# Patient Record
Sex: Female | Born: 1970 | Race: Black or African American | Hispanic: No | Marital: Married | Smoking: Never smoker
Health system: Southern US, Community
[De-identification: ages and names within clinical notes are randomized; demographics above are authoritative.]

## PROBLEM LIST (undated history)

## (undated) DIAGNOSIS — D573 Sickle-cell trait: Secondary | ICD-10-CM

## (undated) DIAGNOSIS — E785 Hyperlipidemia, unspecified: Secondary | ICD-10-CM

## (undated) DIAGNOSIS — D649 Anemia, unspecified: Secondary | ICD-10-CM

## (undated) DIAGNOSIS — T7840XA Allergy, unspecified, initial encounter: Secondary | ICD-10-CM

## (undated) DIAGNOSIS — Z9289 Personal history of other medical treatment: Secondary | ICD-10-CM

## (undated) DIAGNOSIS — N920 Excessive and frequent menstruation with regular cycle: Secondary | ICD-10-CM

## (undated) DIAGNOSIS — I714 Abdominal aortic aneurysm, without rupture, unspecified: Secondary | ICD-10-CM

## (undated) DIAGNOSIS — I10 Essential (primary) hypertension: Secondary | ICD-10-CM

## (undated) DIAGNOSIS — M1990 Unspecified osteoarthritis, unspecified site: Secondary | ICD-10-CM

## (undated) DIAGNOSIS — R51 Headache: Secondary | ICD-10-CM

## (undated) DIAGNOSIS — R569 Unspecified convulsions: Secondary | ICD-10-CM

## (undated) HISTORY — DX: Unspecified osteoarthritis, unspecified site: M19.90

## (undated) HISTORY — DX: Excessive and frequent menstruation with regular cycle: N92.0

## (undated) HISTORY — DX: Abdominal aortic aneurysm, without rupture, unspecified: I71.40

## (undated) HISTORY — DX: Essential (primary) hypertension: I10

## (undated) HISTORY — DX: Sickle-cell trait: D57.3

## (undated) HISTORY — PX: WISDOM TOOTH EXTRACTION: SHX21

## (undated) HISTORY — DX: Unspecified convulsions: R56.9

## (undated) HISTORY — DX: Hyperlipidemia, unspecified: E78.5

## (undated) HISTORY — DX: Allergy, unspecified, initial encounter: T78.40XA

---

## 1998-08-08 ENCOUNTER — Inpatient Hospital Stay (HOSPITAL_COMMUNITY): Admission: AD | Admit: 1998-08-08 | Discharge: 1998-08-10 | Payer: Self-pay | Admitting: Obstetrics and Gynecology

## 1998-11-13 ENCOUNTER — Encounter: Payer: Self-pay | Admitting: Emergency Medicine

## 1998-11-13 ENCOUNTER — Emergency Department (HOSPITAL_COMMUNITY): Admission: EM | Admit: 1998-11-13 | Discharge: 1998-11-13 | Payer: Self-pay | Admitting: Emergency Medicine

## 2009-04-21 ENCOUNTER — Ambulatory Visit: Payer: Self-pay | Admitting: Diagnostic Radiology

## 2010-02-18 ENCOUNTER — Emergency Department (HOSPITAL_BASED_OUTPATIENT_CLINIC_OR_DEPARTMENT_OTHER): Admission: EM | Admit: 2010-02-18 | Discharge: 2009-04-21 | Payer: Self-pay | Admitting: Emergency Medicine

## 2010-06-03 LAB — CBC
HCT: 36.3 % (ref 36.0–46.0)
Hemoglobin: 12.1 g/dL (ref 12.0–15.0)
MCHC: 33.5 g/dL (ref 30.0–36.0)
MCV: 78.8 fL (ref 78.0–100.0)
Platelets: 292 10*3/uL (ref 150–400)
RBC: 4.61 MIL/uL (ref 3.87–5.11)
RDW: 16 % — ABNORMAL HIGH (ref 11.5–15.5)
WBC: 5.2 10*3/uL (ref 4.0–10.5)

## 2010-06-03 LAB — BASIC METABOLIC PANEL
BUN: 14 mg/dL (ref 6–23)
CO2: 29 mEq/L (ref 19–32)
Calcium: 8.4 mg/dL (ref 8.4–10.5)
Chloride: 106 mEq/L (ref 96–112)
Creatinine, Ser: 0.9 mg/dL (ref 0.4–1.2)
GFR calc Af Amer: >60 mL/min (ref >60)
GFR calc non Af Amer: >60 mL/min (ref >60)
Glucose, Bld: 86 mg/dL (ref 70–99)
Potassium: 3.5 mEq/L (ref 3.5–5.1)
Sodium: 141 mEq/L (ref 135–145)

## 2010-06-03 LAB — POCT CARDIAC MARKERS
CKMB, poc: <1 ng/mL — ABNORMAL LOW (ref 1.0–8.0)
CKMB, poc: <1 ng/mL — ABNORMAL LOW (ref 1.0–8.0)
Myoglobin, poc: 27 ng/mL (ref 12–200)
Myoglobin, poc: 51.3 ng/mL (ref 12–200)
Troponin i, poc: <0.05 ng/mL (ref 0.00–0.09)
Troponin i, poc: <0.05 ng/mL (ref 0.00–0.09)

## 2011-02-15 ENCOUNTER — Other Ambulatory Visit: Payer: Self-pay | Admitting: Obstetrics and Gynecology

## 2011-02-15 DIAGNOSIS — Z1231 Encounter for screening mammogram for malignant neoplasm of breast: Secondary | ICD-10-CM

## 2011-03-21 ENCOUNTER — Ambulatory Visit
Admission: RE | Admit: 2011-03-21 | Discharge: 2011-03-21 | Disposition: A | Discharge status: Home / Self Care | Payer: BC Managed Care – PPO | Source: Ambulatory Visit | Attending: Obstetrics and Gynecology | Admitting: Obstetrics and Gynecology

## 2011-03-21 DIAGNOSIS — Z1231 Encounter for screening mammogram for malignant neoplasm of breast: Secondary | ICD-10-CM

## 2012-01-20 ENCOUNTER — Telehealth: Payer: Self-pay | Admitting: Obstetrics and Gynecology

## 2012-01-20 NOTE — Telephone Encounter (Signed)
Tc from pt. Pt c/o heavier menses x 6-8 months. No fever. Pt seen by PCP on yesterday and told has iron level=6. Pt was told to follow up with gyn physician due to heavy menses. Pt was not put on iron supp. AEX sched 02/27/12@9 :45 with AVS. Informed pt to consult with PCP per iron supp recs. Pt voices understanding. Told pt to cb if starts changing >1 pad hourly. Pt agrees.

## 2012-01-20 NOTE — Telephone Encounter (Signed)
Lm on vm to cb per telephone call.  

## 2012-02-07 ENCOUNTER — Telehealth: Payer: Self-pay | Admitting: Obstetrics and Gynecology

## 2012-02-07 NOTE — Telephone Encounter (Signed)
Returned pt's call. LM to return call.   

## 2012-02-08 NOTE — Telephone Encounter (Signed)
Returned pt's call. LM to return call.   

## 2012-02-27 ENCOUNTER — Ambulatory Visit: Payer: Self-pay | Admitting: Obstetrics and Gynecology

## 2012-02-29 ENCOUNTER — Ambulatory Visit (INDEPENDENT_AMBULATORY_CARE_PROVIDER_SITE_OTHER): Payer: BC Managed Care – PPO

## 2012-02-29 VITALS — BP 130/88 | Ht 70.0 in | Wt 186.0 lb

## 2012-02-29 DIAGNOSIS — D649 Anemia, unspecified: Secondary | ICD-10-CM

## 2012-02-29 DIAGNOSIS — N920 Excessive and frequent menstruation with regular cycle: Secondary | ICD-10-CM

## 2012-02-29 DIAGNOSIS — Z01419 Encounter for gynecological examination (general) (routine) without abnormal findings: Secondary | ICD-10-CM

## 2012-02-29 LAB — THYROID PANEL WITH TSH: TSH: 1.336 u[IU]/mL (ref 0.350–4.500)

## 2012-02-29 LAB — IRON AND TIBC
%SAT: 80 % — ABNORMAL HIGH (ref 20–55)
Iron: 340 ug/dL — ABNORMAL HIGH (ref 42–145)

## 2012-02-29 LAB — FERRITIN: Ferritin: 1 ng/mL — ABNORMAL LOW (ref 10–291)

## 2012-02-29 NOTE — Progress Notes (Signed)
Last Pap: 02/2011  WNL: Yes Regular Periods:no Contraception: condoms   Monthly Breast exam:yes Tetanus<75yrs:no Nl.Bladder Function:yes Daily BMs:yes Healthy Diet:yes Calcium:yes Mammogram:yes Date of Mammogram: 02/15/2011 Exercise:yes Have often Exercise: 2 days a week  Seatbelt: yes Abuse at home: no Stressful work:no PCP: Dr.Aguiar  Change in PMH: unchanged Change in AOZ:HYQMVHQIO  C/o  low iron  .Marland Kitchen Subjective:    Catherine Mcdonald is a 41 y.o. female, G2P2002, who presents for an annual exam. C/o heavier periods over last 6months and PCP placed on iron recently and rec'd pt be seen by OB/GYN to further assess.  Pt has a 16y.o. Daughter and 83 y.o. Son; 1st child vaginal and 2nd child c/s.  No recent HSV outbreaks.  She is on medication for [facility], but she doesn't recall exact name.  Pt is has been tired/sluggish, but no dizziness or syncope.  Sometimes wearing pads and a tampon.  No h/o fibroids or polyps or endometriosis.  Pt is a Therapist, music at [employer]  Hgb=6.9 in 11/'13 at PCP.    History   Social History  . Marital Status: Single    Spouse Name: N/A    Number of Children: N/A  . Years of Education: N/A   Social History Main Topics  . Smoking status: Never Smoker   . Smokeless tobacco: Never Used  . Alcohol Use: Yes  . Drug Use: No  . Sexually Active: Yes    Birth Control/ Protection: Condom   Other Topics Concern  . None   Social History Narrative  . None    Menstrual cycle:   LMP: Patient's last menstrual period was 01/23/2012.             The following portions of the patient's history were reviewed and updated as appropriate: allergies, current medications, past family history, past medical history, past social history, past surgical history and problem list.  Review of Systems Pertinent items are noted in HPI. Breast:Negative for breast lump,nipple discharge or nipple retraction Gastrointestinal: Negative for abdominal pain, change in  bowel habits or rectal bleeding Urinary:negative   Objective:    BP 130/88  Ht 5\' 10"  (1.778 m)  Wt 186 lb (84.369 kg)  BMI 26.69 kg/m2  LMP 01/23/2012    Weight:  Wt Readings from Last 1 Encounters:  03/01/12 188 lb (85.276 kg)          BMI: Body mass index is 26.69 kg/(m^2).  General Appearance: Alert, appropriate appearance for age. No acute distress HEENT: Grossly normal Neck / Thyroid: Supple, no masses, thyroid w/ slight enlargement Lungs: clear to auscultation bilaterally Back: No CVA tenderness Breast Exam: No masses or nodes.No dimpling, nipple retraction or discharge. Cardiovascular: Regular rate and rhythm. S1, S2, no murmur Gastrointestinal: Soft, non-tender, no masses or organomegaly Pelvic Exam: External genitalia: normal general appearance Vaginal: normal mucosa without prolapse or lesions and normal without tenderness, induration or masses Cervix: nabothian cysts vs small fibroids on surface of cx Adnexa: normal bimanual exam Uterus: slightly enlarged Rectovaginal: not indicated and not done Lymphatic Exam: Non-palpable nodes in neck, clavicular, axillary, or inguinal regions Skin: no rash or abnormalities Neurologic: Normal gait and speech, no tremor  Psychiatric: Alert and oriented, appropriate affect.  .. Results for orders placed in visit on 02/29/12 (from the past 48 hour(s))  PAP IG W/ RFLX HPV ASCU     Status: Normal   Collection Time   02/29/12 12:07 PM      Component Value Range Comment  Specimen adequacy:     SATISFACTORY.  Endocervical/transformation zone component present.   FINAL DIAGNOSIS:        COMMENTS:     Benign cellular changes are present.   Cytotechnologist:       FERRITIN     Status: Abnormal   Collection Time   02/29/12 12:08 PM      Component Value Range Comment   Ferritin 1 (*) 10 - 291 ng/mL   IRON AND TIBC     Status: Abnormal   Collection Time   02/29/12 12:08 PM      Component Value Range Comment   Iron 340 (*) 42 - 145  ug/dL    UIBC 85 (*) 960 - 454 ug/dL    TIBC 098  119 - 147 ug/dL    %SAT 80 (*) 20 - 55 %   THYROID PANEL WITH TSH     Status: Normal   Collection Time   02/29/12 12:08 PM      Component Value Range Comment   T4, Total 9.1  5.0 - 12.5 ug/dL    T3 Uptake 82.9  56.2 - 37.0 %    Free Thyroxine Index 3.0  1.0 - 3.9    TSH 1.336  0.350 - 4.500 uIU/mL      UPT: Not done   Assessment:    menorrhagia w/ anemia   CHTN  Plan:    pap smear STD screening: declined Thyroid panel, Iron panel. RTO ASAP for pelvic u/s and to see MD r/e further management of menorrhagia and anemia. Continue antihypertensive and f/u PCP r/e HTN Rec'd once her current "Polyiron" supply is out, switch to Floridex 2tsp po bid.  Rexene Edison, CNM 03/01/12 1729 (late entry)

## 2012-03-01 ENCOUNTER — Ambulatory Visit (INDEPENDENT_AMBULATORY_CARE_PROVIDER_SITE_OTHER): Payer: BC Managed Care – PPO

## 2012-03-01 ENCOUNTER — Encounter: Payer: Self-pay | Admitting: Obstetrics and Gynecology

## 2012-03-01 ENCOUNTER — Ambulatory Visit (INDEPENDENT_AMBULATORY_CARE_PROVIDER_SITE_OTHER): Payer: BC Managed Care – PPO | Admitting: Obstetrics and Gynecology

## 2012-03-01 ENCOUNTER — Other Ambulatory Visit: Payer: Self-pay

## 2012-03-01 VITALS — BP 120/72 | Ht 70.0 in | Wt 188.0 lb

## 2012-03-01 DIAGNOSIS — N920 Excessive and frequent menstruation with regular cycle: Secondary | ICD-10-CM

## 2012-03-01 DIAGNOSIS — D649 Anemia, unspecified: Secondary | ICD-10-CM | POA: Insufficient documentation

## 2012-03-01 DIAGNOSIS — D259 Leiomyoma of uterus, unspecified: Secondary | ICD-10-CM

## 2012-03-01 DIAGNOSIS — D219 Benign neoplasm of connective and other soft tissue, unspecified: Secondary | ICD-10-CM

## 2012-03-01 HISTORY — DX: Excessive and frequent menstruation with regular cycle: N92.0

## 2012-03-01 LAB — PAP IG W/ RFLX HPV ASCU

## 2012-03-01 MED ORDER — NORETHINDRONE ACET-ETHINYL EST 1-20 MG-MCG PO TABS: 1.0000 | ORAL_TABLET | Freq: Every day | ORAL | Status: DC

## 2012-03-01 NOTE — Progress Notes (Signed)
Subjective:    Catherine Mcdonald is a 41 y.o. female, G2P2002, who presents for Gyn ultrasound because of Anemia and Menorrhagia  Cycle monthly, 9 days with 7 heavy days, 1 pad every 1 to 2 hours, clots ad 3 cm, no dysmenorrhea. Flow has increased in the last 6 months.  20 yo daughter and 73 yo daughter: SVD 6 lbs 9 oz and C/S 9 lbs 15 oz.  Condoms: satisfied Does not desire other pregnancy. Non-smoker, no DVT, previous BCP user without problem.  The following portions of the patient's history were reviewed and updated as appropriate: allergies, current medications, past family history.  Objective:    LMP 01/23/2012    Weight:  Wt Readings from Last 1 Encounters:  02/29/12 186 lb (84.369 kg)          BMI: There is no height or weight on file to calculate BMI.  ULTRASOUND: Uterus enlarged    Adnexa normal    Endometrium 0.980 mm    Free fluid: no    Other findings:  Enlarged uterus with multiple fibroids. Largest measured;  Fibroid 1: left posterior (myometrial) 3.0 x 2.8 x 3.1 cm Fibroid 2: mid - this appears to be submucosal: color flow is noted 2.2 x 2.1 x 2.1 cm Fibroid 3: Fundus (myometrial) 1.9 x 1.6 x 1.7 cm RTOV: WNLs - resolving CL LTOV: WNLs No CDS fluid.   Assessment:    symptomatic fibroids    Plan:    The following was reviewed with patient:    Benign nature of fibroids as well as unpredictable growth during reproductive years.      Possible fibroid symptoms including: menorrhagia, dysmenorrhea, urinary frequency,       pelvic pain, and back pain.     Expected resolution/improvement of symptoms with menopausal state.    Treatment options: expectant management, NSAIDS, oral contraceptives,       Depo Provera, Uterine Artery Embolization, myomectomy, and hysterectomy.  Surgical approach options were also reviewed including, laparoscopic, robotically assisted and abdominal. Risks, benefits and limitations also discussed.  Choice between total and Supracervical  also discussed with the theoretical benefit of reduced long-term prolapse but with the possible risks of: cyclic bleeding, risk of new growth of cervical fibroid, risk of cervical cancer and need to maintain scheduled Pap smears. Prophylactic BSO discussed with risks and benefits.  After 25  minutes face to face encounter, the patient requests to proceed with trial of BCP    Silverio Lay MD

## 2012-05-12 DIAGNOSIS — Z9289 Personal history of other medical treatment: Secondary | ICD-10-CM

## 2012-05-12 HISTORY — DX: Personal history of other medical treatment: Z92.89

## 2012-05-24 ENCOUNTER — Inpatient Hospital Stay (HOSPITAL_COMMUNITY)
Admission: AD | Admit: 2012-05-24 | Disposition: A | Discharge status: Home / Self Care | Payer: BC Managed Care – PPO | Source: Ambulatory Visit | Attending: Obstetrics and Gynecology | Admitting: Obstetrics and Gynecology

## 2012-05-24 ENCOUNTER — Inpatient Hospital Stay: Admission: AD | Admit: 2012-05-24 | Payer: Self-pay | Source: Ambulatory Visit | Admitting: Obstetrics and Gynecology

## 2012-05-24 ENCOUNTER — Encounter (HOSPITAL_COMMUNITY): Payer: Self-pay | Admitting: General Practice

## 2012-05-24 ENCOUNTER — Telehealth: Payer: Self-pay | Admitting: Obstetrics and Gynecology

## 2012-05-24 ENCOUNTER — Ambulatory Visit (HOSPITAL_COMMUNITY): Admission: AD | Admit: 2012-05-24 | Payer: BC Managed Care – PPO | Admitting: Obstetrics and Gynecology

## 2012-05-24 HISTORY — DX: Anemia, unspecified: D64.9

## 2012-05-24 NOTE — Telephone Encounter (Signed)
TC to [place], at [facility]. States pt was admitted under DR AVS name but pt states received call from cardiologist 05/24/11 that needed blood transfusion. Was seen at [facility]. Informed that DR AVS was not aware of admission. States will contact pt's PCP.

## 2012-06-25 ENCOUNTER — Encounter (HOSPITAL_COMMUNITY): Payer: Self-pay

## 2012-06-25 ENCOUNTER — Encounter (HOSPITAL_COMMUNITY): Payer: Self-pay | Admitting: Pharmacy Technician

## 2012-06-25 ENCOUNTER — Encounter (HOSPITAL_COMMUNITY)
Admission: RE | Admit: 2012-06-25 | Discharge: 2012-06-25 | Disposition: A | Discharge status: Home / Self Care | Payer: BC Managed Care – PPO | Source: Ambulatory Visit | Attending: Obstetrics and Gynecology | Admitting: Obstetrics and Gynecology

## 2012-06-25 DIAGNOSIS — Z01812 Encounter for preprocedural laboratory examination: Secondary | ICD-10-CM | POA: Insufficient documentation

## 2012-06-25 DIAGNOSIS — Z01818 Encounter for other preprocedural examination: Secondary | ICD-10-CM | POA: Insufficient documentation

## 2012-06-25 HISTORY — DX: Headache: R51

## 2012-06-25 HISTORY — DX: Personal history of other medical treatment: Z92.89

## 2012-06-25 LAB — SURGICAL PCR SCREEN: Staphylococcus aureus: INVALID — AB

## 2012-06-25 LAB — CBC
HCT: 25.2 % — ABNORMAL LOW (ref 36.0–46.0)
MCV: 63 fL — ABNORMAL LOW (ref 78.0–100.0)
RBC: 4 MIL/uL (ref 3.87–5.11)
WBC: 4.2 10*3/uL (ref 4.0–10.5)

## 2012-06-25 NOTE — Patient Instructions (Addendum)
   Your procedure is scheduled ZO:[id number] May 1st  Enter through the Main Entrance of [facility] at:8:30am Pick up the phone at the desk and dial [phone] and inform us of your arrival.  Please call this number if you have any problems the morning of surgery: [phone]  Remember: Do not eat or drink anything after midnight on Wednesday   Do not wear jewelry, make-up, or FINGER nail polish No metal in your hair or on your body. Do not wear lotions, powders, perfumes. You may wear deodorant.  Please use your CHG wash as directed prior to surgery.  Do not shave anywhere for at least 12 hours prior to first CHG shower.  Do not bring valuables to the hospital.   Leave suitcase in the car. After Surgery it may be brought to your room. For patients being admitted to the hospital, checkout time is 11:00am the day of discharge.

## 2012-06-25 NOTE — Pre-Procedure Instructions (Signed)
Pt had ekg/stress test/echo done in March 2014 at [facility] in [facility] with Dr Gracelyn Nurse F Al-Khori-requested records. Pt states was told no cardiac problem-done d/t anemia symptoms.

## 2012-06-25 NOTE — Pre-Procedure Instructions (Signed)
hgb returned at 7.0 from pat visit today. Received EKG/Stress/ECHO from [facility] via FAX=reviewed with Dr Linden Dolin orders to [facility] 2 units dos. Notified Adrianne Pridgen at Dr Debria Garret office.  Will call Dr Stefano Gaul today (on Call) and notify him.

## 2012-06-27 LAB — MRSA CULTURE

## 2012-06-28 ENCOUNTER — Other Ambulatory Visit: Payer: Self-pay | Admitting: Obstetrics and Gynecology

## 2012-07-11 ENCOUNTER — Ambulatory Visit (HOSPITAL_COMMUNITY)
Admission: AD | Admit: 2012-07-11 | Discharge: 2012-07-11 | Disposition: A | Discharge status: Home / Self Care | Payer: BC Managed Care – PPO | Source: Ambulatory Visit | Attending: Obstetrics and Gynecology | Admitting: Obstetrics and Gynecology

## 2012-07-11 ENCOUNTER — Other Ambulatory Visit: Payer: Self-pay

## 2012-07-11 ENCOUNTER — Telehealth: Payer: Self-pay | Admitting: Obstetrics and Gynecology

## 2012-07-11 DIAGNOSIS — D649 Anemia, unspecified: Secondary | ICD-10-CM

## 2012-07-11 DIAGNOSIS — D500 Iron deficiency anemia secondary to blood loss (chronic): Secondary | ICD-10-CM | POA: Insufficient documentation

## 2012-07-11 DIAGNOSIS — N920 Excessive and frequent menstruation with regular cycle: Secondary | ICD-10-CM | POA: Insufficient documentation

## 2012-07-11 NOTE — H&P (Signed)
  Admission History and Physical Exam for a Gynecology Patient  Ms. Catherine Mcdonald is a 42 y.o. female, G2P2002, who presents for a vaginal hysterectomy. She has been followed at the [facility] and Gynecology division of [facility]. She has fibroids, menorrhagia, and severe anemia.  Her hemoglobin is 7.0.  She has had iron transfusions in the past.  Her endometrial biopsy is negative.  Her most recent Pap smear was within normal limits.  Hormonal therapy has not relieved her bleeding.  OB History   Grav Para Term Preterm Abortions TAB SAB Ect Mult Living   2 2 2       2       Past Medical History  Diagnosis Date  . Menorrhagia 03/01/2012  . Hypertension     meds d/c about 1 year ago after starting exercise program with b/p controlled-pcp aware  . Headache   . History of blood transfusion 05/2012    [facility]  . Anemia     had ekg/stress test/echo done in March at [facility] d/t symptoms of anemia-cardiac r/o per pt-records requested    No prescriptions prior to admission    Past Surgical History  Procedure Laterality Date  . Wisdom tooth extraction    . Cesarean section      No Known Allergies  Family History: family history includes Diabetes in her maternal grandmother and Hypertension in her maternal grandmother.  Social History:  reports that she has never smoked. She has never used smokeless tobacco. She reports that  drinks alcohol. She reports that she does not use illicit drugs.  Review of systems: See HPI.  Admission Physical Exam:    There is no weight on file to calculate BMI.  There were no vitals taken for this visit.  HEENT:                 Within normal limits Chest:                   Clear Heart:                    Regular rate and rhythm Breasts:                No masses, skin changes, bleeding, or discharge present Abdomen:             Nontender, no masses Extremities:          Grossly  normal Neurologic exam: Grossly normal  Pelvic exam:  External genitalia: normal general appearance Vaginal: normal without tenderness, induration or masses Cervix: normal appearance Adnexa: normal bimanual exam Uterus: 10 week size, irregular  Assessment:  Fibroids Menorrhagia Severe anemia  Plan:  The patient will undergo a vaginal hysterectomy.  She understands the indications for her procedure.  She accepts the risk of, but not limited to, anesthetic complications, bleeding, infection, and possible damage to surrounding organs.  We will have 2 units of packed red blood cells available.   Janine Limbo 07/11/2012

## 2012-07-11 NOTE — Telephone Encounter (Signed)
Pt called. Cross march and surgery discussed. Dr. Stefano Gaul

## 2012-07-12 ENCOUNTER — Encounter (HOSPITAL_COMMUNITY): Payer: Self-pay | Admitting: Anesthesiology

## 2012-07-12 ENCOUNTER — Encounter (HOSPITAL_COMMUNITY)
Admission: RE | Disposition: A | Discharge status: Home / Self Care | Payer: Self-pay | Source: Ambulatory Visit | Attending: Obstetrics and Gynecology

## 2012-07-12 ENCOUNTER — Observation Stay (HOSPITAL_COMMUNITY)
Admission: RE | Admit: 2012-07-12 | Discharge: 2012-07-13 | Disposition: A | Discharge status: Home / Self Care | Payer: BC Managed Care – PPO | Source: Ambulatory Visit | Attending: Obstetrics and Gynecology | Admitting: Obstetrics and Gynecology

## 2012-07-12 ENCOUNTER — Ambulatory Visit (HOSPITAL_COMMUNITY): Payer: BC Managed Care – PPO | Admitting: Anesthesiology

## 2012-07-12 ENCOUNTER — Encounter (HOSPITAL_COMMUNITY): Payer: Self-pay | Admitting: Certified Registered"

## 2012-07-12 DIAGNOSIS — N920 Excessive and frequent menstruation with regular cycle: Secondary | ICD-10-CM | POA: Insufficient documentation

## 2012-07-12 DIAGNOSIS — I10 Essential (primary) hypertension: Secondary | ICD-10-CM | POA: Insufficient documentation

## 2012-07-12 DIAGNOSIS — D250 Submucous leiomyoma of uterus: Principal | ICD-10-CM | POA: Insufficient documentation

## 2012-07-12 DIAGNOSIS — D252 Subserosal leiomyoma of uterus: Secondary | ICD-10-CM | POA: Insufficient documentation

## 2012-07-12 DIAGNOSIS — D6489 Other specified anemias: Secondary | ICD-10-CM | POA: Insufficient documentation

## 2012-07-12 DIAGNOSIS — D251 Intramural leiomyoma of uterus: Secondary | ICD-10-CM | POA: Insufficient documentation

## 2012-07-12 DIAGNOSIS — N72 Inflammatory disease of cervix uteri: Secondary | ICD-10-CM | POA: Insufficient documentation

## 2012-07-12 HISTORY — PX: VAGINAL HYSTERECTOMY: SHX2639

## 2012-07-12 LAB — CBC
HCT: 24 % — ABNORMAL LOW (ref 36.0–46.0)
HCT: 20.8 % — ABNORMAL LOW (ref 36.0–46.0)
Hemoglobin: 6.3 g/dL — CL (ref 12.0–15.0)
Hemoglobin: 5.6 g/dL — CL (ref 12.0–15.0)
MCH: 16.7 pg — ABNORMAL LOW (ref 26.0–34.0)
MCH: 16.8 pg — ABNORMAL LOW (ref 26.0–34.0)
MCHC: 26.3 g/dL — ABNORMAL LOW (ref 30.0–36.0)
MCHC: 26.9 g/dL — ABNORMAL LOW (ref 30.0–36.0)
MCV: 63.5 fL — ABNORMAL LOW (ref 78.0–100.0)
MCV: 62.3 fL — ABNORMAL LOW (ref 78.0–100.0)
Platelets: 423 K/uL — ABNORMAL HIGH (ref 150–400)
RBC: 3.78 MIL/uL — ABNORMAL LOW (ref 3.87–5.11)
RDW: 26 % — ABNORMAL HIGH (ref 11.5–15.5)
WBC: 7.4 K/uL (ref 4.0–10.5)

## 2012-07-12 LAB — PREGNANCY, URINE: Preg Test, Ur: NEGATIVE

## 2012-07-12 SURGERY — HYSTERECTOMY, VAGINAL
Anesthesia: General | Site: Vagina | Wound class: Clean Contaminated

## 2012-07-12 MED ORDER — CEFAZOLIN SODIUM-DEXTROSE 2-3 GM-% IV SOLR
INTRAVENOUS | Status: AC
  Filled 2012-07-12: qty 50

## 2012-07-12 MED ORDER — PHENYLEPHRINE HCL 10 MG/ML IJ SOLN
INTRAMUSCULAR | Status: DC | PRN
  Administered 2012-07-12: .04 mg via INTRAVENOUS
  Administered 2012-07-12: .08 mg via INTRAVENOUS
  Administered 2012-07-12 (×2): .04 mg via INTRAVENOUS

## 2012-07-12 MED ORDER — FENTANYL CITRATE 0.05 MG/ML IJ SOLN
INTRAMUSCULAR | Status: AC
  Filled 2012-07-12: qty 5

## 2012-07-12 MED ORDER — GLYCOPYRROLATE 0.2 MG/ML IJ SOLN
INTRAMUSCULAR | Status: DC | PRN
  Administered 2012-07-12: 0.6 mg via INTRAVENOUS

## 2012-07-12 MED ORDER — KETOROLAC TROMETHAMINE 30 MG/ML IJ SOLN: 15.0000 mg | Freq: Once | INTRAMUSCULAR | Status: DC | PRN

## 2012-07-12 MED ORDER — SODIUM CHLORIDE 0.9 % IJ SOLN: 9.0000 mL | INTRAMUSCULAR | Status: DC | PRN

## 2012-07-12 MED ORDER — LACTATED RINGERS IV SOLN
INTRAVENOUS | Status: DC
  Administered 2012-07-12 (×3): via INTRAVENOUS

## 2012-07-12 MED ORDER — BUPIVACAINE-EPINEPHRINE (PF) 0.5% -1:200000 IJ SOLN
INTRAMUSCULAR | Status: AC
  Filled 2012-07-12: qty 10

## 2012-07-12 MED ORDER — CEFAZOLIN SODIUM-DEXTROSE 2-3 GM-% IV SOLR
2.0000 g | INTRAVENOUS | Status: AC
  Administered 2012-07-12: 2 g via INTRAVENOUS

## 2012-07-12 MED ORDER — MIDAZOLAM HCL 2 MG/2ML IJ SOLN: 0.5000 mg | Freq: Once | INTRAMUSCULAR | Status: DC | PRN

## 2012-07-12 MED ORDER — FENTANYL CITRATE 0.05 MG/ML IJ SOLN
INTRAMUSCULAR | Status: DC | PRN
  Administered 2012-07-12 (×3): 50 ug via INTRAVENOUS
  Administered 2012-07-12: 100 ug via INTRAVENOUS

## 2012-07-12 MED ORDER — DIPHENHYDRAMINE HCL 12.5 MG/5ML PO ELIX
12.5000 mg | ORAL_SOLUTION | Freq: Four times a day (QID) | ORAL | Status: DC | PRN
  Administered 2012-07-12: 12.5 mg via ORAL
  Filled 2012-07-12: qty 5

## 2012-07-12 MED ORDER — KETOROLAC TROMETHAMINE 30 MG/ML IJ SOLN
INTRAMUSCULAR | Status: DC | PRN
  Administered 2012-07-12: 30 mg via INTRAVENOUS
  Administered 2012-07-12: 30 mg via INTRAMUSCULAR

## 2012-07-12 MED ORDER — LIDOCAINE HCL (CARDIAC) 20 MG/ML IV SOLN
INTRAVENOUS | Status: DC | PRN
  Administered 2012-07-12: 50 mg via INTRAVENOUS

## 2012-07-12 MED ORDER — FENTANYL CITRATE 0.05 MG/ML IJ SOLN
25.0000 ug | INTRAMUSCULAR | Status: DC | PRN
  Administered 2012-07-12: 50 ug via INTRAVENOUS

## 2012-07-12 MED ORDER — ACETAMINOPHEN 160 MG/5ML PO SOLN
ORAL | Status: AC
  Administered 2012-07-12: 975 mg via ORAL
  Filled 2012-07-12: qty 20.3

## 2012-07-12 MED ORDER — FENTANYL CITRATE 0.05 MG/ML IJ SOLN
INTRAMUSCULAR | Status: AC
  Administered 2012-07-12: 50 ug via INTRAVENOUS
  Filled 2012-07-12: qty 2

## 2012-07-12 MED ORDER — 0.9 % SODIUM CHLORIDE (POUR BTL) OPTIME
TOPICAL | Status: DC | PRN
  Administered 2012-07-12: 1000 mL

## 2012-07-12 MED ORDER — LIDOCAINE HCL (CARDIAC) 20 MG/ML IV SOLN
INTRAVENOUS | Status: AC
  Filled 2012-07-12: qty 5

## 2012-07-12 MED ORDER — ONDANSETRON HCL 4 MG/2ML IJ SOLN
INTRAMUSCULAR | Status: DC | PRN
  Administered 2012-07-12: 4 mg via INTRAVENOUS

## 2012-07-12 MED ORDER — NEOSTIGMINE METHYLSULFATE 1 MG/ML IJ SOLN
INTRAMUSCULAR | Status: DC | PRN
  Administered 2012-07-12: 3 mg via INTRAVENOUS

## 2012-07-12 MED ORDER — PHENYLEPHRINE 40 MCG/ML (10ML) SYRINGE FOR IV PUSH (FOR BLOOD PRESSURE SUPPORT)
PREFILLED_SYRINGE | INTRAVENOUS | Status: AC
  Filled 2012-07-12: qty 10

## 2012-07-12 MED ORDER — DIPHENHYDRAMINE HCL 50 MG/ML IJ SOLN: 12.5000 mg | Freq: Four times a day (QID) | INTRAMUSCULAR | Status: DC | PRN

## 2012-07-12 MED ORDER — ROCURONIUM BROMIDE 100 MG/10ML IV SOLN
INTRAVENOUS | Status: DC | PRN
  Administered 2012-07-12: 50 mg via INTRAVENOUS
  Administered 2012-07-12: 5 mg via INTRAVENOUS

## 2012-07-12 MED ORDER — HYDROCODONE-ACETAMINOPHEN 5-325 MG PO TABS
1.0000 | ORAL_TABLET | ORAL | Status: DC | PRN
  Administered 2012-07-13: 2 via ORAL
  Filled 2012-07-12: qty 2

## 2012-07-12 MED ORDER — DEXAMETHASONE SODIUM PHOSPHATE 4 MG/ML IJ SOLN
INTRAMUSCULAR | Status: DC | PRN
  Administered 2012-07-12: 10 mg via INTRAVENOUS

## 2012-07-12 MED ORDER — KETOROLAC TROMETHAMINE 30 MG/ML IJ SOLN
30.0000 mg | Freq: Four times a day (QID) | INTRAMUSCULAR | Status: DC
  Administered 2012-07-12 – 2012-07-13 (×2): 30 mg via INTRAVENOUS
  Filled 2012-07-12 (×2): qty 1

## 2012-07-12 MED ORDER — PROMETHAZINE HCL 25 MG/ML IJ SOLN: 6.2500 mg | INTRAMUSCULAR | Status: DC | PRN

## 2012-07-12 MED ORDER — BUPIVACAINE-EPINEPHRINE 0.5% -1:200000 IJ SOLN
INTRAMUSCULAR | Status: DC | PRN
  Administered 2012-07-12: 10 mL

## 2012-07-12 MED ORDER — MEPERIDINE HCL 25 MG/ML IJ SOLN: 6.2500 mg | INTRAMUSCULAR | Status: DC | PRN

## 2012-07-12 MED ORDER — MENTHOL 3 MG MT LOZG: 1.0000 | LOZENGE | OROMUCOSAL | Status: DC | PRN

## 2012-07-12 MED ORDER — MIDAZOLAM HCL 2 MG/2ML IJ SOLN
INTRAMUSCULAR | Status: AC
  Filled 2012-07-12: qty 2

## 2012-07-12 MED ORDER — DEXAMETHASONE SODIUM PHOSPHATE 10 MG/ML IJ SOLN
INTRAMUSCULAR | Status: AC
  Filled 2012-07-12: qty 1

## 2012-07-12 MED ORDER — ONDANSETRON HCL 4 MG/2ML IJ SOLN
INTRAMUSCULAR | Status: AC
  Filled 2012-07-12: qty 2

## 2012-07-12 MED ORDER — MIDAZOLAM HCL 5 MG/5ML IJ SOLN
INTRAMUSCULAR | Status: DC | PRN
  Administered 2012-07-12: 2 mg via INTRAVENOUS

## 2012-07-12 MED ORDER — VASOPRESSIN 20 UNIT/ML IJ SOLN
INTRAMUSCULAR | Status: AC
  Filled 2012-07-12: qty 1

## 2012-07-12 MED ORDER — ACETAMINOPHEN 160 MG/5ML PO SOLN: 975.0000 mg | Freq: Once | ORAL | Status: AC

## 2012-07-12 MED ORDER — HYDROMORPHONE 0.3 MG/ML IV SOLN
INTRAVENOUS | Status: DC
  Administered 2012-07-12: 0.9 mg via INTRAVENOUS
  Administered 2012-07-12: 15:00:00 via INTRAVENOUS
  Administered 2012-07-12: 3 mg via INTRAVENOUS
  Administered 2012-07-13: 0.6 mg via INTRAVENOUS
  Filled 2012-07-12: qty 25

## 2012-07-12 MED ORDER — PROMETHAZINE HCL 25 MG PO TABS: 12.5000 mg | ORAL_TABLET | Freq: Four times a day (QID) | ORAL | Status: DC | PRN

## 2012-07-12 MED ORDER — ROCURONIUM BROMIDE 50 MG/5ML IV SOLN
INTRAVENOUS | Status: AC
  Filled 2012-07-12: qty 1

## 2012-07-12 MED ORDER — PROPOFOL 10 MG/ML IV EMUL
INTRAVENOUS | Status: AC
  Filled 2012-07-12: qty 20

## 2012-07-12 MED ORDER — ONDANSETRON HCL 4 MG/2ML IJ SOLN
4.0000 mg | Freq: Four times a day (QID) | INTRAMUSCULAR | Status: DC | PRN
  Administered 2012-07-13: 4 mg via INTRAVENOUS
  Filled 2012-07-12: qty 2

## 2012-07-12 MED ORDER — KETOROLAC TROMETHAMINE 30 MG/ML IJ SOLN
INTRAMUSCULAR | Status: AC
  Filled 2012-07-12: qty 2

## 2012-07-12 MED ORDER — IBUPROFEN 600 MG PO TABS: 600.0000 mg | ORAL_TABLET | Freq: Four times a day (QID) | ORAL | Status: DC | PRN

## 2012-07-12 MED ORDER — LACTATED RINGERS IV SOLN
INTRAVENOUS | Status: DC
  Administered 2012-07-12: 23:00:00 via INTRAVENOUS

## 2012-07-12 MED ORDER — PROPOFOL 10 MG/ML IV EMUL
INTRAVENOUS | Status: DC | PRN
  Administered 2012-07-12: 200 mg via INTRAVENOUS

## 2012-07-12 MED ORDER — EPHEDRINE 5 MG/ML INJ
INTRAVENOUS | Status: AC
  Filled 2012-07-12: qty 10

## 2012-07-12 MED ORDER — NALOXONE HCL 0.4 MG/ML IJ SOLN: 0.4000 mg | INTRAMUSCULAR | Status: DC | PRN

## 2012-07-12 MED ORDER — EPHEDRINE SULFATE 50 MG/ML IJ SOLN
INTRAMUSCULAR | Status: DC | PRN
  Administered 2012-07-12 (×2): 5 mg via INTRAVENOUS

## 2012-07-12 SURGICAL SUPPLY — 28 items
CANISTER SUCTION 2500CC (MISCELLANEOUS) ×2 IMPLANT
CLOTH BEACON ORANGE TIMEOUT ST (SAFETY) ×2 IMPLANT
CONT PATH 16OZ SNAP LID 3702 (MISCELLANEOUS) ×2 IMPLANT
DECANTER SPIKE VIAL GLASS SM (MISCELLANEOUS) ×2 IMPLANT
DRAPE PROXIMA HALF (DRAPES) ×2 IMPLANT
DRESSING TELFA 8X3 (GAUZE/BANDAGES/DRESSINGS) ×2 IMPLANT
GLOVE BIOGEL PI IND STRL 6.5 (GLOVE) ×2 IMPLANT
GLOVE BIOGEL PI IND STRL 8.5 (GLOVE) ×1 IMPLANT
GLOVE BIOGEL PI INDICATOR 6.5 (GLOVE) ×2
GLOVE BIOGEL PI INDICATOR 8.5 (GLOVE) ×1
GLOVE ECLIPSE 8.0 STRL XLNG CF (GLOVE) ×4 IMPLANT
GOWN STRL REIN XL XLG (GOWN DISPOSABLE) ×10 IMPLANT
NEEDLE HYPO 22GX1.5 SAFETY (NEEDLE) IMPLANT
NEEDLE MAYO .5 CIRCLE (NEEDLE) ×2 IMPLANT
NEEDLE SPNL 22GX3.5 QUINCKE BK (NEEDLE) ×2 IMPLANT
NS IRRIG 1000ML POUR BTL (IV SOLUTION) ×2 IMPLANT
PACK VAGINAL WOMENS (CUSTOM PROCEDURE TRAY) ×2 IMPLANT
PAD OB MATERNITY 4.3X12.25 (PERSONAL CARE ITEMS) ×2 IMPLANT
SUT CHROMIC 2 0 TIES 18 (SUTURE) IMPLANT
SUT VIC AB 0 CT1 18XCR BRD8 (SUTURE) ×2 IMPLANT
SUT VIC AB 0 CT1 27 (SUTURE) ×1
SUT VIC AB 0 CT1 27XBRD ANBCTR (SUTURE) ×1 IMPLANT
SUT VIC AB 0 CT1 8-18 (SUTURE) ×4
SUT VICRYL 0 TIES 12 18 (SUTURE) ×2 IMPLANT
SYR TB 1ML 25GX5/8 (SYRINGE) IMPLANT
TOWEL OR 17X24 6PK STRL BLUE (TOWEL DISPOSABLE) ×4 IMPLANT
TRAY FOLEY CATH 14FR (SET/KITS/TRAYS/PACK) ×2 IMPLANT
WATER STERILE IRR 1000ML POUR (IV SOLUTION) IMPLANT

## 2012-07-12 NOTE — Anesthesia Preprocedure Evaluation (Signed)
Anesthesia Evaluation  Patient identified by MRN, date of birth, ID band Patient awake    Reviewed: Allergy & Precautions, H&P , NPO status , Patient's Chart, lab work & pertinent test results, reviewed documented beta blocker date and time   History of Anesthesia Complications Negative for: history of anesthetic complications  Airway Mallampati: III TM Distance: >3 FB Neck ROM: full    Dental  (+) Teeth Intact   Pulmonary neg pulmonary ROS,  breath sounds clear to auscultation  Pulmonary exam normal       Cardiovascular Exercise Tolerance: Good hypertension (was controlling with exercise/ unable to exercise since anemia), Rhythm:regular Rate:Normal     Neuro/Psych  Headaches (menstrual), negative psych ROS   GI/Hepatic negative GI ROS, Neg liver ROS,   Endo/Other  negative endocrine ROS  Renal/GU negative Renal ROS  Female GU complaint     Musculoskeletal   Abdominal   Peds  Hematology  (+) anemia , Anemia from menorrhagia.  Transfusion 4/13 for hgb of 5, increased to 8.  Hgb 6.3 today.  Is typed and crossed for 2 units.  Discussed likelihood of transfusion today.    Anesthesia Other Findings   Reproductive/Obstetrics negative OB ROS                           Anesthesia Physical Anesthesia Plan  ASA: II  Anesthesia Plan: General ETT   Post-op Pain Management:    Induction:   Airway Management Planned:   Additional Equipment:   Intra-op Plan:   Post-operative Plan:   Informed Consent: I have reviewed the patients History and Physical, chart, labs and discussed the procedure including the risks, benefits and alternatives for the proposed anesthesia with the patient or authorized representative who has indicated his/her understanding and acceptance.   Dental Advisory Given  Plan Discussed with: CRNA and Surgeon  Anesthesia Plan Comments:         Anesthesia Quick  Evaluation

## 2012-07-12 NOTE — H&P (Signed)
The patient was interviewed and examined today.  The previously documented history and physical examination was reviewed. There are no changes. The operative procedure was reviewed. The risks and benefits were outlined again. The specific risks include, but are not limited to, anesthetic complications, bleeding, infections, and possible damage to the surrounding organs. The patient's questions were answered.  We are ready to proceed as outlined. The likelihood of the patient achieving the goals of this procedure is very likely.   CBC    Component Value Date/Time   WBC 7.4 07/11/2012 2050   RBC 3.78* 07/11/2012 2050   HGB 6.3* 07/11/2012 2050   HCT 24.0* 07/11/2012 2050   PLT 423* 07/11/2012 2050   MCV 63.5* 07/11/2012 2050   MCH 16.7* 07/11/2012 2050   MCHC 26.3* 07/11/2012 2050   RDW 26.0* 07/11/2012 2050    BP 142/99  Pulse 81  Temp(Src) 98.1 F (36.7 C) (Oral)  SpO2 99%   Leonard Schwartz, M.D.

## 2012-07-12 NOTE — Progress Notes (Signed)
Subjective: Patient reports that she is lightheaded when she rises.  Objective: I have reviewed patient's vital signs and intake and output.  BP 131/66  Pulse 77  Temp(Src) 97.9 F (36.6 C) (Oral)  Resp 12  Ht 5\' 10"  (1.778 m)  Wt 180 lb (81.647 kg)  BMI 25.83 kg/m2  SpO2 100%  CBC    Component Value Date/Time   WBC 7.6 07/12/2012 1245   RBC 3.34* 07/12/2012 1245   HGB 5.6* 07/12/2012 1245   HCT 20.8* 07/12/2012 1245   PLT 338 07/12/2012 1245   MCV 62.3* 07/12/2012 1245   MCH 16.8* 07/12/2012 1245   MCHC 26.9* 07/12/2012 1245   RDW 25.6* 07/12/2012 1245   General: no distress Resp: clear to auscultation bilaterally Cardio: regular rate and rhythm, S1, S2 normal, no murmur, click, rub or gallop GI: soft, non-tender; bowel sounds normal; no masses,  no organomegaly Extremities: extremities normal, atraumatic, no cyanosis or edema Vaginal Bleeding: none  Assessment/Plan:  Symptomatic anemia S/P vag hys  Will transfuse three units of PRBCs. R and B reviewed. The patient agrees. Check CBC in the morning. Check orthostatic BP and Pulses in the morning. Plan discharge in the morning.  LOS: 0 days    Janine Limbo 07/12/2012, 6:29 PM

## 2012-07-12 NOTE — Anesthesia Postprocedure Evaluation (Signed)
  Anesthesia Post Note  Patient: Catherine Mcdonald  Procedure(s) Performed: Procedure(s) (LRB): HYSTERECTOMY VAGINAL (N/A)  Anesthesia type: GA  Patient location: PACU  Post pain: Pain level controlled  Post assessment: Post-op Vital signs reviewed  Last Vitals:  Filed Vitals:   07/12/12 1315  BP: 130/82  Pulse: 72  Temp:   Resp: 17    Post vital signs: Reviewed  Level of consciousness: sedated  Complications: No apparent anesthesia complications

## 2012-07-12 NOTE — Transfer of Care (Signed)
Immediate Anesthesia Transfer of Care Note  Patient: Catherine Mcdonald  Procedure(s) Performed: Procedure(s): HYSTERECTOMY VAGINAL (N/A)  Patient Location: PACU  Anesthesia Type:General  Level of Consciousness: awake, alert  and oriented  Airway & Oxygen Therapy: Patient Spontanous Breathing and Patient connected to nasal cannula oxygen  Post-op Assessment: Report given to PACU RN and Post -op Vital signs reviewed and stable  Post vital signs: Reviewed and stable  Complications: No apparent anesthesia complications

## 2012-07-12 NOTE — Op Note (Signed)
OPERATIVE NOTE  Catherine Mcdonald  DOB:    [birth year]  MRN:    [id number]  CSN:    [id number]  Date of Surgery:  07/12/2012  Preoperative Diagnosis:  Fibroid uterus  Menorrhagia  Anemia (hemoglobin 6.3)  Postoperative Diagnosis:  Same  Procedure:  Vaginal hysterectomy  Surgeon:  Leonard Schwartz, M.D.  Assistant:  Henreitta Leber [facility]  Anesthetic:  General  Disposition:  The patient presents with the above-mentioned diagnosis. She understands the indications for her surgical procedure.  She also understands the alternative treatment options. She accepts the risk of, but not limited to, anesthetic complications, bleeding, infections, and possible damage to the surrounding organs.  Findings:  The patient had a multi-fibroid 12 weeks size uterus.  Procedure:  The patient was taken to the operating room where a general anesthetic was given. The patient's lower abdomen, perineum, and vagina were prepped with multiple layers of Betadine. A Foley catheter was placed in the bladder. The patient was sterilely draped. An examination under anesthesia was performed. Operative findings are mentioned above. The cervix was injected with half percent Marcaine with epinephrine. A circumferential incision was made around the cervix. The vaginal mucosa was advanced anteriorly and posteriorly. The anterior cul-de-sac and in the posterior cul-de-sac were sharply entered. Alternating from right to left the uterosacral ligaments, paracervical tissues, parametrial tissues, and uterine arteries were clamped, cut, sutured, and tied securely. The upper pedicles were then clamped and cut. The uterus was removed from the operative field. The upper pedicles were secured using free ties and then suture ligatures. The patient had bleeding from the posterior cuff and from the left upper pedicles. Figure-of-eight sutures of 0 Vicryl were placed. Hemostasis was confirmed. The sutures attached to the  uterosacral ligaments were brought out through the vaginal angles and then tied securely. A McCall culdoplasty suture was placed in the posterior cul-de-sac incorporating the uterosacral ligaments bilaterally and the posterior peritoneum. A final check was made for hemostasis and again hemostasis was confirmed. The vaginal cuff was closed using figure-of-eight sutures incorporating the anterior vaginal mucosa, the anterior peritoneum, posterior peritoneum, and the posterior vaginal mucosa. The McCall culdoplasty suture was tied securely and the apex of the vagina was noted to elevate into the midpelvis. The patient tolerated her procedure well. She was awakened from her anesthetic without difficulty and then transported to the recovery room in stable condition. Sponge, needle, and instrument counts were correct on 2 occasions. The estimated blood loss was 300 cc's. 0 Vicryl is the suture material used throughout the procedure. The uterus was sent to pathology.   Leonard Schwartz, M.D.

## 2012-07-13 ENCOUNTER — Encounter (HOSPITAL_COMMUNITY): Payer: Self-pay | Admitting: Obstetrics and Gynecology

## 2012-07-13 LAB — CBC
HCT: 28.2 % — ABNORMAL LOW (ref 36.0–46.0)
MCHC: 29.1 g/dL — ABNORMAL LOW (ref 30.0–36.0)
MCV: 69.1 fL — ABNORMAL LOW (ref 78.0–100.0)
RDW: 27.2 % — ABNORMAL HIGH (ref 11.5–15.5)

## 2012-07-13 MED ORDER — IBUPROFEN 800 MG PO TABS: ORAL_TABLET | ORAL | Status: DC

## 2012-07-13 MED ORDER — PROMETHAZINE HCL 12.5 MG PO TABS: 12.5000 mg | ORAL_TABLET | Freq: Four times a day (QID) | ORAL | Status: DC | PRN

## 2012-07-13 MED ORDER — OXYCODONE-ACETAMINOPHEN 5-325 MG PO TABS: 1.0000 | ORAL_TABLET | Freq: Four times a day (QID) | ORAL | Status: DC | PRN

## 2012-07-13 NOTE — Progress Notes (Signed)
Catherine Mcdonald is a39 y.o.  [id number]  Post Op Date # 1; Total Vaginal Hysterectomy  Subjective: Patient is Doing well postoperatively. Patient has Pain managed with Toradol and PCA (none since 6:30 a.m., Lightheaded and no further dizziness.  Only experiences with ambulation and marked position changes in the bed. (S/P Blood Transfusion x 3 units).  Has only had liquids, but is preparing to order a regular diet.  Hasn't voided since Foley was removed a few hours ago.   Objective: Vital signs in last 24 hours: Temp:  [97.2 F (36.2 C)-99.1 F (37.3 C)] 97.9 F (36.6 C) (05/02 0500) Pulse Rate:  [61-112] 76 (05/02 0550) Resp:  [12-21] 16 (05/02 0552) BP: (112-142)/(60-99) 131/85 mmHg (05/02 0550) SpO2:  [98 %-100 %] 100 % (05/02 0552) Weight:  [180 lb (81.647 kg)] 180 lb (81.647 kg) (05/01 1500)  Intake/Output from previous day: 05/01 0701 - 05/02 0700 In: 4262.3 [P.O.:360; I.V.:2600] Out: 1900 [Urine:1600] Intake/Output this shift:    Recent Labs Lab 07/11/12 2050 07/12/12 1245 07/13/12 0530  WBC 7.4 7.6 9.5  HGB 6.3* 5.6* 8.2*  HCT 24.0* 20.8* 28.2*  PLT 423* 338 307    No results found for this basename: NA, K, CL, CO2, BUN, CREATININE, CALCIUM, LABALBU, PROT, BILITOT, ALKPHOS, ALT, AST, GLUCOSE,  in the last 168 hours  EXAM: General: alert, cooperative and no distress Resp: clear to auscultation bilaterally Cardio: regular rate and rhythm, S1, S2 normal, no murmur, click, rub or gallop GI: bowel sounds present, soft and appropriately tender. Extremities: Homans sign is negative, no sign of DVT Vaginal Bleeding: minimal   Assessment: s/p Procedure(s): HYSTERECTOMY VAGINAL: stable, progressing well and anemia  Plan: Advance diet Encourage ambulation Advance to PO medication Discontinue IV fluids Discharge home  LOS: 1 day    Malichi Palardy, PA-C 07/13/2012 7:28 AM

## 2012-07-13 NOTE — Discharge Summary (Signed)
Physician Discharge Summary  Patient ID: DENICA WEB MRN: [id number] DOB/AGE: 44-Sep-1972 44 y.o.  Admit date: 07/12/2012 Discharge date: 07/13/2012   Discharge Diagnoses: Symptomatic Uterine Fibroids,  Menorrhagia and Severe Anemia Active Problems:   * No active hospital problems. *   Operation: Total Vaginal Hysterectomy   Discharged Condition: Stable  Hospital Course: On the date of admission the patient underwent the aforementioned procedure and tolerated it well.  Post operative course was marked by orthostasis resulting from pre-operative anemia and surgical blood loss. After being transfused  3 units of packed red blood cells,  the patient tolerated  a hemoglobin of 8.2  (pre-operative hemoglobin = 6.3 and immediate post operative hemoglobin = 5.6).  By post operative day #1 the patient had resumed bowel and bladder function and was therefore deemed ready for discharge home.  Disposition: Home to self care  Discharge Medications:    Medication List    STOP taking these medications       medroxyPROGESTERone 10 MG tablet  Commonly known as:  PROVERA      TAKE these medications       ibuprofen 800 MG tablet  Commonly known as:  ADVIL,MOTRIN  1 po pc every 8 hours for 5 days then prn-pain     oxyCODONE-acetaminophen 5-325 MG per tablet  Commonly known as:  ROXICET  Take 1-2 tablets by mouth every 6 (six) hours as needed for pain.     promethazine 12.5 MG tablet  Commonly known as:  PHENERGAN  Take 1 tablet (12.5 mg total) by mouth every 6 (six) hours as needed.        Follow-up: Dr. Janine Limbo,  August 16, 2012 at 4 p.m.   SignedHenreitta Leber, PA-C 07/13/2012, 7:46 AM

## 2012-07-13 NOTE — Anesthesia Postprocedure Evaluation (Signed)
  Anesthesia Post-op Note  Patient: Catherine Mcdonald  Procedure(s) Performed: Procedure(s): HYSTERECTOMY VAGINAL (N/A)  Patient Location: [facility]  Anesthesia Type:General  Level of Consciousness: awake, alert  and oriented  Airway and Oxygen Therapy: Patient Spontanous Breathing and Patient connected to nasal cannula oxygen  Post-op Pain: none  Post-op Assessment: Post-op Vital signs reviewed and Patient's Cardiovascular Status Stable  Post-op Vital Signs: Reviewed and stable  Complications: No apparent anesthesia complications

## 2012-07-13 NOTE — Progress Notes (Signed)
PROGRESS NOTE  I have reviewed the patient's vital signs, labs, and notes. I have examined the patient. I agree with the previous note from the physician assistant.  Leonard Schwartz, M.D. May 2,2014

## 2012-07-14 LAB — TYPE AND SCREEN
Antibody Screen: NEGATIVE
Unit division: 0
Unit division: 0
Unit division: 0

## 2014-01-13 ENCOUNTER — Encounter (HOSPITAL_COMMUNITY): Payer: Self-pay | Admitting: Obstetrics and Gynecology

## 2014-04-15 DIAGNOSIS — Z0000 Encounter for general adult medical examination without abnormal findings: Secondary | ICD-10-CM | POA: Insufficient documentation

## 2014-07-07 DIAGNOSIS — M25571 Pain in right ankle and joints of right foot: Secondary | ICD-10-CM | POA: Insufficient documentation

## 2015-05-18 DIAGNOSIS — M791 Myalgia, unspecified site: Secondary | ICD-10-CM | POA: Insufficient documentation

## 2015-12-28 DIAGNOSIS — E785 Hyperlipidemia, unspecified: Secondary | ICD-10-CM | POA: Insufficient documentation

## 2016-08-23 DIAGNOSIS — M546 Pain in thoracic spine: Secondary | ICD-10-CM | POA: Insufficient documentation

## 2016-08-23 DIAGNOSIS — J029 Acute pharyngitis, unspecified: Secondary | ICD-10-CM | POA: Insufficient documentation

## 2016-10-20 DIAGNOSIS — N951 Menopausal and female climacteric states: Secondary | ICD-10-CM | POA: Insufficient documentation

## 2016-10-20 DIAGNOSIS — R5383 Other fatigue: Secondary | ICD-10-CM | POA: Insufficient documentation

## 2017-06-02 DIAGNOSIS — R079 Chest pain, unspecified: Secondary | ICD-10-CM | POA: Insufficient documentation

## 2017-06-02 DIAGNOSIS — R002 Palpitations: Secondary | ICD-10-CM | POA: Insufficient documentation

## 2017-06-02 DIAGNOSIS — R0600 Dyspnea, unspecified: Secondary | ICD-10-CM | POA: Insufficient documentation

## 2018-12-31 ENCOUNTER — Encounter: Payer: Self-pay | Admitting: Gastroenterology

## 2019-01-25 ENCOUNTER — Ambulatory Visit (AMBULATORY_SURGERY_CENTER): Payer: Self-pay

## 2019-01-25 ENCOUNTER — Encounter: Payer: Self-pay | Admitting: Gastroenterology

## 2019-01-25 ENCOUNTER — Other Ambulatory Visit: Payer: Self-pay

## 2019-01-25 VITALS — Temp 96.6°F | Ht 70.0 in | Wt 205.4 lb

## 2019-01-25 DIAGNOSIS — Z1211 Encounter for screening for malignant neoplasm of colon: Secondary | ICD-10-CM

## 2019-01-25 MED ORDER — NA SULFATE-K SULFATE-MG SULF 17.5-3.13-1.6 GM/177ML PO SOLN
1.0000 | Freq: Once | ORAL | 0 refills | Status: AC
Start: 2019-01-25 — End: 2019-01-25

## 2019-01-25 NOTE — Progress Notes (Signed)
Denies allergies to eggs or soy products. Denies complication of anesthesia or sedation. Denies use of weight loss medication. Denies use of O2.   Emmi instructions given for colonoscopy.  Covid screening is scheduled for Thursday 01/31/19 @ 9:10 Am. A 15.00 coupon for Suprep was given to the patient.

## 2019-01-31 ENCOUNTER — Ambulatory Visit (INDEPENDENT_AMBULATORY_CARE_PROVIDER_SITE_OTHER): Payer: BC Managed Care – PPO

## 2019-01-31 DIAGNOSIS — Z1159 Encounter for screening for other viral diseases: Secondary | ICD-10-CM

## 2019-02-01 LAB — SARS CORONAVIRUS 2 (TAT 6-24 HRS): SARS Coronavirus 2: NEGATIVE

## 2019-02-04 ENCOUNTER — Telehealth: Payer: Self-pay | Admitting: Gastroenterology

## 2019-02-04 NOTE — Telephone Encounter (Signed)
Pt called to inform that on Friday she was inform that she had been in contact with a person who tested positive for Covid. Pt stated that she is feeling fine and was an indirect contact but wants to know if this affects her procedure schedule for tomorrow with Dr.Beavers.

## 2019-02-04 NOTE — Telephone Encounter (Signed)
Please review our current protocol with Lanelle Bal. Thank you.

## 2019-02-04 NOTE — Telephone Encounter (Signed)
Thank you :)

## 2019-02-04 NOTE — Telephone Encounter (Signed)
Returned patients phone call. Spoke with Alphonsa Gin Rn  and Joella Prince RN, who called [facility] and was advised best to reschedule screening colonoscopy out 11 days due to Covid exposure. Exposure was on  02/01/19. Patient was rescheduled for 02/21/19 @ 11:30 Am. Covid screening was re-scheduled for 02/18/19 @ 1:20 Pm. New instructions were printed and mailed to the patient. Patient was encouraged to call if she had any questions regarding instructions. Patient was also instructed to call us if she became Covid Positive and we would need to reschedule her again.   Riki Sheer, LPN ( [facility] )

## 2019-02-04 NOTE — Telephone Encounter (Signed)
Phone call to pt to see what kind of "indirect" contact pt had with this COVID-19 positive person.  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  She said that she was on the side line and the player was on the volleyball court - was never 14-20 feet close to the players.  Pt had her COVID-19 test this past Thursday.  The contact with the positive player was this past Friday AM.  Pt has not had any sx of COVID-19.   I thanked pt for calling and reporting this info.  Pt wants to make sure she can have her colonoscopy tomorrow.  I advised pt to stay on clear liquids until she hears back form [facility].  Please advise.

## 2019-02-05 ENCOUNTER — Encounter: Payer: BC Managed Care – PPO | Admitting: Gastroenterology

## 2019-02-05 ENCOUNTER — Telehealth: Payer: Self-pay | Admitting: Gastroenterology

## 2019-02-05 NOTE — Telephone Encounter (Signed)
Spoke to the patient and told the patient we could not email test results which is not secure. Sent the patient a MyChart activation code so she could set up [place]. Verbalized understanding.

## 2019-02-18 ENCOUNTER — Ambulatory Visit (INDEPENDENT_AMBULATORY_CARE_PROVIDER_SITE_OTHER): Payer: BC Managed Care – PPO

## 2019-02-18 ENCOUNTER — Other Ambulatory Visit: Payer: Self-pay | Admitting: Gastroenterology

## 2019-02-18 DIAGNOSIS — Z1159 Encounter for screening for other viral diseases: Secondary | ICD-10-CM

## 2019-02-19 LAB — SARS CORONAVIRUS 2 (TAT 6-24 HRS): SARS Coronavirus 2: NEGATIVE

## 2019-02-21 ENCOUNTER — Other Ambulatory Visit: Payer: Self-pay

## 2019-02-21 ENCOUNTER — Ambulatory Visit (AMBULATORY_SURGERY_CENTER): Payer: BC Managed Care – PPO | Admitting: Gastroenterology

## 2019-02-21 ENCOUNTER — Encounter: Payer: Self-pay | Admitting: Gastroenterology

## 2019-02-21 VITALS — BP 118/77 | HR 62 | Temp 98.5°F | Resp 15 | Ht 70.0 in | Wt 205.0 lb

## 2019-02-21 DIAGNOSIS — Z1211 Encounter for screening for malignant neoplasm of colon: Secondary | ICD-10-CM | POA: Diagnosis present

## 2019-02-21 MED ORDER — SODIUM CHLORIDE 0.9 % IV SOLN: 500.0000 mL | Freq: Once | INTRAVENOUS | Status: DC

## 2019-02-21 NOTE — Patient Instructions (Signed)
Continue previous medications Resume previous diet    YOU HAD AN ENDOSCOPIC PROCEDURE TODAY AT [place]:   Refer to the procedure report that was given to you for any specific questions about what was found during the examination.  If the procedure report does not answer your questions, please call your gastroenterologist to clarify.  If you requested that your care partner not be given the details of your procedure findings, then the procedure report has been included in a sealed envelope for you to review at your convenience later.  YOU SHOULD EXPECT: Some feelings of bloating in the abdomen. Passage of more gas than usual.  Walking can help get rid of the air that was put into your GI tract during the procedure and reduce the bloating. If you had a lower endoscopy (such as a colonoscopy or flexible sigmoidoscopy) you may notice spotting of blood in your stool or on the toilet paper. If you underwent a bowel prep for your procedure, you may not have a normal bowel movement for a few days.  Please Note:  You might notice some irritation and congestion in your nose or some drainage.  This is from the oxygen used during your procedure.  There is no need for concern and it should clear up in a day or so.  SYMPTOMS TO REPORT IMMEDIATELY:   Following lower endoscopy (colonoscopy or flexible sigmoidoscopy):  Excessive amounts of blood in the stool  Significant tenderness or worsening of abdominal pains  Swelling of the abdomen that is new, acute  Fever of 100F or higher   For urgent or emergent issues, a gastroenterologist can be reached at any hour by calling [phone].   DIET:  We do recommend a small meal at first, but then you may proceed to your regular diet.  Drink plenty of fluids but you should avoid alcoholic beverages for 24 hours.  ACTIVITY:  You should plan to take it easy for the rest of today and you should NOT DRIVE or use heavy machinery until tomorrow  (because of the sedation medicines used during the test).    FOLLOW UP: Our staff will call the number listed on your records 48-72 hours following your procedure to check on you and address any questions or concerns that you may have regarding the information given to you following your procedure. If we do not reach you, we will leave a message.  We will attempt to reach you two times.  During this call, we will ask if you have developed any symptoms of COVID 19. If you develop any symptoms (ie: fever, flu-like symptoms, shortness of breath, cough etc.) before then, please call [phone].  If you test positive for Covid 19 in the 2 weeks post procedure, please call and report this information to [place].    If any biopsies were taken you will be contacted by phone or by letter within the next 1-3 weeks.  Please call us at [phone] if you have not heard about the biopsies in 3 weeks.    SIGNATURES/CONFIDENTIALITY: You and/or your care partner have signed paperwork which will be entered into your electronic medical record.  These signatures attest to the fact that that the information above on your After Visit Summary has been reviewed and is understood.  Full responsibility of the confidentiality of this discharge information lies with you and/or your care-partner.

## 2019-02-21 NOTE — Op Note (Signed)
[facility] Patient Name: Catherine Mcdonald Procedure Date: 02/21/2019 11:15 AM MRN: [id number] Endoscopist: Thornton Park MD, MD Age: 48 Referring MD:  Date of Birth: February 19, 1971 Gender: Female Account #: [id number] Procedure:                Colonoscopy Indications:              Screening for colorectal malignant neoplasm, This                            is the patient's first colonoscopy                           No known family history of colon cancer or polyps Medicines:                Monitored Anesthesia Care Procedure:                Pre-Anesthesia Assessment:                           - Prior to the procedure, a History and Physical                            was performed, and patient medications and                            allergies were reviewed. The patient's tolerance of                            previous anesthesia was also reviewed. The risks                            and benefits of the procedure and the sedation                            options and risks were discussed with the patient.                            All questions were answered, and informed consent                            was obtained. Prior Anticoagulants: The patient has                            taken no previous anticoagulant or antiplatelet                            agents. ASA Grade Assessment: II - A patient with                            mild systemic disease. After reviewing the risks                            and benefits, the patient was deemed in  satisfactory condition to undergo the procedure.                           After obtaining informed consent, the colonoscope                            was passed under direct vision. Throughout the                            procedure, the patient's blood pressure, pulse, and                            oxygen saturations were monitored continuously. The                            Colonoscope was  introduced through the anus and                            advanced to the the terminal ileum, with                            identification of the appendiceal orifice and IC                            valve. A second forward view of the right colon was                            performed. The patient tolerated the procedure                            well. The quality of the bowel preparation was                            good. The terminal ileum, ileocecal valve,                            appendiceal orifice, and rectum were photographed. Scope In: [id number] Scope Out: 11:34:41 AM Scope Withdrawal Time: 0 hours 13 minutes 40 seconds  Total Procedure Duration: 0 hours 15 minutes 23 seconds  Findings:                 The perianal and digital rectal examinations were                            normal.                           The entire examined colon appeared normal on direct                            and retroflexion views.                           The terminal ileum appeared normal. Complications:  No immediate complications. Estimated Blood Loss:     Estimated blood loss: none. Impression:               - The entire examined colon is normal on direct and                            retroflexion views.                           - The examined portion of the ileum was normal.                           - No specimens collected. Recommendation:           - Patient has a contact number available for                            emergencies. The signs and symptoms of potential                            delayed complications were discussed with the                            patient. Return to normal activities tomorrow.                            Written discharge instructions were provided to the                            patient.                           - Resume previous diet today.                           - Continue present medications.                           -  Repeat colonoscopy in 10 years for screening                            purposes, earlier with any new symptoms. Thornton Park MD, MD 02/21/2019 11:41:56 AM This report has been signed electronically.

## 2019-02-21 NOTE — Progress Notes (Signed)
To PACU, VSS. Report to [email or web address] 

## 2019-02-25 ENCOUNTER — Telehealth: Payer: Self-pay

## 2019-02-25 NOTE — Telephone Encounter (Signed)
Covid-19 screening questions   Do you now or have you had a fever in the last 14 days? No.  Do you have any respiratory symptoms of shortness of breath or cough now or in the last 14 days? No.  Do you have any family members or close contacts with diagnosed or suspected Covid-19 in the past 14 days? No.  Have you been tested for Covid-19 and found to be positive? No.       Follow up Call-  Call back number 02/21/2019  Post procedure Call Back phone  # [phone]  Permission to leave phone message Yes  Some recent data might be hidden     Patient questions:  Do you have a fever, pain , or abdominal swelling? No. Pain Score  0 *  Have you tolerated food without any problems? Yes.    Have you been able to return to your normal activities? Yes.    Do you have any questions about your discharge instructions: Diet   No. Medications  No. Follow up visit  No.  Do you have questions or concerns about your Care? No.  Actions: * If pain score is 4 or above: No action needed, pain <4.

## 2019-06-13 ENCOUNTER — Ambulatory Visit: Payer: BC Managed Care – PPO | Attending: Internal Medicine

## 2019-06-13 DIAGNOSIS — Z23 Encounter for immunization: Secondary | ICD-10-CM

## 2019-06-13 NOTE — Progress Notes (Signed)
   Covid-19 Vaccination Clinic  Name:  Catherine Mcdonald    MRN: [id number] DOB: July 04, 1970  06/13/2019  [name] was observed post Covid-19 immunization for 15 minutes without incident. She was provided with Vaccine Information Sheet and instruction to access the V-Safe system.   [name] was instructed to call 911 with any severe reactions post vaccine: Marland Kitchen Difficulty breathing  . Swelling of face and throat  . A fast heartbeat  . A bad rash all over body  . Dizziness and weakness   Immunizations Administered    Name Date Dose VIS Date Route   Pfizer COVID-19 Vaccine 06/13/2019  8:54 AM 0.3 mL 02/22/2019 Intramuscular   Manufacturer: [place]   Lot: [id number]   [place]: [id number]

## 2019-06-25 DIAGNOSIS — M542 Cervicalgia: Secondary | ICD-10-CM | POA: Insufficient documentation

## 2019-06-25 DIAGNOSIS — M62830 Muscle spasm of back: Secondary | ICD-10-CM | POA: Insufficient documentation

## 2019-07-09 ENCOUNTER — Ambulatory Visit: Payer: BC Managed Care – PPO | Attending: Internal Medicine

## 2019-07-09 DIAGNOSIS — Z23 Encounter for immunization: Secondary | ICD-10-CM

## 2019-07-09 NOTE — Progress Notes (Signed)
   Covid-19 Vaccination Clinic  Name:  Catherine Mcdonald    MRN: [id number] DOB: 1970-12-06  07/09/2019  Catherine Mcdonald was observed post Covid-19 immunization for 15 minutes without incident. She was provided with Vaccine Information Sheet and instruction to access the V-Safe system.   Catherine Mcdonald was instructed to call 911 with any severe reactions post vaccine: Marland Kitchen Difficulty breathing  . Swelling of face and throat  . A fast heartbeat  . A bad rash all over body  . Dizziness and weakness   Immunizations Administered    Name Date Dose VIS Date Route   Pfizer COVID-19 Vaccine 07/09/2019  8:46 AM 0.3 mL 05/08/2018 Intramuscular   Manufacturer: [place]   Lot: [id number]   [place]: [id number]

## 2019-12-20 DIAGNOSIS — M79671 Pain in right foot: Secondary | ICD-10-CM | POA: Insufficient documentation

## 2020-04-24 ENCOUNTER — Other Ambulatory Visit: Payer: Self-pay | Admitting: Obstetrics & Gynecology

## 2021-01-08 ENCOUNTER — Encounter (HOSPITAL_COMMUNITY)
Admission: EM | Disposition: A | Discharge status: Home / Self Care | Payer: Self-pay | Source: Home / Self Care | Attending: Thoracic Surgery (Cardiothoracic Vascular Surgery)

## 2021-01-08 ENCOUNTER — Encounter (HOSPITAL_COMMUNITY): Payer: Self-pay | Admitting: Certified Registered"

## 2021-01-08 ENCOUNTER — Inpatient Hospital Stay (HOSPITAL_COMMUNITY): Payer: BC Managed Care – PPO

## 2021-01-08 ENCOUNTER — Emergency Department (HOSPITAL_COMMUNITY): Payer: BC Managed Care – PPO

## 2021-01-08 ENCOUNTER — Inpatient Hospital Stay (HOSPITAL_COMMUNITY): Payer: BC Managed Care – PPO | Admitting: Certified Registered"

## 2021-01-08 ENCOUNTER — Inpatient Hospital Stay (HOSPITAL_COMMUNITY)
Admission: EM | Admit: 2021-01-08 | Discharge: 2021-01-17 | DRG: 220 | Disposition: A | Discharge status: Home / Self Care | Payer: BC Managed Care – PPO | Attending: Thoracic Surgery (Cardiothoracic Vascular Surgery) | Admitting: Thoracic Surgery (Cardiothoracic Vascular Surgery)

## 2021-01-08 DIAGNOSIS — I9581 Postprocedural hypotension: Secondary | ICD-10-CM | POA: Diagnosis not present

## 2021-01-08 DIAGNOSIS — D62 Acute posthemorrhagic anemia: Secondary | ICD-10-CM | POA: Diagnosis not present

## 2021-01-08 DIAGNOSIS — I200 Unstable angina: Secondary | ICD-10-CM

## 2021-01-08 DIAGNOSIS — Z20822 Contact with and (suspected) exposure to covid-19: Secondary | ICD-10-CM | POA: Diagnosis present

## 2021-01-08 DIAGNOSIS — R609 Edema, unspecified: Secondary | ICD-10-CM | POA: Diagnosis not present

## 2021-01-08 DIAGNOSIS — D72829 Elevated white blood cell count, unspecified: Secondary | ICD-10-CM | POA: Diagnosis not present

## 2021-01-08 DIAGNOSIS — I71019 Dissection of thoracic aorta, unspecified: Secondary | ICD-10-CM | POA: Diagnosis not present

## 2021-01-08 DIAGNOSIS — Z8679 Personal history of other diseases of the circulatory system: Secondary | ICD-10-CM

## 2021-01-08 DIAGNOSIS — E8770 Fluid overload, unspecified: Secondary | ICD-10-CM | POA: Diagnosis not present

## 2021-01-08 DIAGNOSIS — M7989 Other specified soft tissue disorders: Secondary | ICD-10-CM | POA: Diagnosis not present

## 2021-01-08 DIAGNOSIS — I10 Essential (primary) hypertension: Secondary | ICD-10-CM | POA: Diagnosis present

## 2021-01-08 DIAGNOSIS — J9601 Acute respiratory failure with hypoxia: Secondary | ICD-10-CM | POA: Diagnosis not present

## 2021-01-08 DIAGNOSIS — R7303 Prediabetes: Secondary | ICD-10-CM | POA: Diagnosis present

## 2021-01-08 DIAGNOSIS — I7101 Dissection of ascending aorta: Secondary | ICD-10-CM | POA: Diagnosis present

## 2021-01-08 DIAGNOSIS — Z833 Family history of diabetes mellitus: Secondary | ICD-10-CM

## 2021-01-08 DIAGNOSIS — Z9889 Other specified postprocedural states: Secondary | ICD-10-CM | POA: Diagnosis not present

## 2021-01-08 DIAGNOSIS — Z8249 Family history of ischemic heart disease and other diseases of the circulatory system: Secondary | ICD-10-CM

## 2021-01-08 DIAGNOSIS — E785 Hyperlipidemia, unspecified: Secondary | ICD-10-CM | POA: Diagnosis present

## 2021-01-08 DIAGNOSIS — N179 Acute kidney failure, unspecified: Secondary | ICD-10-CM | POA: Diagnosis present

## 2021-01-08 DIAGNOSIS — D573 Sickle-cell trait: Secondary | ICD-10-CM | POA: Diagnosis present

## 2021-01-08 DIAGNOSIS — J939 Pneumothorax, unspecified: Secondary | ICD-10-CM

## 2021-01-08 DIAGNOSIS — I7121 Aneurysm of the ascending aorta, without rupture: Secondary | ICD-10-CM

## 2021-01-08 DIAGNOSIS — J9811 Atelectasis: Secondary | ICD-10-CM | POA: Diagnosis present

## 2021-01-08 DIAGNOSIS — I712 Thoracic aortic aneurysm, without rupture, unspecified: Secondary | ICD-10-CM | POA: Diagnosis present

## 2021-01-08 DIAGNOSIS — D696 Thrombocytopenia, unspecified: Secondary | ICD-10-CM | POA: Diagnosis not present

## 2021-01-08 DIAGNOSIS — I214 Non-ST elevation (NSTEMI) myocardial infarction: Secondary | ICD-10-CM | POA: Diagnosis not present

## 2021-01-08 DIAGNOSIS — R579 Shock, unspecified: Secondary | ICD-10-CM | POA: Diagnosis not present

## 2021-01-08 DIAGNOSIS — I2510 Atherosclerotic heart disease of native coronary artery without angina pectoris: Secondary | ICD-10-CM | POA: Diagnosis not present

## 2021-01-08 HISTORY — PX: THORACIC AORTIC ANEURYSM REPAIR: SHX799

## 2021-01-08 HISTORY — PX: TEE WITHOUT CARDIOVERSION: SHX5443

## 2021-01-08 LAB — POCT I-STAT 7, (LYTES, BLD GAS, ICA,H+H)
Acid-Base Excess: 1 mmol/L (ref 0.0–2.0)
Acid-Base Excess: 1 mmol/L (ref 0.0–2.0)
Acid-base deficit: 2 mmol/L (ref 0.0–2.0)
Acid-base deficit: 1 mmol/L (ref 0.0–2.0)
Acid-base deficit: 4 mmol/L — ABNORMAL HIGH (ref 0.0–2.0)
Bicarbonate: 27 mmol/L (ref 20.0–28.0)
Bicarbonate: 26.9 mmol/L (ref 20.0–28.0)
Bicarbonate: 20.9 mmol/L (ref 20.0–28.0)
Bicarbonate: 22.5 mmol/L (ref 20.0–28.0)
Bicarbonate: 24.7 mmol/L (ref 20.0–28.0)
Calcium, Ion: 1.18 mmol/L (ref 1.15–1.40)
Calcium, Ion: 1.02 mmol/L — ABNORMAL LOW (ref 1.15–1.40)
Calcium, Ion: 0.93 mmol/L — ABNORMAL LOW (ref 1.15–1.40)
Calcium, Ion: 0.9 mmol/L — ABNORMAL LOW (ref 1.15–1.40)
Calcium, Ion: 1.06 mmol/L — ABNORMAL LOW (ref 1.15–1.40)
HCT: 36 % (ref 36.0–46.0)
HCT: 29 % — ABNORMAL LOW (ref 36.0–46.0)
HCT: 26 % — ABNORMAL LOW (ref 36.0–46.0)
HCT: 20 % — ABNORMAL LOW (ref 36.0–46.0)
HCT: 26 % — ABNORMAL LOW (ref 36.0–46.0)
Hemoglobin: 12.2 g/dL (ref 12.0–15.0)
Hemoglobin: 9.9 g/dL — ABNORMAL LOW (ref 12.0–15.0)
Hemoglobin: 8.8 g/dL — ABNORMAL LOW (ref 12.0–15.0)
Hemoglobin: 6.8 g/dL — CL (ref 12.0–15.0)
Hemoglobin: 8.8 g/dL — ABNORMAL LOW (ref 12.0–15.0)
O2 Saturation: 100 %
O2 Saturation: 100 %
O2 Saturation: 100 %
O2 Saturation: 100 %
O2 Saturation: 100 %
Potassium: 3.9 mmol/L (ref 3.5–5.1)
Potassium: 4 mmol/L (ref 3.5–5.1)
Potassium: 4.2 mmol/L (ref 3.5–5.1)
Potassium: 4.3 mmol/L (ref 3.5–5.1)
Potassium: 4.9 mmol/L (ref 3.5–5.1)
Sodium: 140 mmol/L (ref 135–145)
Sodium: 140 mmol/L (ref 135–145)
Sodium: 138 mmol/L (ref 135–145)
Sodium: 132 mmol/L — ABNORMAL LOW (ref 135–145)
Sodium: 137 mmol/L (ref 135–145)
TCO2: 28 mmol/L (ref 22–32)
TCO2: 28 mmol/L (ref 22–32)
TCO2: 22 mmol/L (ref 22–32)
TCO2: 24 mmol/L (ref 22–32)
TCO2: 26 mmol/L (ref 22–32)
pCO2 arterial: 45.9 mmHg (ref 32.0–48.0)
pCO2 arterial: 46.9 mmHg (ref 32.0–48.0)
pCO2 arterial: 28.7 mmHg — ABNORMAL LOW (ref 32.0–48.0)
pCO2 arterial: 44.7 mmHg (ref 32.0–48.0)
pCO2 arterial: 46.7 mmHg (ref 32.0–48.0)
pH, Arterial: 7.377 (ref 7.350–7.450)
pH, Arterial: 7.366 (ref 7.350–7.450)
pH, Arterial: 7.47 — ABNORMAL HIGH (ref 7.350–7.450)
pH, Arterial: 7.291 — ABNORMAL LOW (ref 7.350–7.450)
pH, Arterial: 7.35 (ref 7.350–7.450)
pO2, Arterial: 460 mmHg — ABNORMAL HIGH (ref 83.0–108.0)
pO2, Arterial: 415 mmHg — ABNORMAL HIGH (ref 83.0–108.0)
pO2, Arterial: 307 mmHg — ABNORMAL HIGH (ref 83.0–108.0)
pO2, Arterial: 339 mmHg — ABNORMAL HIGH (ref 83.0–108.0)
pO2, Arterial: 389 mmHg — ABNORMAL HIGH (ref 83.0–108.0)

## 2021-01-08 LAB — POCT I-STAT EG7
Acid-Base Excess: 0 mmol/L (ref 0.0–2.0)
Acid-base deficit: 7 mmol/L — ABNORMAL HIGH (ref 0.0–2.0)
Bicarbonate: 25.5 mmol/L (ref 20.0–28.0)
Bicarbonate: 22.1 mmol/L (ref 20.0–28.0)
Calcium, Ion: 1.07 mmol/L — ABNORMAL LOW (ref 1.15–1.40)
Calcium, Ion: 1.07 mmol/L — ABNORMAL LOW (ref 1.15–1.40)
HCT: 31 % — ABNORMAL LOW (ref 36.0–46.0)
HCT: 27 % — ABNORMAL LOW (ref 36.0–46.0)
Hemoglobin: 10.5 g/dL — ABNORMAL LOW (ref 12.0–15.0)
Hemoglobin: 9.2 g/dL — ABNORMAL LOW (ref 12.0–15.0)
O2 Saturation: 87 %
O2 Saturation: 68 %
Potassium: 3.7 mmol/L (ref 3.5–5.1)
Potassium: 3.9 mmol/L (ref 3.5–5.1)
Sodium: 141 mmol/L (ref 135–145)
Sodium: 139 mmol/L (ref 135–145)
TCO2: 27 mmol/L (ref 22–32)
TCO2: 24 mmol/L (ref 22–32)
pCO2, Ven: 44.5 mmHg (ref 44.0–60.0)
pCO2, Ven: 62.9 mmHg — ABNORMAL HIGH (ref 44.0–60.0)
pH, Ven: 7.366 (ref 7.250–7.430)
pH, Ven: 7.153 — CL (ref 7.250–7.430)
pO2, Ven: 56 mmHg — ABNORMAL HIGH (ref 32.0–45.0)
pO2, Ven: 46 mmHg — ABNORMAL HIGH (ref 32.0–45.0)

## 2021-01-08 LAB — POCT I-STAT, CHEM 8
BUN: 10 mg/dL (ref 6–20)
BUN: 11 mg/dL (ref 6–20)
BUN: 11 mg/dL (ref 6–20)
BUN: 13 mg/dL (ref 6–20)
BUN: 13 mg/dL (ref 6–20)
Calcium, Ion: 1.19 mmol/L (ref 1.15–1.40)
Calcium, Ion: 1.14 mmol/L — ABNORMAL LOW (ref 1.15–1.40)
Calcium, Ion: 0.94 mmol/L — ABNORMAL LOW (ref 1.15–1.40)
Calcium, Ion: 1.03 mmol/L — ABNORMAL LOW (ref 1.15–1.40)
Calcium, Ion: 1.04 mmol/L — ABNORMAL LOW (ref 1.15–1.40)
Chloride: 106 mmol/L (ref 98–111)
Chloride: 106 mmol/L (ref 98–111)
Chloride: 101 mmol/L (ref 98–111)
Chloride: 103 mmol/L (ref 98–111)
Chloride: 99 mmol/L (ref 98–111)
Creatinine, Ser: 1.1 mg/dL — ABNORMAL HIGH (ref 0.44–1.00)
Creatinine, Ser: 1.1 mg/dL — ABNORMAL HIGH (ref 0.44–1.00)
Creatinine, Ser: 1.1 mg/dL — ABNORMAL HIGH (ref 0.44–1.00)
Creatinine, Ser: 1.2 mg/dL — ABNORMAL HIGH (ref 0.44–1.00)
Creatinine, Ser: 1.2 mg/dL — ABNORMAL HIGH (ref 0.44–1.00)
Glucose, Bld: 109 mg/dL — ABNORMAL HIGH (ref 70–99)
Glucose, Bld: 112 mg/dL — ABNORMAL HIGH (ref 70–99)
Glucose, Bld: 208 mg/dL — ABNORMAL HIGH (ref 70–99)
Glucose, Bld: 236 mg/dL — ABNORMAL HIGH (ref 70–99)
Glucose, Bld: 174 mg/dL — ABNORMAL HIGH (ref 70–99)
HCT: 38 % (ref 36.0–46.0)
HCT: 36 % (ref 36.0–46.0)
HCT: 26 % — ABNORMAL LOW (ref 36.0–46.0)
HCT: 28 % — ABNORMAL LOW (ref 36.0–46.0)
HCT: 26 % — ABNORMAL LOW (ref 36.0–46.0)
Hemoglobin: 12.9 g/dL (ref 12.0–15.0)
Hemoglobin: 12.2 g/dL (ref 12.0–15.0)
Hemoglobin: 8.8 g/dL — ABNORMAL LOW (ref 12.0–15.0)
Hemoglobin: 9.5 g/dL — ABNORMAL LOW (ref 12.0–15.0)
Hemoglobin: 8.8 g/dL — ABNORMAL LOW (ref 12.0–15.0)
Potassium: 3.9 mmol/L (ref 3.5–5.1)
Potassium: 3.9 mmol/L (ref 3.5–5.1)
Potassium: 3.3 mmol/L — ABNORMAL LOW (ref 3.5–5.1)
Potassium: 4.3 mmol/L (ref 3.5–5.1)
Potassium: 4.3 mmol/L (ref 3.5–5.1)
Sodium: 140 mmol/L (ref 135–145)
Sodium: 140 mmol/L (ref 135–145)
Sodium: 138 mmol/L (ref 135–145)
Sodium: 138 mmol/L (ref 135–145)
Sodium: 136 mmol/L (ref 135–145)
TCO2: 25 mmol/L (ref 22–32)
TCO2: 27 mmol/L (ref 22–32)
TCO2: 20 mmol/L — ABNORMAL LOW (ref 22–32)
TCO2: 23 mmol/L (ref 22–32)
TCO2: 25 mmol/L (ref 22–32)

## 2021-01-08 LAB — I-STAT CHEM 8, ED
BUN: 12 mg/dL (ref 6–20)
Calcium, Ion: 1.08 mmol/L — ABNORMAL LOW (ref 1.15–1.40)
Chloride: 105 mmol/L (ref 98–111)
Creatinine, Ser: 1.5 mg/dL — ABNORMAL HIGH (ref 0.44–1.00)
Glucose, Bld: 95 mg/dL (ref 70–99)
HCT: 39 % (ref 36.0–46.0)
Hemoglobin: 13.3 g/dL (ref 12.0–15.0)
Potassium: 4 mmol/L (ref 3.5–5.1)
Sodium: 140 mmol/L (ref 135–145)
TCO2: 27 mmol/L (ref 22–32)

## 2021-01-08 LAB — COMPREHENSIVE METABOLIC PANEL
ALT: 14 U/L (ref 0–44)
AST: 18 U/L (ref 15–41)
Albumin: 3.5 g/dL (ref 3.5–5.0)
Alkaline Phosphatase: 60 U/L (ref 38–126)
Anion gap: 8 (ref 5–15)
BUN: 9 mg/dL (ref 6–20)
CO2: 26 mmol/L (ref 22–32)
Calcium: 8.6 mg/dL — ABNORMAL LOW (ref 8.9–10.3)
Chloride: 105 mmol/L (ref 98–111)
Creatinine, Ser: 1.35 mg/dL — ABNORMAL HIGH (ref 0.44–1.00)
GFR, Estimated: 48 mL/min — ABNORMAL LOW (ref >60)
Glucose, Bld: 100 mg/dL — ABNORMAL HIGH (ref 70–99)
Potassium: 3.5 mmol/L (ref 3.5–5.1)
Sodium: 139 mmol/L (ref 135–145)
Total Bilirubin: 0.9 mg/dL (ref 0.3–1.2)
Total Protein: 5.8 g/dL — ABNORMAL LOW (ref 6.5–8.1)

## 2021-01-08 LAB — LIPID PANEL
Cholesterol: 253 mg/dL — ABNORMAL HIGH (ref 0–200)
HDL: 77 mg/dL (ref >40)
LDL Cholesterol: 166 mg/dL — ABNORMAL HIGH (ref 0–99)
Total CHOL/HDL Ratio: 3.3 RATIO
Triglycerides: 48 mg/dL (ref <150)
VLDL: 10 mg/dL (ref 0–40)

## 2021-01-08 LAB — RESP PANEL BY RT-PCR (FLU A&B, COVID) ARPGX2
Influenza A by PCR: NEGATIVE
Influenza B by PCR: NEGATIVE
SARS Coronavirus 2 by RT PCR: NEGATIVE

## 2021-01-08 LAB — PREPARE RBC (CROSSMATCH)

## 2021-01-08 LAB — CBC WITH DIFFERENTIAL/PLATELET
Abs Immature Granulocytes: 0.03 10*3/uL (ref 0.00–0.07)
Basophils Absolute: 0 10*3/uL (ref 0.0–0.1)
Basophils Relative: 0 %
Eosinophils Absolute: 0.1 10*3/uL (ref 0.0–0.5)
Eosinophils Relative: 2 %
HCT: 42.8 % (ref 36.0–46.0)
Hemoglobin: 13.6 g/dL (ref 12.0–15.0)
Immature Granulocytes: 0 %
Lymphocytes Relative: 19 %
Lymphs Abs: 1.6 10*3/uL (ref 0.7–4.0)
MCH: 25.8 pg — ABNORMAL LOW (ref 26.0–34.0)
MCHC: 31.8 g/dL (ref 30.0–36.0)
MCV: 81.1 fL (ref 80.0–100.0)
Monocytes Absolute: 0.6 10*3/uL (ref 0.1–1.0)
Monocytes Relative: 8 %
Neutro Abs: 6 10*3/uL (ref 1.7–7.7)
Neutrophils Relative %: 71 %
Platelets: 255 10*3/uL (ref 150–400)
RBC: 5.28 MIL/uL — ABNORMAL HIGH (ref 3.87–5.11)
RDW: 15.3 % (ref 11.5–15.5)
WBC: 8.4 10*3/uL (ref 4.0–10.5)
nRBC: 0 % (ref 0.0–0.2)

## 2021-01-08 LAB — HEMOGLOBIN AND HEMATOCRIT, BLOOD
HCT: 27.1 % — ABNORMAL LOW (ref 36.0–46.0)
Hemoglobin: 8.8 g/dL — ABNORMAL LOW (ref 12.0–15.0)

## 2021-01-08 LAB — PLATELET COUNT: Platelets: 156 10*3/uL (ref 150–400)

## 2021-01-08 LAB — TROPONIN I (HIGH SENSITIVITY)
Troponin I (High Sensitivity): 8 ng/L (ref <18)
Troponin I (High Sensitivity): 10 ng/L (ref <18)

## 2021-01-08 LAB — HEMOGLOBIN A1C
Hgb A1c MFr Bld: 5.5 % (ref 4.8–5.6)
Mean Plasma Glucose: 111.15 mg/dL

## 2021-01-08 LAB — APTT: aPTT: 30 seconds (ref 24–36)

## 2021-01-08 LAB — BRAIN NATRIURETIC PEPTIDE: B Natriuretic Peptide: 34.3 pg/mL (ref 0.0–100.0)

## 2021-01-08 LAB — PROTIME-INR
INR: 1.1 (ref 0.8–1.2)
Prothrombin Time: 14 seconds (ref 11.4–15.2)

## 2021-01-08 LAB — FIBRINOGEN: Fibrinogen: 175 mg/dL — ABNORMAL LOW (ref 210–475)

## 2021-01-08 SURGERY — REPAIR, ANEURYSM, AORTA, THORACIC, ASCENDING
Anesthesia: General

## 2021-01-08 SURGERY — LEFT HEART CATH AND CORONARY ANGIOGRAPHY
Anesthesia: LOCAL

## 2021-01-08 SURGERY — REPAIR, AORTIC DISSECTION, ASCENDING
Anesthesia: General

## 2021-01-08 MED ORDER — TRANEXAMIC ACID (OHS) PUMP PRIME SOLUTION
2.0000 mg/kg | INTRAVENOUS | Status: DC
  Filled 2021-01-08: qty 1.91

## 2021-01-08 MED ORDER — HEMOSTATIC AGENTS (NO CHARGE) OPTIME
TOPICAL | Status: DC | PRN
  Administered 2021-01-08: 1 via TOPICAL

## 2021-01-08 MED ORDER — FENTANYL CITRATE (PF) 250 MCG/5ML IJ SOLN
INTRAMUSCULAR | Status: AC
  Filled 2021-01-08: qty 20

## 2021-01-08 MED ORDER — POTASSIUM CHLORIDE 2 MEQ/ML IV SOLN
80.0000 meq | INTRAVENOUS | Status: DC
  Filled 2021-01-08: qty 40

## 2021-01-08 MED ORDER — MILRINONE LACTATE IN DEXTROSE 20-5 MG/100ML-% IV SOLN
0.3000 ug/kg/min | INTRAVENOUS | Status: DC
  Filled 2021-01-08: qty 100

## 2021-01-08 MED ORDER — SODIUM CHLORIDE 0.9 % IV SOLN
250.0000 mL | INTRAVENOUS | Status: DC
  Administered 2021-01-10: 500 mL via INTRAVENOUS

## 2021-01-08 MED ORDER — PHENYLEPHRINE 40 MCG/ML (10ML) SYRINGE FOR IV PUSH (FOR BLOOD PRESSURE SUPPORT)
PREFILLED_SYRINGE | INTRAVENOUS | Status: AC
  Filled 2021-01-08: qty 10

## 2021-01-08 MED ORDER — HEPARIN SODIUM (PORCINE) 1000 UNIT/ML IJ SOLN
INTRAMUSCULAR | Status: AC
  Filled 2021-01-08: qty 1

## 2021-01-08 MED ORDER — LIDOCAINE 2% (20 MG/ML) 5 ML SYRINGE
INTRAMUSCULAR | Status: AC
  Filled 2021-01-08: qty 5

## 2021-01-08 MED ORDER — PLASMA-LYTE A IV SOLN
INTRAVENOUS | Status: DC
  Filled 2021-01-08: qty 5

## 2021-01-08 MED ORDER — SUCCINYLCHOLINE CHLORIDE 200 MG/10ML IV SOSY
PREFILLED_SYRINGE | INTRAVENOUS | Status: AC
  Filled 2021-01-08: qty 10

## 2021-01-08 MED ORDER — PROPOFOL 10 MG/ML IV BOLUS
INTRAVENOUS | Status: DC | PRN
  Administered 2021-01-08: 40 mg via INTRAVENOUS

## 2021-01-08 MED ORDER — NOREPINEPHRINE 4 MG/250ML-% IV SOLN
0.0000 ug/min | INTRAVENOUS | Status: DC
  Filled 2021-01-08: qty 250

## 2021-01-08 MED ORDER — MANNITOL 20 % IV SOLN
INTRAVENOUS | Status: DC
  Filled 2021-01-08: qty 13

## 2021-01-08 MED ORDER — LIDOCAINE 2% (20 MG/ML) 5 ML SYRINGE
INTRAMUSCULAR | Status: DC | PRN
  Administered 2021-01-08: 60 mg via INTRAVENOUS

## 2021-01-08 MED ORDER — SODIUM CHLORIDE 0.9 % IV SOLN
INTRAVENOUS | Status: DC
Start: 2021-01-08 — End: 2021-01-17

## 2021-01-08 MED ORDER — PROPOFOL 10 MG/ML IV BOLUS
INTRAVENOUS | Status: AC
  Filled 2021-01-08: qty 20

## 2021-01-08 MED ORDER — SODIUM CHLORIDE 0.9% IV SOLUTION: Freq: Once | INTRAVENOUS | Status: DC

## 2021-01-08 MED ORDER — ROCURONIUM BROMIDE 10 MG/ML (PF) SYRINGE
PREFILLED_SYRINGE | INTRAVENOUS | Status: DC | PRN
  Administered 2021-01-08: 50 mg via INTRAVENOUS
  Administered 2021-01-08 (×2): 100 mg via INTRAVENOUS
  Administered 2021-01-08: 50 mg via INTRAVENOUS

## 2021-01-08 MED ORDER — DEXMEDETOMIDINE HCL IN NACL 400 MCG/100ML IV SOLN
0.1000 ug/kg/h | INTRAVENOUS | Status: AC
  Administered 2021-01-08: .4 ug/kg/h via INTRAVENOUS
  Filled 2021-01-08: qty 100

## 2021-01-08 MED ORDER — TRANEXAMIC ACID (OHS) BOLUS VIA INFUSION
15.0000 mg/kg | INTRAVENOUS | Status: AC
  Administered 2021-01-08: 1429.5 mg via INTRAVENOUS
  Filled 2021-01-08: qty 1430

## 2021-01-08 MED ORDER — ROCURONIUM BROMIDE 10 MG/ML (PF) SYRINGE
PREFILLED_SYRINGE | INTRAVENOUS | Status: AC
  Filled 2021-01-08: qty 40

## 2021-01-08 MED ORDER — HEPARIN (PORCINE) 25000 UT/250ML-% IV SOLN: 1250.0000 [IU]/h | INTRAVENOUS | Status: DC

## 2021-01-08 MED ORDER — MIDAZOLAM HCL 5 MG/5ML IJ SOLN
INTRAMUSCULAR | Status: DC | PRN
  Administered 2021-01-08 (×2): 3 mg via INTRAVENOUS
  Administered 2021-01-08: 4 mg via INTRAVENOUS

## 2021-01-08 MED ORDER — HEPARIN BOLUS VIA INFUSION
4000.0000 [IU] | Freq: Once | INTRAVENOUS | Status: DC
  Filled 2021-01-08: qty 4000

## 2021-01-08 MED ORDER — HEPARIN SODIUM (PORCINE) 5000 UNIT/ML IJ SOLN
4000.0000 [IU] | Freq: Once | INTRAMUSCULAR | Status: AC
  Administered 2021-01-08: 4000 [IU] via INTRAVENOUS

## 2021-01-08 MED ORDER — PROTAMINE SULFATE 10 MG/ML IV SOLN
INTRAVENOUS | Status: AC
  Filled 2021-01-08: qty 25

## 2021-01-08 MED ORDER — ARTIFICIAL TEARS OPHTHALMIC OINT
TOPICAL_OINTMENT | OPHTHALMIC | Status: AC
  Filled 2021-01-08: qty 3.5

## 2021-01-08 MED ORDER — PLASMA-LYTE A IV SOLN
INTRAVENOUS | Status: DC | PRN
  Administered 2021-01-08: 1000 mL

## 2021-01-08 MED ORDER — ETOMIDATE 2 MG/ML IV SOLN
INTRAVENOUS | Status: DC | PRN
Start: 2021-01-08 — End: 2021-01-09
  Administered 2021-01-08: 20 mg via INTRAVENOUS

## 2021-01-08 MED ORDER — FENTANYL CITRATE PF 50 MCG/ML IJ SOSY: 50.0000 ug | PREFILLED_SYRINGE | Freq: Once | INTRAMUSCULAR | Status: DC

## 2021-01-08 MED ORDER — CEFAZOLIN SODIUM-DEXTROSE 2-4 GM/100ML-% IV SOLN
2.0000 g | INTRAVENOUS | Status: AC
  Administered 2021-01-08 (×2): 2 g via INTRAVENOUS
  Filled 2021-01-08: qty 100

## 2021-01-08 MED ORDER — IOHEXOL 350 MG/ML SOLN
75.0000 mL | Freq: Once | INTRAVENOUS | Status: AC | PRN
  Administered 2021-01-08: 75 mL via INTRAVENOUS

## 2021-01-08 MED ORDER — FENTANYL CITRATE (PF) 100 MCG/2ML IJ SOLN: INTRAMUSCULAR | Status: DC | PRN

## 2021-01-08 MED ORDER — MIDAZOLAM HCL (PF) 10 MG/2ML IJ SOLN
INTRAMUSCULAR | Status: AC
  Filled 2021-01-08: qty 2

## 2021-01-08 MED ORDER — FENTANYL CITRATE (PF) 250 MCG/5ML IJ SOLN
INTRAMUSCULAR | Status: AC
  Filled 2021-01-08: qty 15

## 2021-01-08 MED ORDER — LACTATED RINGERS IV BOLUS
1000.0000 mL | Freq: Once | INTRAVENOUS | Status: AC
  Administered 2021-01-08: 1000 mL via INTRAVENOUS

## 2021-01-08 MED ORDER — PHENYLEPHRINE HCL-NACL 20-0.9 MG/250ML-% IV SOLN
30.0000 ug/min | INTRAVENOUS | Status: AC
  Administered 2021-01-08: 20 ug/min via INTRAVENOUS
  Filled 2021-01-08: qty 250

## 2021-01-08 MED ORDER — EPINEPHRINE HCL 5 MG/250ML IV SOLN IN NS
0.0000 ug/min | INTRAVENOUS | Status: DC
  Filled 2021-01-08: qty 250

## 2021-01-08 MED ORDER — NOREPINEPHRINE 4 MG/250ML-% IV SOLN: 2.0000 ug/min | INTRAVENOUS | Status: DC

## 2021-01-08 MED ORDER — INSULIN REGULAR(HUMAN) IN NACL 100-0.9 UT/100ML-% IV SOLN
INTRAVENOUS | Status: AC
  Administered 2021-01-08: 1 [IU]/h via INTRAVENOUS
  Filled 2021-01-08: qty 100

## 2021-01-08 MED ORDER — SUCCINYLCHOLINE CHLORIDE 200 MG/10ML IV SOSY
PREFILLED_SYRINGE | INTRAVENOUS | Status: DC | PRN
  Administered 2021-01-08: 140 mg via INTRAVENOUS

## 2021-01-08 MED ORDER — FENTANYL CITRATE (PF) 250 MCG/5ML IJ SOLN
INTRAMUSCULAR | Status: DC | PRN
  Administered 2021-01-08: 100 ug via INTRAVENOUS
  Administered 2021-01-08: 300 ug via INTRAVENOUS
  Administered 2021-01-08: 50 ug via INTRAVENOUS
  Administered 2021-01-08: 250 ug via INTRAVENOUS
  Administered 2021-01-08: 50 ug via INTRAVENOUS
  Administered 2021-01-08: 100 ug via INTRAVENOUS
  Administered 2021-01-08 (×2): 250 ug via INTRAVENOUS
  Administered 2021-01-08: 150 ug via INTRAVENOUS
  Administered 2021-01-08 (×2): 100 ug via INTRAVENOUS
  Administered 2021-01-08: 450 ug via INTRAVENOUS
  Administered 2021-01-09: 100 ug via INTRAVENOUS

## 2021-01-08 MED ORDER — TRANEXAMIC ACID 1000 MG/10ML IV SOLN
1.5000 mg/kg/h | INTRAVENOUS | Status: AC
  Administered 2021-01-08: 1.5 mg/kg/h via INTRAVENOUS
  Filled 2021-01-08: qty 25

## 2021-01-08 MED ORDER — LACTATED RINGERS IV SOLN: INTRAVENOUS | Status: DC | PRN

## 2021-01-08 MED ORDER — FENTANYL CITRATE (PF) 250 MCG/5ML IJ SOLN
INTRAMUSCULAR | Status: AC
  Filled 2021-01-08: qty 10

## 2021-01-08 MED ORDER — ETOMIDATE 2 MG/ML IV SOLN
INTRAVENOUS | Status: AC
  Filled 2021-01-08: qty 20

## 2021-01-08 MED ORDER — CEFAZOLIN SODIUM-DEXTROSE 2-4 GM/100ML-% IV SOLN
2.0000 g | INTRAVENOUS | Status: DC
  Filled 2021-01-08: qty 100

## 2021-01-08 MED ORDER — 0.9 % SODIUM CHLORIDE (POUR BTL) OPTIME
TOPICAL | Status: DC | PRN
  Administered 2021-01-08: 5000 mL

## 2021-01-08 MED ORDER — HEPARIN 30,000 UNITS/1000 ML (OHS) CELLSAVER SOLUTION
Status: DC
  Filled 2021-01-08: qty 1000

## 2021-01-08 MED ORDER — PROTAMINE SULFATE 10 MG/ML IV SOLN
INTRAVENOUS | Status: DC | PRN
  Administered 2021-01-08: 270 mg via INTRAVENOUS

## 2021-01-08 MED ORDER — SODIUM CHLORIDE 0.9% FLUSH
3.0000 mL | Freq: Two times a day (BID) | INTRAVENOUS | Status: DC
  Administered 2021-01-09 – 2021-01-17 (×9): 3 mL via INTRAVENOUS

## 2021-01-08 MED ORDER — HEPARIN SODIUM (PORCINE) 1000 UNIT/ML IJ SOLN
INTRAMUSCULAR | Status: DC | PRN
  Administered 2021-01-08: 5000 [IU] via INTRAVENOUS
  Administered 2021-01-08: 24000 [IU] via INTRAVENOUS

## 2021-01-08 MED ORDER — NITROGLYCERIN IN D5W 200-5 MCG/ML-% IV SOLN
2.0000 ug/min | INTRAVENOUS | Status: DC
  Filled 2021-01-08: qty 250

## 2021-01-08 MED ORDER — PROTAMINE SULFATE 10 MG/ML IV SOLN
INTRAVENOUS | Status: AC
  Filled 2021-01-08: qty 10

## 2021-01-08 MED ORDER — HEPARIN SODIUM (PORCINE) 5000 UNIT/ML IJ SOLN: 60.0000 [IU]/kg | Freq: Once | INTRAMUSCULAR | Status: DC

## 2021-01-08 MED ORDER — VANCOMYCIN HCL 1500 MG/300ML IV SOLN
1500.0000 mg | INTRAVENOUS | Status: AC
  Administered 2021-01-08: 1500 mg via INTRAVENOUS
  Filled 2021-01-08: qty 300

## 2021-01-08 SURGICAL SUPPLY — 103 items
APPLICATOR TIP COSEAL (VASCULAR PRODUCTS) ×1 IMPLANT
ATTRACTOMAT 16X20 MAGNETIC DRP (DRAPES) ×2 IMPLANT
BAG DECANTER FOR FLEXI CONT (MISCELLANEOUS) ×2 IMPLANT
BLADE CLIPPER SURG (BLADE) ×2 IMPLANT
BLADE STERNUM SYSTEM 6 (BLADE) ×2 IMPLANT
BLADE SURG 15 STRL LF DISP TIS (BLADE) ×1 IMPLANT
BLADE SURG 15 STRL SS (BLADE) ×2
CANISTER SUCT 3000ML PPV (MISCELLANEOUS) ×2 IMPLANT
CANISTER WOUND CARE 500ML ATS (WOUND CARE) ×1 IMPLANT
CANNULA GUNDRY RCSP 15FR (MISCELLANEOUS) ×3 IMPLANT
CANNULA MC2 2 STG 36/46 NON-V (CANNULA) IMPLANT
CANNULA NON VENT 22FR 12 (CANNULA) ×1 IMPLANT
CANNULA SUMP PERICARDIAL (CANNULA) ×1 IMPLANT
CANNULA VENOUS 2 STG 34/46 (CANNULA) ×2
CATH HEART VENT LEFT (CATHETERS) ×1 IMPLANT
CATH RETROPLEGIA CORONARY 14FR (CATHETERS) ×1 IMPLANT
CATH ROBINSON RED A/P 18FR (CATHETERS) ×5 IMPLANT
CLIP TI LARGE 6 (CLIP) ×1 IMPLANT
CNTNR URN SCR LID CUP LEK RST (MISCELLANEOUS) ×1 IMPLANT
CONNECTOR BLAKE 2:1 CARIO BLK (MISCELLANEOUS) ×1 IMPLANT
CONT SPEC 4OZ STRL OR WHT (MISCELLANEOUS) ×4
CONTAINER PROTECT SURGISLUSH (MISCELLANEOUS) ×3 IMPLANT
COVER SURGICAL LIGHT HANDLE (MISCELLANEOUS) ×4 IMPLANT
DERMABOND ADVANCED (GAUZE/BANDAGES/DRESSINGS) ×1
DERMABOND ADVANCED .7 DNX12 (GAUZE/BANDAGES/DRESSINGS) IMPLANT
DRAIN CHANNEL 19F RND (DRAIN) ×3 IMPLANT
DRAIN CONNECTOR BLAKE 1:1 (MISCELLANEOUS) ×1 IMPLANT
DRAPE CARDIOVASCULAR INCISE (DRAPES) ×2
DRAPE SRG 135X102X78XABS (DRAPES) ×1 IMPLANT
DRAPE WARM FLUID 44X44 (DRAPES) IMPLANT
DRESSING PEEL AND PLAC PRVNA20 (GAUZE/BANDAGES/DRESSINGS) IMPLANT
DRSG AQUACEL AG ADV 3.5X10 (GAUZE/BANDAGES/DRESSINGS) ×1 IMPLANT
DRSG PEEL AND PLACE PREVENA 20 (GAUZE/BANDAGES/DRESSINGS) ×2
ELECT CAUTERY BLADE 6.4 (BLADE) ×2 IMPLANT
ELECT REM PT RETURN 9FT ADLT (ELECTROSURGICAL) ×4
ELECTRODE REM PT RTRN 9FT ADLT (ELECTROSURGICAL) ×2 IMPLANT
FELT TEFLON 1X6 (MISCELLANEOUS) ×1 IMPLANT
GAUZE SPONGE 4X4 12PLY STRL (GAUZE/BANDAGES/DRESSINGS) ×2 IMPLANT
GLOVE SURG ENC MOIS LTX SZ6 (GLOVE) IMPLANT
GLOVE SURG ENC MOIS LTX SZ6.5 (GLOVE) IMPLANT
GLOVE SURG ENC MOIS LTX SZ7 (GLOVE) IMPLANT
GLOVE SURG ENC MOIS LTX SZ7.5 (GLOVE) IMPLANT
GLOVE SURG MICRO LTX SZ6 (GLOVE) ×5 IMPLANT
GLOVE SURG MICRO LTX SZ7 (GLOVE) ×1 IMPLANT
GLOVE SURG POLYISO LF SZ7.5 (GLOVE) ×1 IMPLANT
GLOVE SURG PR MICRO ENCORE 7.5 (GLOVE) ×1 IMPLANT
GOWN STRL REUS W/ TWL LRG LVL3 (GOWN DISPOSABLE) ×4 IMPLANT
GOWN STRL REUS W/ TWL XL LVL3 (GOWN DISPOSABLE) ×1 IMPLANT
GOWN STRL REUS W/TWL LRG LVL3 (GOWN DISPOSABLE) ×8
GOWN STRL REUS W/TWL XL LVL3 (GOWN DISPOSABLE) ×2
GRAFT CV 30X8WVN NDL (Graft) IMPLANT
GRAFT HEMASHIELD 8MM (Graft) ×2 IMPLANT
GRAFT WOVEN D/V 28DX30L (Vascular Products) ×1 IMPLANT
INSERT FOGARTY SM (MISCELLANEOUS) ×2 IMPLANT
INSERT FOGARTY XLG (MISCELLANEOUS) ×1 IMPLANT
IV ADAPTER SYR DOUBLE MALE LL (MISCELLANEOUS) ×1 IMPLANT
KIT BASIN OR (CUSTOM PROCEDURE TRAY) ×2 IMPLANT
KIT SUCTION CATH 14FR (SUCTIONS) ×2 IMPLANT
KIT TURNOVER KIT B (KITS) ×2 IMPLANT
LEAD PACING MYOCARDI (MISCELLANEOUS) ×1 IMPLANT
LINE VENT (MISCELLANEOUS) ×1 IMPLANT
NS IRRIG 1000ML POUR BTL (IV SOLUTION) ×10 IMPLANT
PACK E OPEN HEART (SUTURE) ×2 IMPLANT
PACK OPEN HEART (CUSTOM PROCEDURE TRAY) ×2 IMPLANT
PAD ARMBOARD 7.5X6 YLW CONV (MISCELLANEOUS) ×4 IMPLANT
POSITIONER HEAD DONUT 9IN (MISCELLANEOUS) ×2 IMPLANT
SEALANT SURG COSEAL 8ML (VASCULAR PRODUCTS) ×1 IMPLANT
SET MPS 3-ND DEL (MISCELLANEOUS) ×1 IMPLANT
SPONGE T-LAP 18X18 ~~LOC~~+RFID (SPONGE) ×1 IMPLANT
SUT BONE WAX W31G (SUTURE) ×2 IMPLANT
SUT EB EXC GRN/WHT 2-0 V-5 (SUTURE) ×4 IMPLANT
SUT ETHIBON EXCEL 2-0 V-5 (SUTURE) IMPLANT
SUT ETHIBOND V-5 VALVE (SUTURE) IMPLANT
SUT MNCRL AB 3-0 PS2 18 (SUTURE) ×1 IMPLANT
SUT PROLENE 3 0 SH 1 (SUTURE) ×2 IMPLANT
SUT PROLENE 3 0 SH 48 (SUTURE) ×4 IMPLANT
SUT PROLENE 3 0 SH DA (SUTURE) IMPLANT
SUT PROLENE 4 0 RB 1 (SUTURE) ×18
SUT PROLENE 4 0 SH DA (SUTURE) ×25 IMPLANT
SUT PROLENE 4-0 RB1 .5 CRCL 36 (SUTURE) ×4 IMPLANT
SUT PROLENE 5 0 C 1 36 (SUTURE) ×2 IMPLANT
SUT PROLENE 5 0 RB 2 (SUTURE) ×4 IMPLANT
SUT STEEL 6MS V (SUTURE) ×3 IMPLANT
SUT STEEL STERNAL CCS#1 18IN (SUTURE) IMPLANT
SUT STEEL SZ 6 DBL 3X14 BALL (SUTURE) IMPLANT
SUT VIC AB 1 CTX 36 (SUTURE) ×4
SUT VIC AB 1 CTX36XBRD ANBCTR (SUTURE) ×2 IMPLANT
SUT VIC AB 2-0 CT1 27 (SUTURE) ×2
SUT VIC AB 2-0 CT1 TAPERPNT 27 (SUTURE) IMPLANT
SUT VIC AB 3-0 X1 27 (SUTURE) IMPLANT
SYSTEM SAHARA CHEST DRAIN ATS (WOUND CARE) ×2 IMPLANT
TAPE CLOTH SURG 4X10 WHT LF (GAUZE/BANDAGES/DRESSINGS) ×1 IMPLANT
TAPE PAPER 2X10 WHT MICROPORE (GAUZE/BANDAGES/DRESSINGS) ×1 IMPLANT
TOWEL GREEN STERILE (TOWEL DISPOSABLE) ×2 IMPLANT
TOWEL GREEN STERILE FF (TOWEL DISPOSABLE) ×2 IMPLANT
TRAY FOLEY SLVR 14FR TEMP STAT (SET/KITS/TRAYS/PACK) ×2 IMPLANT
TUBE CONNECTING 12X1/4 (SUCTIONS) ×1 IMPLANT
TUBING ART PRESS 72  MALE/FEM (TUBING) ×4
TUBING ART PRESS 72 MALE/FEM (TUBING) IMPLANT
UNDERPAD 30X36 HEAVY ABSORB (UNDERPADS AND DIAPERS) ×2 IMPLANT
VENT LEFT HEART 12002 (CATHETERS) ×4
WATER STERILE IRR 1000ML POUR (IV SOLUTION) ×4 IMPLANT
YANKAUER SUCT BULB TIP NO VENT (SUCTIONS) ×2 IMPLANT

## 2021-01-08 NOTE — H&P (Signed)
Cardiology H&P:   Patient ID: Catherine Mcdonald MRN: [id number]; DOB: 10-11-1970  Admit date: 01/08/2021 Date of Consult: 01/08/2021  PCP:  Robyne Peers, MD   [place] [facility] Cardiologist:  Pixie Casino, MD     Patient Profile:   Catherine Mcdonald is a 50 y.o. female with a hx of HTN, s/p hysterectomy who is being seen 01/08/2021 for the evaluation of chest pain at the request of Dr. Armandina Gemma.  History of Present Illness:   Catherine Mcdonald is a 50 yo female with PMH noted above. Denies any family hx of CAD. Reports she was evaluated by cardiology through [facility] in 2019 and underwent stress testing (treadmill) and echo. Sounds like things looked ok. Unable to find the results in care everywhere. Since that time she has been in her usual state of health. She works as a Physiological scientist at [employer]. She is very active, never has anginal symptoms with coaching. She followed with a PCP on a yearly basis, only on norvasc PTA.   States she was in her usual state of health until this afternoon around 1pm when she developed sudden onset of right sided chest pain while brushing her hair. Radiated across into the center of the chest. EMS was called. BP noted 90/60 which improved with 500cc saline bolus. Given 324 ASA.   On arrival to the ED initially called a STEMI, but this was canceled. EKG showed SR 61 bpm, biphasic TW in inferolateral leads, slight ST depression in inferior leads. Labs showed Na + 139, K+ 3.5, Cr 1.35, WBC 8.4, Hgb 13.6, hsTn 8. Still complaining of 8/10 chest pain at the time of assessment. Pending CT angio for PE rule out.    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 Authorizing Provider  amLODipine (NORVASC) 5 MG tablet Take 5 mg by mouth daily.   Yes [provider]  psyllium (METAMUCIL) 58.6 % powder Take 1 packet by mouth daily.   Yes [provider]    Inpatient Medications: Scheduled Meds:  Continuous Infusions:  sodium chloride     sodium chloride     heparin     PRN Meds:   Allergies:   No Known Allergies  Social History:   Social History   Socioeconomic History   Marital status: Married    Spouse name: Not on file   Number of children: Not on file   Years of education: Not on file   Highest education level: Not on file  Occupational History   Not  on file  Tobacco Use   Smoking status: Never   Smokeless tobacco: Never  Substance and Sexual Activity   Alcohol use: Yes    Comment: RARE   Drug use: No   Sexual activity: Yes    Birth control/protection: Condom  Other Topics Concern   Not on file  Social History Narrative   Not on file   Social Determinants of Health   Financial Resource Strain: Not on file  Food Insecurity: Not on file  Transportation Needs: Not on file  Physical Activity: Not on file  Stress: Not on file  Social Connections: Not on file  Intimate Partner Violence: Not on file    Family History:    Family History  Problem Relation Age of Onset   Diabetes Maternal Grandmother    Hypertension Maternal Grandmother    Colon  cancer Neg Hx    Esophageal cancer Neg Hx    Rectal cancer Neg Hx    Stomach cancer Neg Hx      ROS:  Please see the history of present illness.   All other ROS reviewed and negative.     Physical Exam/Data:   Vitals:   01/08/21 1545 01/08/21 1600 01/08/21 1607 01/08/21 1645  BP: (!) 95/54 (!) 100/52 (!) 100/52 (!) 98/53  Pulse:   (!) 57   Resp: 13 14 14 15   Temp:      SpO2:   100%   Weight:      Height:       No intake or output data in the 24 hours ending 01/08/21 1701 Last 3 Weights 01/08/2021 02/21/2019 01/25/2019  Weight (lbs) 210 lb 205 lb 205 lb 6.4 oz  Weight (kg) 95.255 kg 92.987 kg 93.169 kg     Body mass index is 30.13 kg/m.  General:  Well nourished, well developed, in no acute distress HEENT: normal Neck: no JVD Vascular: No carotid bruits; Distal pulses 2+ bilaterally Cardiac:  normal S1, S2; RRR; no murmur  Lungs:  clear to auscultation bilaterally, no wheezing, rhonchi or rales  Abd: soft, nontender, no hepatomegaly  Ext: no edema Musculoskeletal:  No deformities, BUE and BLE strength normal and equal Skin: warm and dry  Neuro:  CNs 2-12 intact, no focal abnormalities noted Psych:  Normal affect   EKG:  The EKG was personally reviewed and demonstrates: SR 61 bpm, biphasic TW in inferolateral leads, slight ST depression in inferior leads   Relevant CV Studies:  N/a   Laboratory Data:  High Sensitivity Troponin:   Recent Labs  Lab 01/08/21 1416  TROPONINIHS 8     Chemistry Recent Labs  Lab 01/08/21 1427 01/08/21 1638  NA 139 140  K 3.5 4.0  CL 105 105  CO2 26  --   GLUCOSE 100* 95  BUN 9 12  CREATININE 1.35* 1.50*  CALCIUM 8.6*  --   GFRNONAA 48*  --   ANIONGAP 8  --     Recent Labs  Lab 01/08/21 1427  PROT 5.8*  ALBUMIN 3.5  AST 18  ALT 14  ALKPHOS 60  BILITOT 0.9   Lipids  Recent Labs  Lab 01/08/21 1435  CHOL 253*  TRIG 48  HDL 77  LDLCALC 166*  CHOLHDL 3.3    Hematology Recent Labs  Lab 01/08/21 1427  01/08/21 1638  WBC 8.4  --   RBC 5.28*  --   HGB 13.6 13.3  HCT 42.8 39.0  MCV 81.1  --   MCH 25.8*  --  MCHC 31.8  --   RDW 15.3  --   PLT 255  --    Thyroid No results for input(s): TSH, FREET4 in the last 168 hours.  BNPNo results for input(s): BNP, PROBNP in the last 168 hours.  DDimer No results for input(s): DDIMER in the last 168 hours.   Radiology/Studies:  DG Chest Port 1 View  Result Date: 01/08/2021 CLINICAL DATA:  STEMI EXAM: PORTABLE CHEST 1 VIEW COMPARISON:  Chest x-ray dated April 21, 2009 FINDINGS: Cardiac and mediastinal contours are within normal limits for AP technique. Mild bilateral heterogeneous opacities. No large pleural effusion or pneumothorax. IMPRESSION: Mild bilateral heterogeneous opacities, differential includes pulmonary edema versus atelectasis. Electronically Signed   By: Yetta Glassman M.D.   On: 01/08/2021 15:03     Assessment and Plan:   SIREN PORRATA is a 50 y.o. female with a hx of HTN, s/p hysterectomy who is being seen 01/08/2021 for the evaluation of chest pain at the request of Dr. Armandina Gemma.  Chest pain with abnormal EKG: Sudden onset this afternoon around 1pm while at rest brushing her hair. EKG is abnormal with biphasic TW in inferolateral leads and slight ST depression in inferior leads. Initial hsTn 8, repeat pending. Started on IV heparin in the ED. RFs of HTN, suspect HLD on review of labs from 2020 (not on statin). She is pending a CT angio -- IV heparin -- add ASA, statin -- discussed with MD, with abnormal EKG and on-going pain plan for cardiac cath. Consent was obtained by Dr. Debara Pickett.   HTN: on norvasc 5mg  daily  -- on with soft BPs   HLD: LDL 166 (not fasting) -- will recheck lipids in am -- add statin   S/p hysterectomy: reports she had severe anemia in the setting of surgery.   Risk Assessment/Risk Scores:   HEAR Score (for undifferentiated chest pain):  HEAR Score: 2{  For questions or updates, please  contact [place] Please consult [email or web address] for contact info under    Signed, Reino Bellis, NP  01/08/2021 5:01 PM

## 2021-01-08 NOTE — Anesthesia Procedure Notes (Signed)
Central Venous Catheter Insertion Performed by: Albertha Ghee, MD, anesthesiologist Start/End10/28/2022 6:30 PM, 01/08/2021 6:31 PM Patient location: Pre-op. Preanesthetic checklist: patient identified, IV checked, site marked, risks and benefits discussed, surgical consent, monitors and equipment checked, pre-op evaluation, timeout performed and anesthesia consent Hand hygiene performed  and maximum sterile barriers used  PA cath was placed.Swan type:thermodilution Procedure performed without using ultrasound guided technique. Attempts: 1 Patient tolerated the procedure well with no immediate complications.

## 2021-01-08 NOTE — Progress Notes (Signed)
ANTICOAGULATION CONSULT NOTE - Initial Consult  Pharmacy Consult for Heparin Indication: chest pain/ACS  No Known Allergies  Patient Measurements: Height: 5\' 10"  (177.8 cm) Weight: 95.3 kg (210 lb) IBW/kg (Calculated) : 68.5 Heparin Dosing Weight:  88.5 kg  Vital Signs: Temp: 97.8 F (36.6 C) (10/28 1414) BP: 96/49 (10/28 1430) Pulse Rate: 57 (10/28 1430)  Labs: No results for input(s): HGB, HCT, PLT, APTT, LABPROT, INR, HEPARINUNFRC, HEPRLOWMOCWT, CREATININE, CKTOTAL, CKMB, TROPONINIHS in the last 72 hours.  CrCl cannot be calculated (Patient's most recent lab result is older than the maximum 21 days allowed.).   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 Hgb 13.6. Plts 255. No indication for anticoag PTA.   Goal of Therapy:  Heparin level 0.3-0.7 units/ml Monitor platelets by anticoagulation protocol: Yes   Plan:  Heparin 4000 unit IV bolus (given 1449 per MD in ED) Heparin infusion 1250 units/hr Will check heparin level in 6-8 hrs. Daily HL and CBC   Catherine Mcdonald, PharmD, BCPS Clinical Staff Pharmacist [email or web address] Alford Mcdonald, [employer] 01/08/2021,3:19 PM

## 2021-01-08 NOTE — Anesthesia Procedure Notes (Signed)
Arterial Line Insertion Start/End10/28/2022 6:00 PM, 01/08/2021 6:10 PM Performed by: Dorthea Cove, CRNA, CRNA  Patient location: Pre-op. Preanesthetic checklist: patient identified, IV checked, site marked, risks and benefits discussed, surgical consent, monitors and equipment checked, pre-op evaluation, timeout performed and anesthesia consent Lidocaine 1% used for infiltration Right, radial was placed Catheter size: 20 G Hand hygiene performed  and maximum sterile barriers used   Attempts: 1 Procedure performed without using ultrasound guided technique. Following insertion, dressing applied and Biopatch. Post procedure assessment: normal and unchanged  Patient tolerated the procedure well with no immediate complications.

## 2021-01-08 NOTE — Progress Notes (Signed)
Chaplain meet pt's husband in ED hallway.  He was obviously upset, trying to get his brother-in-law into ED to see pt prior to surgery.  Chaplain escorted pt's family (husband, mother, brother) to [facility] waiting area, seated inside the glassed-in area where they will remain throughout the night until surgery is over.  Chaplain brought mother a blanket and coffee.  Pt's nurse has been advised where family is waiting.    Chaplain available for further support as needed.  [place]

## 2021-01-08 NOTE — ED Notes (Signed)
CODE STEMI called at 14:39 by Kalman Shan per Triage RN

## 2021-01-08 NOTE — Anesthesia Procedure Notes (Signed)
Arterial Line Insertion Start/End10/28/2022 6:15 PM, 01/08/2021 6:17 PM Performed by: Oletta Lamas, CRNA  Patient location: Pre-op. Preanesthetic checklist: patient identified, IV checked, site marked, risks and benefits discussed, surgical consent, monitors and equipment checked, pre-op evaluation, timeout performed and anesthesia consent Lidocaine 1% used for infiltration Left, radial was placed Catheter size: 20 G Hand hygiene performed  and maximum sterile barriers used   Attempts: 1 Procedure performed without using ultrasound guided technique. Following insertion, dressing applied and Biopatch. Post procedure assessment: normal and unchanged  Patient tolerated the procedure well with no immediate complications.

## 2021-01-08 NOTE — ED Triage Notes (Signed)
Patient BIB [facility] from home for chest pain, initially BP 90/60, given 500 mL NS, 324mg  ASA PTA. 18g saline lock in left AC.  EMS  BP 90/60 HR 48-60

## 2021-01-08 NOTE — ED Notes (Signed)
CT Surgery paged at 15:13 by [facility] per Dr Roslynn Amble

## 2021-01-08 NOTE — Anesthesia Preprocedure Evaluation (Signed)
Anesthesia Evaluation  Patient identified by MRN, date of birth, ID band Patient awake    Reviewed: Allergy & Precautions, H&P , NPO status , Patient's Chart, lab work & pertinent test resultsPreop documentation limited or incomplete due to emergent nature of procedure.  Airway Mallampati: II   Neck ROM: full    Dental   Pulmonary neg pulmonary ROS,    breath sounds clear to auscultation       Cardiovascular hypertension,  Rhythm:regular Rate:Normal  Acute aortic dissection   Neuro/Psych    GI/Hepatic   Endo/Other    Renal/GU ARFRenal disease     Musculoskeletal  (+) Arthritis ,   Abdominal   Peds  Hematology   Anesthesia Other Findings   Reproductive/Obstetrics                             Anesthesia Physical Anesthesia Plan  ASA: 4 and emergent  Anesthesia Plan: General   Post-op Pain Management:    Induction: Intravenous  PONV Risk Score and Plan: 3 and Ondansetron, Dexamethasone, Midazolam and Treatment may vary due to age or medical condition  Airway Management Planned: Oral ETT  Additional Equipment: Arterial line, CVP, PA Cath, TEE and Ultrasound Guidance Line Placement  Intra-op Plan:   Post-operative Plan: Post-operative intubation/ventilation  Informed Consent: I have reviewed the patients History and Physical, chart, labs and discussed the procedure including the risks, benefits and alternatives for the proposed anesthesia with the patient or authorized representative who has indicated his/her understanding and acceptance.     Dental advisory given  Plan Discussed with: CRNA, Anesthesiologist and Surgeon  Anesthesia Plan Comments:         Anesthesia Quick Evaluation

## 2021-01-08 NOTE — ED Provider Notes (Addendum)
[facility] EMERGENCY DEPARTMENT Provider Note   CSN: [id number] Arrival date & time: 01/08/21  1405     History No chief complaint on file.   Catherine Mcdonald is a 50 y.o. female.  HPI  50 year old female presenting with acute onset crushing substernal and left-sided chest pain radiating to her left neck and shoulder.  Symptoms were onset around 1 PM.  The patient endorses diaphoresis.  She endorses nausea.  She denies any vomiting or shortness of breath.  In triage, EKG concerning for possible STEMI.  Code STEMI was initiated.  Level 5 caveat due to patient acuity.  Past Medical History:  Diagnosis Date   Allergy    Anemia    had ekg/stress test/echo done in March at [facility] d/t symptoms of anemia-cardiac r/o per pt-records requested   Arthritis    Headache(784.0)    History of blood transfusion 05/2012   [facility]   Hyperlipidemia    Hypertension    meds d/c about 1 year ago after starting exercise program with b/p controlled-pcp aware   Menorrhagia 03/01/2012   Seizures ([place])    as an infant with high fevers   Sickle cell trait [facility])     Patient Active Problem List   Diagnosis Date Noted   Thoracic aortic aneurysm 01/08/2021   Menorrhagia 03/01/2012   Anemia 03/01/2012    Past Surgical History:  Procedure Laterality Date   CESAREAN SECTION     VAGINAL HYSTERECTOMY N/A 07/12/2012   Procedure: HYSTERECTOMY VAGINAL;  Surgeon: Ena Dawley, MD;  Location: [facility];  Service: Gynecology;  Laterality: N/A;   WISDOM TOOTH EXTRACTION       OB History     Gravida  2   Para  2   Term  2   Preterm      AB      Living  2      SAB      IAB      Ectopic      Multiple      Live Births  2           Family History  Problem Relation Age of Onset   Diabetes Maternal Grandmother    Hypertension Maternal Grandmother    Colon cancer Neg Hx    Esophageal cancer Neg Hx    Rectal cancer Neg Hx    Stomach  cancer Neg Hx     Social History   Tobacco Use   Smoking status: Never   Smokeless tobacco: Never  Substance Use Topics   Alcohol use: Yes    Comment: RARE   Drug use: No    Home Medications Prior to Admission medications   Medication Sig Start Date End Date Taking? Authorizing Provider  amLODipine (NORVASC) 5 MG tablet Take 5 mg by mouth daily.   Yes [provider]  psyllium (METAMUCIL) 58.6 % powder Take 1 packet by mouth daily.   Yes [provider]    Allergies    Patient has no known allergies.  Review of Systems   Review of Systems  Unable to perform ROS: Acuity of condition  Cardiovascular:  Positive for chest pain.  Musculoskeletal:  Positive for neck pain.   Physical Exam Updated Vital Signs BP (!) 110/57   Pulse (!) 36   Temp 97.8 F (36.6 C)   Resp 16   Ht 5\' 10"  (1.778 m)   Wt 95.3 kg   LMP 05/31/2012   SpO2 100%  BMI 30.13 kg/m   Physical Exam Vitals and nursing note reviewed.  Constitutional:      General: She is in acute distress.     Appearance: She is ill-appearing and diaphoretic.     Comments: Very diaphoretic, uncomfortable, in acute pain and distress  HENT:     Head: Normocephalic and atraumatic.  Eyes:     Conjunctiva/sclera: Conjunctivae normal.     Pupils: Pupils are equal, round, and reactive to light.  Cardiovascular:     Rate and Rhythm: Regular rhythm. Bradycardia present.     Comments: Radial pulses 2+ and symmetric bilaterally Pulmonary:     Effort: Pulmonary effort is normal. No respiratory distress.  Abdominal:     General: There is no distension.     Tenderness: There is no guarding.  Musculoskeletal:        General: No deformity or signs of injury.     Cervical back: Normal range of motion and neck supple.     Right lower leg: No edema.     Left lower leg: No edema.  Skin:    Findings: No lesion or rash.  Neurological:     General: No focal deficit present.     Mental Status: She is alert.  Mental status is at baseline.    ED Results / Procedures / Treatments   Labs (all labs ordered are listed, but only abnormal results are displayed) Labs Reviewed  COMPREHENSIVE METABOLIC PANEL - Abnormal; Notable for the following components:      Result Value   Glucose, Bld 100 (*)    Creatinine, Ser 1.35 (*)    Calcium 8.6 (*)    Total Protein 5.8 (*)    GFR, Estimated 48 (*)    All other components within normal limits  CBC WITH DIFFERENTIAL/PLATELET - Abnormal; Notable for the following components:   RBC 5.28 (*)    MCH 25.8 (*)    All other components within normal limits  LIPID PANEL - Abnormal; Notable for the following components:   Cholesterol 253 (*)    LDL Cholesterol 166 (*)    All other components within normal limits  I-STAT CHEM 8, ED - Abnormal; Notable for the following components:   Creatinine, Ser 1.50 (*)    Calcium, Ion 1.08 (*)    All other components within normal limits  POCT I-STAT 7, (LYTES, BLD GAS, ICA,H+H) - Abnormal; Notable for the following components:   pO2, Arterial 460 (*)    All other components within normal limits  POCT I-STAT, CHEM 8 - Abnormal; Notable for the following components:   Creatinine, Ser 1.10 (*)    Glucose, Bld 109 (*)    All other components within normal limits  POCT I-STAT, CHEM 8 - Abnormal; Notable for the following components:   Creatinine, Ser 1.10 (*)    Glucose, Bld 112 (*)    Calcium, Ion 1.14 (*)    All other components within normal limits  POCT I-STAT 7, (LYTES, BLD GAS, ICA,H+H) - Abnormal; Notable for the following components:   pO2, Arterial 415 (*)    Calcium, Ion 1.02 (*)    HCT 29.0 (*)    Hemoglobin 9.9 (*)    All other components within normal limits  POCT I-STAT EG7 - Abnormal; Notable for the following components:   pO2, Ven 56.0 (*)    Calcium, Ion 1.07 (*)    HCT 31.0 (*)    Hemoglobin 10.5 (*)    All other components within normal limits  RESP PANEL BY RT-PCR (FLU A&B, COVID) ARPGX2   PROTIME-INR  BRAIN NATRIURETIC PEPTIDE  HEMOGLOBIN A1C  APTT  CBC  GLOBAL TEG PANEL  I-STAT BETA HCG BLOOD, ED (MC, WL, AP ONLY)  TYPE AND SCREEN  PREPARE RBC (CROSSMATCH)  SURGICAL PATHOLOGY  TROPONIN I (HIGH SENSITIVITY)  TROPONIN I (HIGH SENSITIVITY)    EKG EKG Interpretation  Date/Time:  Friday January 08 2021 14:16:49 EDT Ventricular Rate:  61 PR Interval:  172 QRS Duration: 82 QT Interval:  444 QTC Calculation: 446 R Axis:   -13 Text Interpretation: Normal sinus rhythm Possible Left atrial enlargement Septal infarct , age undetermined ST segment changes in the anterior leads and T wave flattening in the inferior leads concerning for ongoing ischemia. ST depressions in leads II, III, aVF concerning for reciprocal changes. Biphasic t-waves in V2 Abnormal ECG Reconfirmed by Regan Lemming ([id number]) on 01/08/2021 9:07:05 PM  Radiology CT Angio Chest PE W and/or Wo Contrast  Result Date: 01/08/2021 CLINICAL DATA:  Chest pain EXAM: CT ANGIOGRAPHY CHEST WITH CONTRAST TECHNIQUE: Multidetector CT imaging of the chest was performed using the standard protocol during bolus administration of intravenous contrast. Multiplanar CT image reconstructions and MIPs were obtained to evaluate the vascular anatomy. CONTRAST:  47mL OMNIPAQUE IOHEXOL 350 MG/ML SOLN COMPARISON:  01/08/2021 FINDINGS: Cardiovascular: This is a technically adequate evaluation of the pulmonary vasculature. No filling defects or pulmonary emboli. There is a type A thoracic aortic dissection, extending from the aortic root through the diaphragmatic hiatus. The distal margin of the aortic dissection is not visualized due to slice selection. The true lumen gives rise to the great vessels. I do not see any evidence of aortic rupture. Maximal diameter of the ascending thoracic aorta measures 5.1 cm. The heart is unremarkable without pericardial effusion. Mediastinum/Nodes: No enlarged mediastinal, hilar, or axillary lymph nodes.  Thyroid gland, trachea, and esophagus demonstrate no significant findings. Lungs/Pleura: No acute airspace disease, effusion, or pneumothorax. Central airways are patent. Minimal hypoventilatory changes at the lung bases. Upper Abdomen: Limited imaging through the upper abdomen demonstrates no additional findings. Musculoskeletal: There are no acute or destructive bony lesions. Reconstructed images demonstrate no additional findings. Review of the MIP images confirms the above findings. IMPRESSION: 1. 5.1 cm ascending thoracic aortic aneurysm, with superimposed type A thoracic aortic dissection. Dissection extends from the aortic root through the diaphragmatic hiatus. The inferior margin of the dissection is not visualized due to slice selection. 2. No evidence of pulmonary embolus. Critical Value/emergent results were called by telephone at the time of interpretation on 01/08/2021 at 5:09 pm to provider [facility] , who verbally acknowledged these results. Electronically Signed   By: Randa Ngo M.D.   On: 01/08/2021 17:32   DG Chest Port 1 View  Result Date: 01/08/2021 CLINICAL DATA:  STEMI EXAM: PORTABLE CHEST 1 VIEW COMPARISON:  Chest x-ray dated April 21, 2009 FINDINGS: Cardiac and mediastinal contours are within normal limits for AP technique. Mild bilateral heterogeneous opacities. No large pleural effusion or pneumothorax. IMPRESSION: Mild bilateral heterogeneous opacities, differential includes pulmonary edema versus atelectasis. Electronically Signed   By: Yetta Glassman M.D.   On: 01/08/2021 15:03    Procedures .Critical Care Performed by: Regan Lemming, MD Authorized by: Regan Lemming, MD   Critical care provider statement:    Critical care time (minutes):  90   Critical care was necessary to treat or prevent imminent or life-threatening deterioration of the following conditions:  Circulatory failure and cardiac failure   Critical care was  time spent personally by me on the  following activities:  Blood draw for specimens, development of treatment plan with patient or surrogate, discussions with consultants, evaluation of patient's response to treatment, examination of patient, obtaining history from patient or surrogate, ordering and performing treatments and interventions, ordering and review of laboratory studies, ordering and review of radiographic studies, pulse oximetry and re-evaluation of patient's condition   Medications Ordered in ED Medications  0.9 %  sodium chloride infusion (has no administration in time range)  0.9 %  sodium chloride infusion (has no administration in time range)  sodium chloride flush (NS) 0.9 % injection 3 mL ( Intravenous Automatically Held 01/16/21 2200)  dexmedetomidine (PRECEDEX) 400 MCG/100ML (4 mcg/mL) infusion (has no administration in time range)  EPINEPHrine (ADRENALIN) 5 mg in NS 250 mL (0.02 mg/mL) premix infusion (has no administration in time range)  milrinone (PRIMACOR) 20 MG/100 ML (0.2 mg/mL) infusion (has no administration in time range)  nitroGLYCERIN 50 mg in dextrose 5 % 250 mL (0.2 mg/mL) infusion (has no administration in time range)  norepinephrine (LEVOPHED) 4mg  in 252mL premix infusion (has no administration in time range)  potassium chloride injection 80 mEq (has no administration in time range)  heparin 30,000 units/NS 1000 mL solution for CELLSAVER (has no administration in time range)  heparin sodium (porcine) 5,000 Units, papaverine 60 mg in electrolyte-A (PLASMALYTE-A PH 7.4) 1,000 mL irrigation (has no administration in time range)  tranexamic acid (CYKLOKAPRON) pump prime solution 191 mg (has no administration in time range)  tranexamic acid (CYKLOKAPRON) 2,500 mg in sodium chloride 0.9 % 250 mL (10 mg/mL) infusion (has no administration in time range)  ceFAZolin (ANCEF) IVPB 2g/100 mL premix (has no administration in time range)  [facility] Blood Cardioplegia vial (lidocaine/magnesium/mannitol  0.26g-4g-6.4g) (has no administration in time range)  fentaNYL (SUBLIMAZE) injection 50 mcg ( Intravenous MAR Hold 01/08/21 1818)  heparin sodium (porcine) 5,000 Units, papaverine 60 mg in electrolyte-A (PLASMALYTE-A PH 7.4) 1,000 mL irrigation (1,000 mLs Irrigation Given 01/08/21 1910)  hemostatic agents (no charge) Optime (1 application Topical Given 01/08/21 1937)  0.9 % irrigation (POUR BTL) (5,000 mLs Irrigation Given 01/08/21 1750)  lactated ringers bolus 1,000 mL (0 mLs Intravenous Stopped 01/08/21 1612)  heparin injection 4,000 Units (4,000 Units Intravenous Given 01/08/21 1449)  iohexol (OMNIPAQUE) 350 MG/ML injection 75 mL (75 mLs Intravenous Contrast Given 01/08/21 1722)  insulin regular, human (MYXREDLIN) 100 units/ 100 mL infusion (0 Units/hr Intravenous Stopped 01/08/21 1942)  phenylephrine (NEO-SYNEPHRINE) 20mg /NS 229mL premix infusion (0 mcg/min Intravenous Stopped 01/08/21 1951)  tranexamic acid (CYKLOKAPRON) bolus via infusion - over 30 minutes 1,429.5 mg (1,429.5 mg Intravenous Given 01/08/21 1902)  vancomycin (VANCOREADY) IVPB 1500 mg/300 mL (1,500 mg Intravenous Given 01/08/21 1845)  ceFAZolin (ANCEF) IVPB 2g/100 mL premix (2 g Intravenous Given 01/08/21 3244)    ED Course  I have reviewed the triage vital signs and the nursing notes.  Pertinent labs & imaging results that were available during my care of the patient were reviewed by me and considered in my medical decision making (see chart for details).  Clinical Course as of 01/08/21 2125  Fri Jan 08, 2021  1425 96/50 right arm, left arm 96/49 [EH]  1435 Dr. Kellie Simmering requested I call code stemi and place orders for STEMI.  [EH]    Clinical Course User Index [EH] Ollen Gross   MDM Rules/Calculators/A&P HEAR Score: 2  50 year old female presenting with acute onset crushing substernal and left-sided chest pain radiating to her left neck and shoulder.  Symptoms were onset around  1 PM.  The patient endorses diaphoresis.  She endorses nausea.  She denies any vomiting or shortness of breath.  In triage, EKG concerning for possible STEMI with ST segment changes in the anterior leads in V1 and V2 and T wave flattening in the inferior leads, ST depression in leads II, III, aVF. BP hypotensive but symmetric in both arms, radial pulses 2+ bilaterally. Code STEMI was initiated.    On arrival, the patient was hypotensive BP 93/47 with a MAP of 61 and she was afebrile, mildly bradycardic.  She was acutely diaphoretic and in distress.  Blood pressures were symmetric in both arms with 2+ radial pulses bilaterally.  She denied radiation of the pain to her back stating that the pain radiated up to her left neck and shoulder.  She continues to endorse crushing substernal and left-sided chest pain.  The patient was initially bolused heparin on arrival as part of CODE STEMI protocol and is status post 325 mg of aspirin with EMS and 500cc IVF bolus.  After discussion with cardiology, the patient's EKG findings were found to be likely ischemic but not consistent with STEMI.   Bedside POCUS [place] was performed with very limited windows but which revealed no pericardial effusion or tamponade, no wall motion abnormalities, no clear evidence of right heart strain (no septal bowing, RV appeared normal, however IVC was dilated). CTA was ordered to rule out PE vs other acute cardiac or pulmonary abnormality such as dissection.  Initial troponin resulted normal. The patient initially responded to an IVF bolus with subsequent improvement in her blood pressures to 102/56. She remained bradycardic. She was transported to the CT scanner with a plan to follow-up CT imaging at signout. Signout given to Dr. Roslynn Amble at 1600.  Update: CTA resulted in a Type A aortic dissection. CT surgery was consulted for emergent operative repair.  Final Clinical Impression(s) / ED Diagnoses Final diagnoses:  Dissection of ascending  aorta  Dissecting aneurysm of thoracic aorta, [place] type A    Rx / DC Orders ED Discharge Orders     None        Regan Lemming, MD 01/08/21 2047        Regan Lemming, MD 01/08/21 2127    Regan Lemming, MD 01/09/21 [phone]

## 2021-01-08 NOTE — H&P (Signed)
[id number]       ,[place] [id number]             [phone]        Javia H Tener [place] Medical Record #[id number] Date of Birth: 31-Dec-1970  Referring: No ref. provider found Primary Care: Robyne Peers, MD Primary Cardiologist:Kenneth Wells Guiles, MD  Chief Complaint:   No chief complaint on file.   History of Present Illness:     50 year old female presents to the emergency department with acute onset chest and left neck pain.  This occurred earlier today at 1 PM.  She was originally ruled in for non-STEMI given ST depressions in inferior leads.  She was then taken to the CT scanner where type a dissection was identified.  CTS was consulted to assist with management.     Past Medical History:  Diagnosis Date   Allergy    Anemia    had ekg/stress test/echo done in March at [facility] d/t symptoms of anemia-cardiac r/o per pt-records requested   Arthritis    Headache(784.0)    History of blood transfusion 05/2012   [facility]   Hyperlipidemia    Hypertension    meds d/c about 1 year ago after starting exercise program with b/p controlled-pcp aware   Menorrhagia 03/01/2012   Seizures ([place])    as an infant with high fevers   Sickle cell trait ([place])     Past Surgical History:  Procedure Laterality Date   CESAREAN SECTION     VAGINAL HYSTERECTOMY N/A 07/12/2012   Procedure: HYSTERECTOMY VAGINAL;  Surgeon: Ena Dawley, MD;  Location: [facility];  Service: Gynecology;  Laterality: N/A;   WISDOM TOOTH EXTRACTION        Social History   Tobacco Use  Smoking Status Never  Smokeless Tobacco Never    Social History   Substance and Sexual Activity  Alcohol Use Yes   Comment: RARE     No Known Allergies    Current Facility-Administered Medications  Medication Dose Route Frequency Provider Last Rate Last Admin   0.9 %  sodium chloride infusion   Intravenous Continuous Wyn Quaker W, PA-C       0.9 %   sodium chloride infusion  250 mL Intravenous Continuous Regan Lemming, MD       ceFAZolin (ANCEF) IVPB 2g/100 mL premix  2 g Intravenous To OR Yaneliz Radebaugh O, MD       ceFAZolin (ANCEF) IVPB 2g/100 mL premix  2 g Intravenous To OR Dennard Vezina O, MD       dexmedetomidine (PRECEDEX) 400 MCG/100ML (4 mcg/mL) infusion  0.1-0.7 mcg/kg/hr Intravenous To OR Lynard Postlewait O, MD       EPINEPHrine (ADRENALIN) 5 mg in NS 250 mL (0.02 mg/mL) premix infusion  0-10 mcg/min Intravenous To OR Dylin Breeden O, MD       fentaNYL (SUBLIMAZE) injection 50 mcg  50 mcg Intravenous Once Lucrezia Starch, MD       heparin 30,000 units/NS 1000 mL solution for CELLSAVER   Other To OR Hiliana Eilts O, MD       heparin sodium (porcine) 5,000 Units, papaverine 60 mg in electrolyte-A (PLASMALYTE-A PH 7.4) 1,000 mL irrigation   Irrigation To OR Cary Lothrop O, MD       insulin regular, human (MYXREDLIN) 100 units/ 100 mL infusion   Intravenous To OR Garyn Arlotta, Lucile Crater, MD       [facility] Blood Cardioplegia  vial (lidocaine/magnesium/mannitol 0.26g-4g-6.4g)   Intracoronary To OR Kodie Pick O, MD       milrinone (PRIMACOR) 20 MG/100 ML (0.2 mg/mL) infusion  0.3 mcg/kg/min Intravenous To OR Jenavive Lamboy O, MD       nitroGLYCERIN 50 mg in dextrose 5 % 250 mL (0.2 mg/mL) infusion  2-200 mcg/min Intravenous To OR Reuben Knoblock O, MD       norepinephrine (LEVOPHED) 4mg  in 275mL premix infusion  0-40 mcg/min Intravenous To OR Janyth Riera O, MD       phenylephrine (NEO-SYNEPHRINE) 20mg /NS 291mL premix infusion  30-200 mcg/min Intravenous To OR Gracious Renken O, MD       potassium chloride injection 80 mEq  80 mEq Other To OR Numan Zylstra O, MD       sodium chloride flush (NS) 0.9 % injection 3 mL  3 mL Intravenous Q12H Reino Bellis B, NP       tranexamic acid (CYKLOKAPRON) 2,500 mg in sodium chloride 0.9 % 250 mL (10 mg/mL) infusion  1.5 mg/kg/hr Intravenous To  OR Jimmi Sidener O, MD       tranexamic acid (CYKLOKAPRON) bolus via infusion - over 30 minutes 1,429.5 mg  15 mg/kg Intravenous To OR Lennell Shanks O, MD       tranexamic acid (CYKLOKAPRON) pump prime solution 191 mg  2 mg/kg Intracatheter To OR Rebecca Motta O, MD       vancomycin (VANCOREADY) IVPB 1500 mg/300 mL  1,500 mg Intravenous To OR Iysha Mishkin, Lucile Crater, MD       Current Outpatient Medications  Medication Sig Dispense Refill   amLODipine (NORVASC) 5 MG tablet Take 5 mg by mouth daily.     psyllium (METAMUCIL) 58.6 % powder Take 1 packet by mouth daily.      (Not in a hospital admission)   Family History  Problem Relation Age of Onset   Diabetes Maternal Grandmother    Hypertension Maternal Grandmother    Colon cancer Neg Hx    Esophageal cancer Neg Hx    Rectal cancer Neg Hx    Stomach cancer Neg Hx      Review of Systems:   Review of Systems  Cardiovascular:  Positive for chest pain.     Physical Exam: BP (!) 110/57   Pulse (!) 36   Temp 97.8 F (36.6 C)   Resp 16   Ht 5\' 10"  (1.778 m)   Wt 95.3 kg   LMP 05/31/2012   SpO2 100%   BMI 30.13 kg/m  Physical Exam Constitutional:      Appearance: She is not ill-appearing or toxic-appearing.  HENT:     Head: Normocephalic and atraumatic.  Cardiovascular:     Rate and Rhythm: Bradycardia present.     Pulses: Normal pulses.  Pulmonary:     Effort: Pulmonary effort is normal.  Abdominal:     General: Abdomen is flat. There is no distension.     Palpations: Abdomen is soft.  Musculoskeletal:        General: Normal range of motion.     Cervical back: Rigidity present.  Skin:    General: Skin is warm and dry.  Neurological:     General: No focal deficit present.     Mental Status: She is alert and oriented to person, place, and time.       I have independently reviewed the above radiologic studies and discussed with the patient   Recent Lab Findings: Lab Results  Component Value  Date  WBC 8.4 01/08/2021   HGB 13.3 01/08/2021   HCT 39.0 01/08/2021   PLT 255 01/08/2021   GLUCOSE 95 01/08/2021   CHOL 253 (H) 01/08/2021   TRIG 48 01/08/2021   HDL 77 01/08/2021   LDLCALC 166 (H) 01/08/2021   ALT 14 01/08/2021   AST 18 01/08/2021   NA 140 01/08/2021   K 4.0 01/08/2021   CL 105 01/08/2021   CREATININE 1.50 (H) 01/08/2021   BUN 12 01/08/2021   CO2 26 01/08/2021   TSH 1.336 02/29/2012   INR 1.1 01/08/2021   HGBA1C 5.5 01/08/2021      Assessment / Plan:   50 year old female with acute type a dissection.  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  We will plan for type a dissection repair, possible bypass of the right coronary artery.     I  spent 30 minutes counseling the patient face to face.   Lajuana Matte 01/08/2021 5:45 PM

## 2021-01-08 NOTE — ED Provider Notes (Signed)
Sign out note   Physical Exam  BP (!) 98/53   Pulse (!) 57   Temp 97.8 F (36.6 C)   Resp 15   Ht 5\' 10"  (1.778 m)   Wt 95.3 kg   LMP 05/31/2012   SpO2 100%   BMI 30.13 kg/m   Physical Exam Vitals and nursing note reviewed.  HENT:     Head: Normocephalic and atraumatic.     Nose: Nose normal.  Eyes:     Pupils: Pupils are equal, round, and reactive to light.  Cardiovascular:     Rate and Rhythm: Bradycardia present.  Pulmonary:     Effort: Pulmonary effort is normal. No respiratory distress.  Abdominal:     Palpations: Abdomen is soft.  Musculoskeletal:        General: No deformity or signs of injury.  Skin:    General: Skin is warm.  Neurological:     General: No focal deficit present.     Mental Status: She is alert.  Psychiatric:        Mood and Affect: Mood normal.    ED Course/Procedures   Clinical Course as of 01/08/21 1731  Fri Jan 08, 2021  1425 96/50 right arm, left arm 96/49 [EH]  1435 Dr. Kellie Simmering requested I call code stemi and place orders for STEMI.  [EH]    Clinical Course User Index [EH] Lorin Glass, PA-C    .Critical Care Performed by: Lucrezia Starch, MD Authorized by: Lucrezia Starch, MD   Critical care provider statement:    Critical care time (minutes):  38   Critical care time was exclusive of:  Separately billable procedures and treating other patients   Critical care was necessary to treat or prevent imminent or life-threatening deterioration of the following conditions:  Cardiac failure and circulatory failure   Critical care was time spent personally by me on the following activities:  Development of treatment plan with patient or surrogate, discussions with consultants, discussions with primary provider, examination of patient, ordering and performing treatments and interventions, pulse oximetry, re-evaluation of patient's condition, review of old charts, ordering and review of laboratory studies and ordering and  review of radiographic studies   I assumed direction of critical care for this patient from another provider in my specialty: yes     Care discussed with: admitting provider    MDM   50 year old lady presented to ER with concern for chest pain.  Initial EKG concerning for possible STEMI.  STEMI alert was initiated.  Patient received bolus of heparin, interventional cardiology evaluated case and felt not to be STEMI, plan to order CTA chest to rule out pulmonary embolism and dissection.  CT was concerning for type a dissection.  Discussed with Dr. Kipp Brood with CT surgery.  He will take patient to the OR this evening.  Ordered type and screen, fentanyl for pain control.  Currently patient's blood pressure is [id number] systolic and HR 84X. She is having some pain but does not appear in distress. I disucssed with ER pharmacist - as heparin bolus given over two hours ago, does not see indication for any reversal. I also have discussed with the cardiology team and they were aware of this critical finding. Will continue to monitor closely while in ER awaiting OR.  Have updated patient and family at bedside.       Lucrezia Starch, MD 01/08/21 [phone]

## 2021-01-08 NOTE — ED Provider Notes (Signed)
Emergency Medicine Provider Triage Evaluation Note  Catherine Mcdonald , a 50 y.o. female  was evaluated in triage.  Pt complains of Chest pain.  She reports that at about 1pm she was brushing her hair and had sudden onset of severe substernal chest pain that radiates into her left sided neck.  She is diaphoretic and feels short of breath. No history of similar. She denies any recent sickness.  She didn't take her amlodipine. She does report that she has had occasional leg swelling but that is not abnormal. She was given 4 baby asa by EMS.   Review of Systems  Positive: Chest pain, hypotension, diaphoresis.   Negative: Fevers, vomiting  Physical Exam  BP (!) 96/49   Pulse (!) 57   Temp 97.8 F (36.6 C)   Resp 16   Ht 5\' 10"  (1.778 m)   Wt 95.3 kg   LMP 05/31/2012   SpO2 100%   BMI 30.13 kg/m  Gen:   Awake, appears uncomfortable, diaphoretic.  Resp:  Normal effort  MSK:   Moves extremities without difficulty 2+ radial pulses bilaterally.  Other:  Pedal pulses intact.  She appears unwell  Medical Decision Making  Medically screening exam initiated at 2: 20 PM.  Appropriate orders placed.  JACQUILINE ZURCHER was informed that the remainder of the evaluation will be completed by another provider, this initial triage assessment does not replace that evaluation, and the importance of remaining in the ED until their evaluation is complete.  EKG concerning.  I spoke with Dr. Armandina Gemma.  He requested repeat EKG which was obtained.  Given her hypotension, her chest pain with radiation into left sided neck, her diaphoresis and clinical presentation he requested I activate code STEMI.  Nitro held given hypotension.  BP similar in each arm making dissection less likely.  She was given ASA by EMS and 528ml of fluids.  Patient taken to trauma B.    Lorin Glass, PA-C 01/08/21 1449    Regan Lemming, MD 01/08/21 Pauline Aus

## 2021-01-08 NOTE — Anesthesia Procedure Notes (Signed)
Procedure Name: Intubation Date/Time: 01/08/2021 6:13 PM Performed by: Moshe Salisbury, CRNA Pre-anesthesia Checklist: Patient identified, Emergency Drugs available, Suction available and Patient being monitored Patient Re-evaluated:Patient Re-evaluated prior to induction Oxygen Delivery Method: Circle system utilized Preoxygenation: Pre-oxygenation with 100% oxygen Induction Type: IV induction, Rapid sequence and Cricoid Pressure applied Laryngoscope Size: Mac and 4 Grade View: Grade I Tube type: Oral Tube size: 7.0 mm Number of attempts: 1 Airway Equipment and Method: Stylet and Oral airway Placement Confirmation: ETT inserted through vocal cords under direct vision, positive ETCO2 and breath sounds checked- equal and bilateral Secured at: 22 cm Tube secured with: Tape Dental Injury: Teeth and Oropharynx as per pre-operative assessment

## 2021-01-08 NOTE — Anesthesia Procedure Notes (Signed)
Central Venous Catheter Insertion Performed by: Albertha Ghee, MD, anesthesiologist Start/End10/28/2022 6:20 PM, 01/08/2021 6:30 PM Patient location: Pre-op. Emergency situation Preanesthetic checklist: patient identified, IV checked, site marked, risks and benefits discussed, surgical consent, monitors and equipment checked, pre-op evaluation, timeout performed and anesthesia consent Patient sedated Hand hygiene performed  and maximum sterile barriers used  Catheter size: 9 Fr MAC introducer Procedure performed using ultrasound guided technique. Ultrasound Notes:anatomy identified, needle tip was noted to be adjacent to the nerve/plexus identified, no ultrasound evidence of intravascular and/or intraneural injection and image(s) printed for medical record Attempts: 1 Following insertion, line sutured and dressing applied. Post procedure assessment: blood return through all ports, free fluid flow and no air  Patient tolerated the procedure well with no immediate complications.

## 2021-01-09 ENCOUNTER — Encounter (HOSPITAL_COMMUNITY): Payer: Self-pay | Admitting: Thoracic Surgery (Cardiothoracic Vascular Surgery)

## 2021-01-09 ENCOUNTER — Inpatient Hospital Stay (HOSPITAL_COMMUNITY): Payer: BC Managed Care – PPO

## 2021-01-09 DIAGNOSIS — D62 Acute posthemorrhagic anemia: Secondary | ICD-10-CM

## 2021-01-09 DIAGNOSIS — J9601 Acute respiratory failure with hypoxia: Secondary | ICD-10-CM

## 2021-01-09 DIAGNOSIS — I2510 Atherosclerotic heart disease of native coronary artery without angina pectoris: Secondary | ICD-10-CM | POA: Diagnosis not present

## 2021-01-09 DIAGNOSIS — I71019 Dissection of thoracic aorta, unspecified: Secondary | ICD-10-CM | POA: Diagnosis not present

## 2021-01-09 DIAGNOSIS — I214 Non-ST elevation (NSTEMI) myocardial infarction: Secondary | ICD-10-CM | POA: Diagnosis not present

## 2021-01-09 DIAGNOSIS — R579 Shock, unspecified: Secondary | ICD-10-CM

## 2021-01-09 DIAGNOSIS — I7101 Dissection of ascending aorta: Secondary | ICD-10-CM | POA: Diagnosis not present

## 2021-01-09 LAB — BPAM CRYOPRECIPITATE
Blood Product Expiration Date: 202210290445
ISSUE DATE / TIME: 202210282258
Unit Type and Rh: 6200

## 2021-01-09 LAB — CBC
HCT: 33.2 % — ABNORMAL LOW (ref 36.0–46.0)
HCT: 23.6 % — ABNORMAL LOW (ref 36.0–46.0)
HCT: 23 % — ABNORMAL LOW (ref 36.0–46.0)
Hemoglobin: 10.8 g/dL — ABNORMAL LOW (ref 12.0–15.0)
Hemoglobin: 8 g/dL — ABNORMAL LOW (ref 12.0–15.0)
Hemoglobin: 7.3 g/dL — ABNORMAL LOW (ref 12.0–15.0)
MCH: 26.2 pg (ref 26.0–34.0)
MCH: 27.9 pg (ref 26.0–34.0)
MCH: 26.6 pg (ref 26.0–34.0)
MCHC: 32.5 g/dL (ref 30.0–36.0)
MCHC: 33.9 g/dL (ref 30.0–36.0)
MCHC: 31.7 g/dL (ref 30.0–36.0)
MCV: 80.4 fL (ref 80.0–100.0)
MCV: 82.2 fL (ref 80.0–100.0)
MCV: 83.9 fL (ref 80.0–100.0)
Platelets: 133 10*3/uL — ABNORMAL LOW (ref 150–400)
Platelets: 137 10*3/uL — ABNORMAL LOW (ref 150–400)
Platelets: 128 10*3/uL — ABNORMAL LOW (ref 150–400)
RBC: 4.13 MIL/uL (ref 3.87–5.11)
RBC: 2.87 MIL/uL — ABNORMAL LOW (ref 3.87–5.11)
RBC: 2.74 MIL/uL — ABNORMAL LOW (ref 3.87–5.11)
RDW: 15.4 % (ref 11.5–15.5)
RDW: 15.3 % (ref 11.5–15.5)
RDW: 15.8 % — ABNORMAL HIGH (ref 11.5–15.5)
WBC: 15.8 10*3/uL — ABNORMAL HIGH (ref 4.0–10.5)
WBC: 13.3 10*3/uL — ABNORMAL HIGH (ref 4.0–10.5)
WBC: 12.1 10*3/uL — ABNORMAL HIGH (ref 4.0–10.5)
nRBC: 0 % (ref 0.0–0.2)
nRBC: 0 % (ref 0.0–0.2)
nRBC: 0 % (ref 0.0–0.2)

## 2021-01-09 LAB — PREPARE CRYOPRECIPITATE: Unit division: 0

## 2021-01-09 LAB — POCT I-STAT 7, (LYTES, BLD GAS, ICA,H+H)
Acid-Base Excess: 0 mmol/L (ref 0.0–2.0)
Acid-base deficit: 3 mmol/L — ABNORMAL HIGH (ref 0.0–2.0)
Acid-base deficit: 1 mmol/L (ref 0.0–2.0)
Acid-base deficit: 1 mmol/L (ref 0.0–2.0)
Bicarbonate: 21.5 mmol/L (ref 20.0–28.0)
Bicarbonate: 25.9 mmol/L (ref 20.0–28.0)
Bicarbonate: 24.5 mmol/L (ref 20.0–28.0)
Bicarbonate: 24.7 mmol/L (ref 20.0–28.0)
Calcium, Ion: 1.04 mmol/L — ABNORMAL LOW (ref 1.15–1.40)
Calcium, Ion: 1.06 mmol/L — ABNORMAL LOW (ref 1.15–1.40)
Calcium, Ion: 1.12 mmol/L — ABNORMAL LOW (ref 1.15–1.40)
Calcium, Ion: 1.1 mmol/L — ABNORMAL LOW (ref 1.15–1.40)
HCT: 32 % — ABNORMAL LOW (ref 36.0–46.0)
HCT: 18 % — ABNORMAL LOW (ref 36.0–46.0)
HCT: 30 % — ABNORMAL LOW (ref 36.0–46.0)
HCT: 21 % — ABNORMAL LOW (ref 36.0–46.0)
Hemoglobin: 10.9 g/dL — ABNORMAL LOW (ref 12.0–15.0)
Hemoglobin: 6.1 g/dL — CL (ref 12.0–15.0)
Hemoglobin: 10.2 g/dL — ABNORMAL LOW (ref 12.0–15.0)
Hemoglobin: 7.1 g/dL — ABNORMAL LOW (ref 12.0–15.0)
O2 Saturation: 95 %
O2 Saturation: 100 %
O2 Saturation: 98 %
O2 Saturation: 92 %
Patient temperature: 36.2
Patient temperature: 36.6
Patient temperature: 37
Patient temperature: 37.2
Potassium: 4 mmol/L (ref 3.5–5.1)
Potassium: 4.2 mmol/L (ref 3.5–5.1)
Potassium: 3.9 mmol/L (ref 3.5–5.1)
Potassium: 3.8 mmol/L (ref 3.5–5.1)
Sodium: 140 mmol/L (ref 135–145)
Sodium: 143 mmol/L (ref 135–145)
Sodium: 144 mmol/L (ref 135–145)
Sodium: 145 mmol/L (ref 135–145)
TCO2: 23 mmol/L (ref 22–32)
TCO2: 27 mmol/L (ref 22–32)
TCO2: 26 mmol/L (ref 22–32)
TCO2: 26 mmol/L (ref 22–32)
pCO2 arterial: 33.8 mmHg (ref 32.0–48.0)
pCO2 arterial: 47.1 mmHg (ref 32.0–48.0)
pCO2 arterial: 45.9 mmHg (ref 32.0–48.0)
pCO2 arterial: 45.7 mmHg (ref 32.0–48.0)
pH, Arterial: 7.408 (ref 7.350–7.450)
pH, Arterial: 7.347 — ABNORMAL LOW (ref 7.350–7.450)
pH, Arterial: 7.336 — ABNORMAL LOW (ref 7.350–7.450)
pH, Arterial: 7.341 — ABNORMAL LOW (ref 7.350–7.450)
pO2, Arterial: 70 mmHg — ABNORMAL LOW (ref 83.0–108.0)
pO2, Arterial: 191 mmHg — ABNORMAL HIGH (ref 83.0–108.0)
pO2, Arterial: 106 mmHg (ref 83.0–108.0)
pO2, Arterial: 70 mmHg — ABNORMAL LOW (ref 83.0–108.0)

## 2021-01-09 LAB — BASIC METABOLIC PANEL
Anion gap: 6 (ref 5–15)
Anion gap: 6 (ref 5–15)
Anion gap: 8 (ref 5–15)
BUN: 14 mg/dL (ref 6–20)
BUN: 15 mg/dL (ref 6–20)
BUN: 20 mg/dL (ref 6–20)
CO2: 23 mmol/L (ref 22–32)
CO2: 25 mmol/L (ref 22–32)
CO2: 24 mmol/L (ref 22–32)
Calcium: 6.9 mg/dL — ABNORMAL LOW (ref 8.9–10.3)
Calcium: 7.3 mg/dL — ABNORMAL LOW (ref 8.9–10.3)
Calcium: 7.6 mg/dL — ABNORMAL LOW (ref 8.9–10.3)
Chloride: 109 mmol/L (ref 98–111)
Chloride: 112 mmol/L — ABNORMAL HIGH (ref 98–111)
Chloride: 112 mmol/L — ABNORMAL HIGH (ref 98–111)
Creatinine, Ser: 1.54 mg/dL — ABNORMAL HIGH (ref 0.44–1.00)
Creatinine, Ser: 1.67 mg/dL — ABNORMAL HIGH (ref 0.44–1.00)
Creatinine, Ser: 1.9 mg/dL — ABNORMAL HIGH (ref 0.44–1.00)
GFR, Estimated: 41 mL/min — ABNORMAL LOW (ref >60)
GFR, Estimated: 37 mL/min — ABNORMAL LOW (ref >60)
GFR, Estimated: 32 mL/min — ABNORMAL LOW (ref >60)
Glucose, Bld: 126 mg/dL — ABNORMAL HIGH (ref 70–99)
Glucose, Bld: 110 mg/dL — ABNORMAL HIGH (ref 70–99)
Glucose, Bld: 100 mg/dL — ABNORMAL HIGH (ref 70–99)
Potassium: 4 mmol/L (ref 3.5–5.1)
Potassium: 4.3 mmol/L (ref 3.5–5.1)
Potassium: 3.8 mmol/L (ref 3.5–5.1)
Sodium: 138 mmol/L (ref 135–145)
Sodium: 143 mmol/L (ref 135–145)
Sodium: 144 mmol/L (ref 135–145)

## 2021-01-09 LAB — APTT
aPTT: 36 seconds (ref 24–36)
aPTT: 38 seconds — ABNORMAL HIGH (ref 24–36)

## 2021-01-09 LAB — MAGNESIUM
Magnesium: 2.4 mg/dL (ref 1.7–2.4)
Magnesium: 3 mg/dL — ABNORMAL HIGH (ref 1.7–2.4)

## 2021-01-09 LAB — GLUCOSE, CAPILLARY
Glucose-Capillary: 102 mg/dL — ABNORMAL HIGH (ref 70–99)
Glucose-Capillary: 111 mg/dL — ABNORMAL HIGH (ref 70–99)
Glucose-Capillary: 121 mg/dL — ABNORMAL HIGH (ref 70–99)
Glucose-Capillary: 122 mg/dL — ABNORMAL HIGH (ref 70–99)
Glucose-Capillary: 126 mg/dL — ABNORMAL HIGH (ref 70–99)
Glucose-Capillary: 128 mg/dL — ABNORMAL HIGH (ref 70–99)
Glucose-Capillary: 138 mg/dL — ABNORMAL HIGH (ref 70–99)
Glucose-Capillary: 72 mg/dL (ref 70–99)
Glucose-Capillary: 99 mg/dL (ref 70–99)

## 2021-01-09 LAB — BPAM PLATELET PHERESIS
Blood Product Expiration Date: 202210302359
ISSUE DATE / TIME: 202210282247
Unit Type and Rh: 6200

## 2021-01-09 LAB — PREPARE RBC (CROSSMATCH)

## 2021-01-09 LAB — PREPARE FRESH FROZEN PLASMA

## 2021-01-09 LAB — PROTIME-INR
INR: 1.7 — ABNORMAL HIGH (ref 0.8–1.2)
INR: 1 (ref 0.8–1.2)
Prothrombin Time: 19.8 seconds — ABNORMAL HIGH (ref 11.4–15.2)
Prothrombin Time: 13 seconds (ref 11.4–15.2)

## 2021-01-09 LAB — BPAM FFP
Blood Product Expiration Date: 202210302359
ISSUE DATE / TIME: 202210282245
Unit Type and Rh: 6200

## 2021-01-09 LAB — FIBRINOGEN
Fibrinogen: 167 mg/dL — ABNORMAL LOW (ref 210–475)
Fibrinogen: 241 mg/dL (ref 210–475)

## 2021-01-09 LAB — PREPARE PLATELET PHERESIS: Unit division: 0

## 2021-01-09 LAB — MRSA NEXT GEN BY PCR, NASAL: MRSA by PCR Next Gen: NOT DETECTED

## 2021-01-09 MED ORDER — SODIUM CHLORIDE 0.9 % IV SOLN: INTRAVENOUS | Status: DC

## 2021-01-09 MED ORDER — ASPIRIN EC 325 MG PO TBEC
325.0000 mg | DELAYED_RELEASE_TABLET | Freq: Every day | ORAL | Status: DC
  Administered 2021-01-10 – 2021-01-17 (×8): 325 mg via ORAL
  Filled 2021-01-09 (×8): qty 1

## 2021-01-09 MED ORDER — TRAMADOL HCL 50 MG PO TABS
50.0000 mg | ORAL_TABLET | ORAL | Status: DC | PRN
  Administered 2021-01-09 – 2021-01-11 (×4): 50 mg via ORAL
  Administered 2021-01-11 – 2021-01-13 (×4): 100 mg via ORAL
  Administered 2021-01-13: 50 mg via ORAL
  Administered 2021-01-14 – 2021-01-17 (×9): 100 mg via ORAL
  Filled 2021-01-09: qty 2
  Filled 2021-01-09: qty 1
  Filled 2021-01-09: qty 2
  Filled 2021-01-09: qty 1
  Filled 2021-01-09 (×8): qty 2
  Filled 2021-01-09: qty 1
  Filled 2021-01-09: qty 2
  Filled 2021-01-09: qty 1
  Filled 2021-01-09 (×2): qty 2
  Filled 2021-01-09: qty 1

## 2021-01-09 MED ORDER — OXYCODONE HCL 5 MG PO TABS
5.0000 mg | ORAL_TABLET | ORAL | Status: DC | PRN
  Administered 2021-01-09 – 2021-01-12 (×2): 5 mg via ORAL
  Administered 2021-01-14 – 2021-01-17 (×5): 10 mg via ORAL
  Filled 2021-01-09 (×2): qty 2
  Filled 2021-01-09 (×2): qty 1
  Filled 2021-01-09 (×3): qty 2

## 2021-01-09 MED ORDER — SODIUM CHLORIDE 0.9% IV SOLUTION: Freq: Once | INTRAVENOUS | Status: DC

## 2021-01-09 MED ORDER — CHLORHEXIDINE GLUCONATE 0.12 % MT SOLN
15.0000 mL | OROMUCOSAL | Status: AC
  Administered 2021-01-09: 15 mL via OROMUCOSAL

## 2021-01-09 MED ORDER — SODIUM CHLORIDE 0.9% IV SOLUTION: Freq: Once | INTRAVENOUS | Status: AC

## 2021-01-09 MED ORDER — METOPROLOL TARTRATE 25 MG/10 ML ORAL SUSPENSION: 12.5000 mg | Freq: Two times a day (BID) | ORAL | Status: DC

## 2021-01-09 MED ORDER — ACETAMINOPHEN 160 MG/5ML PO SOLN: 650.0000 mg | Freq: Once | ORAL | Status: AC

## 2021-01-09 MED ORDER — ASPIRIN 81 MG PO CHEW: 324.0000 mg | CHEWABLE_TABLET | Freq: Every day | ORAL | Status: DC

## 2021-01-09 MED ORDER — VASOPRESSIN 20 UNIT/ML IV SOLN
INTRAVENOUS | Status: AC
  Filled 2021-01-09: qty 1

## 2021-01-09 MED ORDER — ORAL CARE MOUTH RINSE
15.0000 mL | OROMUCOSAL | Status: DC
  Administered 2021-01-09 (×4): 15 mL via OROMUCOSAL

## 2021-01-09 MED ORDER — LACTATED RINGERS IV SOLN: INTRAVENOUS | Status: DC

## 2021-01-09 MED ORDER — CEFAZOLIN SODIUM-DEXTROSE 2-4 GM/100ML-% IV SOLN
2.0000 g | Freq: Three times a day (TID) | INTRAVENOUS | Status: AC
  Administered 2021-01-09 – 2021-01-10 (×6): 2 g via INTRAVENOUS
  Filled 2021-01-09 (×6): qty 100

## 2021-01-09 MED ORDER — PANTOPRAZOLE SODIUM 40 MG PO TBEC: 40.0000 mg | DELAYED_RELEASE_TABLET | Freq: Every day | ORAL | Status: DC

## 2021-01-09 MED ORDER — METOPROLOL TARTRATE 5 MG/5ML IV SOLN
2.5000 mg | INTRAVENOUS | Status: DC | PRN
Start: 2021-01-09 — End: 2021-01-14

## 2021-01-09 MED ORDER — NOREPINEPHRINE 4 MG/250ML-% IV SOLN: 0.0000 ug/min | INTRAVENOUS | Status: DC

## 2021-01-09 MED ORDER — CHLORHEXIDINE GLUCONATE 0.12% ORAL RINSE (MEDLINE KIT)
15.0000 mL | Freq: Two times a day (BID) | OROMUCOSAL | Status: DC
  Administered 2021-01-09 – 2021-01-10 (×3): 15 mL via OROMUCOSAL

## 2021-01-09 MED ORDER — ALBUMIN HUMAN 5 % IV SOLN
250.0000 mL | INTRAVENOUS | Status: AC | PRN
  Administered 2021-01-09: 12.5 g via INTRAVENOUS
  Filled 2021-01-09 (×3): qty 250

## 2021-01-09 MED ORDER — NITROGLYCERIN IN D5W 200-5 MCG/ML-% IV SOLN: 0.0000 ug/min | INTRAVENOUS | Status: DC

## 2021-01-09 MED ORDER — SODIUM CHLORIDE 0.9% FLUSH
3.0000 mL | Freq: Two times a day (BID) | INTRAVENOUS | Status: DC
  Administered 2021-01-10 – 2021-01-17 (×8): 3 mL via INTRAVENOUS

## 2021-01-09 MED ORDER — NICARDIPINE HCL IN NACL 20-0.86 MG/200ML-% IV SOLN
3.0000 mg/h | INTRAVENOUS | Status: DC
Start: 2021-01-09 — End: 2021-01-10
  Filled 2021-01-09: qty 200

## 2021-01-09 MED ORDER — SODIUM CHLORIDE 0.9% FLUSH: 10.0000 mL | INTRAVENOUS | Status: DC | PRN

## 2021-01-09 MED ORDER — MORPHINE SULFATE (PF) 2 MG/ML IV SOLN
1.0000 mg | INTRAVENOUS | Status: DC | PRN
  Administered 2021-01-09 (×3): 2 mg via INTRAVENOUS
  Filled 2021-01-09: qty 2
  Filled 2021-01-09: qty 1

## 2021-01-09 MED ORDER — POTASSIUM CHLORIDE 10 MEQ/50ML IV SOLN: 10.0000 meq | INTRAVENOUS | Status: AC

## 2021-01-09 MED ORDER — METOPROLOL TARTRATE 12.5 MG HALF TABLET
12.5000 mg | ORAL_TABLET | Freq: Two times a day (BID) | ORAL | Status: DC
  Administered 2021-01-10 – 2021-01-13 (×5): 12.5 mg via ORAL
  Filled 2021-01-09 (×6): qty 1

## 2021-01-09 MED ORDER — COAGULATION FACTOR VIIA RECOMB 1 MG IV SOLR
90.0000 ug/kg | INTRAVENOUS | Status: AC
  Administered 2021-01-09: 9000 ug via INTRAVENOUS
  Filled 2021-01-09: qty 4

## 2021-01-09 MED ORDER — BISACODYL 10 MG RE SUPP: 10.0000 mg | Freq: Every day | RECTAL | Status: DC

## 2021-01-09 MED ORDER — LACTATED RINGERS IV SOLN: 500.0000 mL | Freq: Once | INTRAVENOUS | Status: DC | PRN

## 2021-01-09 MED ORDER — DOCUSATE SODIUM 100 MG PO CAPS
200.0000 mg | ORAL_CAPSULE | Freq: Every day | ORAL | Status: DC
  Administered 2021-01-10 – 2021-01-17 (×6): 200 mg via ORAL
  Filled 2021-01-09 (×8): qty 2

## 2021-01-09 MED ORDER — ACETAMINOPHEN 650 MG RE SUPP
650.0000 mg | Freq: Once | RECTAL | Status: AC
  Administered 2021-01-09: 650 mg via RECTAL
  Filled 2021-01-09: qty 1

## 2021-01-09 MED ORDER — PHENYLEPHRINE HCL-NACL 20-0.9 MG/250ML-% IV SOLN
0.0000 ug/min | INTRAVENOUS | Status: DC
Start: 2021-01-09 — End: 2021-01-10

## 2021-01-09 MED ORDER — MIDAZOLAM HCL 2 MG/2ML IJ SOLN
2.0000 mg | INTRAMUSCULAR | Status: DC | PRN
  Administered 2021-01-09: 2 mg via INTRAVENOUS
  Filled 2021-01-09: qty 2

## 2021-01-09 MED ORDER — VANCOMYCIN HCL IN DEXTROSE 1-5 GM/200ML-% IV SOLN
1000.0000 mg | Freq: Once | INTRAVENOUS | Status: AC
  Administered 2021-01-09: 1000 mg via INTRAVENOUS
  Filled 2021-01-09: qty 200

## 2021-01-09 MED ORDER — FAMOTIDINE IN NACL 20-0.9 MG/50ML-% IV SOLN
20.0000 mg | Freq: Two times a day (BID) | INTRAVENOUS | Status: AC
  Administered 2021-01-09 (×2): 20 mg via INTRAVENOUS
  Filled 2021-01-09 (×2): qty 50

## 2021-01-09 MED ORDER — SODIUM CHLORIDE 0.45 % IV SOLN: INTRAVENOUS | Status: DC | PRN

## 2021-01-09 MED ORDER — SODIUM CHLORIDE 0.9% FLUSH
10.0000 mL | Freq: Two times a day (BID) | INTRAVENOUS | Status: DC
Start: 2021-01-09 — End: 2021-01-17
  Administered 2021-01-09 (×2): 10 mL
  Administered 2021-01-10: 20 mL
  Administered 2021-01-11 – 2021-01-17 (×9): 10 mL

## 2021-01-09 MED ORDER — INSULIN REGULAR(HUMAN) IN NACL 100-0.9 UT/100ML-% IV SOLN
INTRAVENOUS | Status: DC
  Administered 2021-01-09: 0.6 [IU]/h via INTRAVENOUS

## 2021-01-09 MED ORDER — DEXMEDETOMIDINE HCL IN NACL 400 MCG/100ML IV SOLN
0.0000 ug/kg/h | INTRAVENOUS | Status: DC
  Administered 2021-01-09: 0.499 ug/kg/h via INTRAVENOUS
  Filled 2021-01-09 (×2): qty 100

## 2021-01-09 MED ORDER — CHLORHEXIDINE GLUCONATE CLOTH 2 % EX PADS
6.0000 | MEDICATED_PAD | Freq: Every day | CUTANEOUS | Status: DC
  Administered 2021-01-09 – 2021-01-17 (×9): 6 via TOPICAL

## 2021-01-09 MED ORDER — BISACODYL 5 MG PO TBEC
10.0000 mg | DELAYED_RELEASE_TABLET | Freq: Every day | ORAL | Status: DC
  Administered 2021-01-10 – 2021-01-17 (×6): 10 mg via ORAL
  Filled 2021-01-09 (×8): qty 2

## 2021-01-09 MED ORDER — ACETAMINOPHEN 500 MG PO TABS
1000.0000 mg | ORAL_TABLET | Freq: Four times a day (QID) | ORAL | Status: AC
  Administered 2021-01-09 – 2021-01-14 (×19): 1000 mg via ORAL
  Filled 2021-01-09 (×19): qty 2

## 2021-01-09 MED ORDER — SODIUM CHLORIDE 0.9 % IV SOLN: 250.0000 mL | INTRAVENOUS | Status: DC

## 2021-01-09 MED ORDER — DEXTROSE 50 % IV SOLN: 0.0000 mL | INTRAVENOUS | Status: DC | PRN

## 2021-01-09 MED ORDER — SODIUM CHLORIDE 0.9% FLUSH: 3.0000 mL | INTRAVENOUS | Status: DC | PRN

## 2021-01-09 MED ORDER — PHENYLEPHRINE HCL-NACL 20-0.9 MG/250ML-% IV SOLN
0.0000 ug/min | INTRAVENOUS | Status: DC
  Administered 2021-01-09: 30 ug/min via INTRAVENOUS
  Filled 2021-01-09: qty 250

## 2021-01-09 MED ORDER — INSULIN ASPART 100 UNIT/ML IJ SOLN
0.0000 [IU] | INTRAMUSCULAR | Status: DC
  Administered 2021-01-09 – 2021-01-10 (×2): 2 [IU] via SUBCUTANEOUS

## 2021-01-09 MED ORDER — ACETAMINOPHEN 160 MG/5ML PO SOLN
1000.0000 mg | Freq: Four times a day (QID) | ORAL | Status: AC
Start: 2021-01-10 — End: 2021-01-14

## 2021-01-09 MED ORDER — MAGNESIUM SULFATE 4 GM/100ML IV SOLN
4.0000 g | Freq: Once | INTRAVENOUS | Status: AC
  Administered 2021-01-09: 4 g via INTRAVENOUS
  Filled 2021-01-09: qty 100

## 2021-01-09 MED ORDER — ONDANSETRON HCL 4 MG/2ML IJ SOLN
4.0000 mg | Freq: Four times a day (QID) | INTRAMUSCULAR | Status: DC | PRN
  Administered 2021-01-09: 4 mg via INTRAVENOUS
  Filled 2021-01-09: qty 2

## 2021-01-09 NOTE — Hospital Course (Addendum)
History of Present Illness:  50 year old female with history of HTN and Dyslipidemia presents to the emergency department with acute onset chest and left neck pain.  This occurred earlier today at 1 PM.  She was originally ruled in for STEMI given ST depressions in inferior leads.  She was then taken to the CT scanner where type a dissection was identified.  CTS was consulted to assist with management.  Hospital Course:  She was evaluated by Dr. Kipp Brood who felt emergent surgery would be indicated.  She was taken to the operating room and underwent Right axillary cannulation with a 8 mm graft, antegrade cerebral perfusion, aortic valve re-suspension, and replacement of Ascending aorta and arch.  She tolerated the procedure without difficulty and was taken to the SICU in stable condition.  She was weaned and extubated on POD #1.  Her arterial line was removed.  Her creatinine was elevated felt to be related to ischemic injury from dissection.  She had good urinary output and her creatinine improved over time without further intervention. It is quickly normalizing and the most recent value on 11/04 is 0.96.  She was weaned off Neo-synephrine as tolerated.    On postop day 4 she was transferred to the stepdown unit.  Her diet was advanced.  She continued on aggressive routine cardiac rehab modalities.  She continued on aggressive pulmonary hygiene modalities.  Her expected acute blood loss anemia has stabilized.  She did receive products.  Oxygen has been weaned and she maintains good saturations on room air.  Incisions are noted to be healing well without evidence of infection.  Her blood pressure has been well controlled. She was felt surgically stable for transfer from the ICU to 4E for further convalescence on 11/02. Chest tube output has drastically decreased so all were removed on 11/03. Her BP was well controlled with low dose Lopressor, which was changed to Toprol XL. She had left hand swelling and LUE  swelling so duplex US was checked to rule out DVT. There was no DVT, only superficial vein thrombosis left cephalic vein. Per patient request, 3-in-1 ordered. As discussed with Dr. Kipp Brood, patient surgically stable for discharge today.

## 2021-01-09 NOTE — Procedures (Signed)
Extubation Procedure Note  Patient Details:   Name: Catherine Mcdonald DOB: 1970/05/12 MRN: [id number]   Airway Documentation:    Vent end date: 01/09/21 Vent end time: 1138   Evaluation  O2 sats: stable throughout Complications: No apparent complications Patient did tolerate procedure well. Bilateral Breath Sounds: Clear   Yes  Audible cuffleak was heard before extubation and no signs of stridor heard. NIF -21 and VC of 800 ml was achieved prior extubation.  Pt is talking and is on 4L [place]. RT will continue to monitor. Felecia Jan 01/09/2021, 11:39 AM

## 2021-01-09 NOTE — Consult Note (Signed)
NAME:  Catherine Mcdonald, MRN:  [id number], DOB:  April 02, 1970, LOS: 1 ADMISSION DATE:  01/08/2021, CONSULTATION DATE:  01/09/21 REFERRING MD:  Kipp Brood, CHIEF COMPLAINT:  post-dissection repair  History of Present Illness:  Catherine Mcdonald is Catherine Mcdonald who presented to the hospital on 10/29 with acute onset left-sided CP radiating to her left neck and shoulder.  She had associated diaphoresis and nausea, but no SOB. With EMS she was hypotensive 90/60s. Upon arrival to the ED she was diagnosed with STEMI, but on further assessment was felt to possibly have an alternative cause of her symptoms. CTA demonstrated ascending and descending aortic dissection. She was taken to the OR emergently for dissection repair with platinum graft. Post-bypass she had mild coagulopathy treated with FFP, cryo, and factor VII concentrate. She was extubated this morning per post-op protocol.  She has never smoked. She was on amlodipine PTA, but per chart review had not been taking this. Her mother and maternal grandmother both had hypertension.   Pertinent  Medical History  HTN  Significant Hospital Events: Including procedures, antibiotic start and stop dates in addition to other pertinent events   10/28 admitted, OR for dissection repair with graft 10/29 extubated  Interim History / Subjective:    Objective   Blood pressure 104/67, pulse 90, temperature 97.9 F (36.6 C), temperature source Oral, resp. rate 16, height 5\' 10"  (1.778 m), weight 95.3 kg, last menstrual period 05/31/2012, SpO2 100 %. PAP: (9-48)/(-7-41) 34/24 CO:  [2.9 L/min-3.2 L/min] 3.2 L/min CI:  [1.4 L/min/m2-1.5 L/min/m2] 1.5 L/min/m2  Vent Mode: CPAP;PSV FiO2 (%):  [40 %-60 %] 40 % Set Rate:  [4 bmp-12 bmp] 4 bmp Vt Set:  [540 mL] 540 mL PEEP:  [5 cmH20] 5 cmH20 Pressure Support:  [10 cmH20] 10 cmH20 Plateau Pressure:  [16 cmH20-21 cmH20] 21 cmH20   Intake/Output Summary (Last 24 hours) at 01/09/2021 1521 Last data filed at  01/09/2021 1404 Gross per 24 hour  Intake 7180.39 ml  Output 7870 ml  Net -689.61 ml   Filed Weights   01/08/21 1430  Weight: 95.3 kg    Examination: General: [name] lying in bed in NAD HENT: [place]/AT, eyes anicteric. Hoarse voice. Lungs: breathing comfortably on [place], CTAB Cardiovascular: S1S2, RRR. wound vac in place over sternum. Bloody output from mediastinal and chest tubes Abdomen: soft, NT, ND Extremities: no LE edema, symmetric DP & PT pulses.  Neuro: awake, but falls back asleep when not being stimulated. Follows commands in all extremities GU: foley draining yellow urine. Derm: warm, dry   BUN 15 Cr 1.67 H/H 8/23.6 A1c 5.5  Resolved Hospital Problem list     Assessment & Plan:  Aortic dissection, type Catherine, s/p platinum graft repair Post-op hypotension -weaned off pressors after extubation -Goal SBP <130, not requiring antihypertensives currently. Nicardipine PRN, NTG PRN. -start metoprolol BID  -Can add oral antihypertensive as tolerated. Would hold on ACE/ARB for now given AKI. -long-term needs to establish with PCP and importance of antihypertensive compliance was stressed -acetaminophen, tramadol, oxycodone, fentanyl PRN for pain  Acute respiratory failure with hypoxia -extubated per protocol this morning -wean supplemental O2 as able to maintain SpO2 >90% -pulmonary hygiene -OOB mobility when able  Acute anemia due to operative blood loss -transfuse for Hb<7 or hemodynamically significant bleeding -monitor  Hyperglycemia, prediabetes -transition off insulin infusion -SSI PRN -goal BG 140-180 -needs OP follow up with PCP for pre-diabetes  HLD -may need lipids repeated fasting   Best Practice (right  click and "Reselect all SmartList Selections" daily)   Diet/type: clear liquids, progress as tolerated DVT prophylaxis: SCD GI prophylaxis: N/Catherine Lines: Central line and Arterial Line Foley:  Yes, and it is still needed Code Status:  full  code Last date of multidisciplinary goals of care discussion [husband updated at bedside 10/29 ]  Labs   CBC: Recent Labs  Lab 01/08/21 1427 01/08/21 1638 01/08/21 2150 01/08/21 2201 01/09/21 0108 01/09/21 0358 01/09/21 0500 01/09/21 1119 01/09/21 1243  WBC 8.4  --   --   --  15.8*  --  13.3*  --   --   NEUTROABS 6.0  --   --   --   --   --   --   --   --   HGB 13.6   < > 8.8*  9.2*  9.5*   < > 10.8* 6.1* 8.0* 10.2* 7.1*  HCT 42.8   < > 27.1*  27.0*  28.0*   < > 33.2* 18.0* 23.6* 30.0* 21.0*  MCV 81.1  --   --   --  80.4  --  82.2  --   --   PLT 255  --  156  --  133*  --  137*  --   --    < > = values in this interval not displayed.    Basic Metabolic Panel: Recent Labs  Lab 01/08/21 1427 01/08/21 1638 01/08/21 2150 01/08/21 2201 01/08/21 2216 01/08/21 2248 01/08/21 2324 01/09/21 0103 01/09/21 0134 01/09/21 0358 01/09/21 0500 01/09/21 1119 01/09/21 1243  NA 139   < > 139  138   < > 138   < > 136  137   < > 138 143 143 144 145  K 3.5   < > 3.9  3.3*   < > 4.3   < > 4.3  4.3   < > 4.0 4.2 4.3 3.9 3.8  CL 105   < > 103  --  101  --  99  --  109  --  112*  --   --   CO2 26  --   --   --   --   --   --   --  23  --  25  --   --   GLUCOSE 100*   < > 236*  --  208*  --  174*  --  126*  --  110*  --   --   BUN 9   < > 11  --  13  --  13  --  14  --  15  --   --   CREATININE 1.35*   < > 1.10*  --  1.20*  --  1.20*  --  1.54*  --  1.67*  --   --   CALCIUM 8.6*  --   --   --   --   --   --   --  6.9*  --  7.3*  --   --   MG  --   --   --   --   --   --   --   --  2.4  --   --   --   --    < > = values in this interval not displayed.   GFR: Estimated Creatinine Clearance: 49.8 mL/min (Catherine) (by C-G formula based on SCr of 1.67 mg/dL (H)). Recent Labs  Lab 01/08/21 1427 01/09/21 0108 01/09/21 0500  WBC 8.4 15.8*  13.3*    Liver Function Tests: Recent Labs  Lab 01/08/21 1427  AST 18  ALT 14  ALKPHOS 60  BILITOT 0.9  PROT 5.8*  ALBUMIN 3.5   No results  for input(s): LIPASE, AMYLASE in the last 168 hours. No results for input(s): AMMONIA in the last 168 hours.  ABG    Component Value Date/Time   PHART 7.341 (L) 01/09/2021 1243   PCO2ART 45.7 01/09/2021 1243   PO2ART 70 (L) 01/09/2021 1243   HCO3 24.7 01/09/2021 1243   TCO2 26 01/09/2021 1243   ACIDBASEDEF 1.0 01/09/2021 1243   O2SAT 92.0 01/09/2021 1243     Coagulation Profile: Recent Labs  Lab 01/08/21 1416 01/09/21 0108 01/09/21 0500  INR 1.1 1.7* 1.0    Cardiac Enzymes: No results for input(s): CKTOTAL, CKMB, CKMBINDEX, TROPONINI in the last 168 hours.  HbA1C: Hgb A1c MFr Bld  Date/Time Value Ref Range Status  01/08/2021 02:35 PM 5.5 4.8 - 5.6 % Final    Comment:    (NOTE) Pre diabetes:          5.7%-6.4%  Diabetes:              >6.4%  Glycemic control for   <7.0% adults with diabetes     CBG: Recent Labs  Lab 01/09/21 0140 01/09/21 0204 01/09/21 0355 01/09/21 0911 01/09/21 1116  GLUCAP 138* 128* 126* 111* 121*     Review of Systems: (bold if positive, otherwise negative)  General: fevers, chills, sweats HENT: rhinorrhea, congestion, sore throat Eyes: blurry vision, double vision Cardio: chest pain, palpitations, edema Pulm: wheezing, cough, sputum production, hemoptysis, SOB Abd: heartburn, nausea, vomiting, diarrhea, bloody stools, abdominal pain GU: hematuria, dysuria Derm: rashes, wounds Heme/Lymph: adenopathy, bruising Neuro: syncope, headache, vertigo, numbness, weakness   Past Medical History:  She,  has Catherine past medical history of Allergy, Anemia, Arthritis, Headache(784.0), History of blood transfusion (05/2012), Hyperlipidemia, Hypertension, Menorrhagia (03/01/2012), Seizures ([place]), and Sickle cell trait ([place]).   Surgical History:   Past Surgical History:  Procedure Laterality Date   CESAREAN SECTION     VAGINAL HYSTERECTOMY N/Catherine 07/12/2012   Procedure: HYSTERECTOMY VAGINAL;  Surgeon: Ena Dawley, MD;  Location: [facility];  Service:  Gynecology;  Laterality: N/Catherine;   WISDOM TOOTH EXTRACTION       Social History:   reports that she has never smoked. She has never used smokeless tobacco. She reports current alcohol use. She reports that she does not use drugs.   Family History:  Her family history includes Diabetes in her maternal grandmother; Hypertension in her maternal grandmother. There is no history of Colon cancer, Esophageal cancer, Rectal cancer, or Stomach cancer.   Allergies No Known Allergies   Home Medications  Prior to Admission medications   Medication Sig Start Date End Date Taking? Authorizing Provider  amLODipine (NORVASC) 5 MG tablet Take 5 mg by mouth daily.   Yes [provider]  psyllium (METAMUCIL) 58.6 % powder Take 1 packet by mouth daily.   Yes [provider]     Critical care time: 48 min.   Julian Hy, DO 01/09/21 5:23 PM [facility]

## 2021-01-09 NOTE — Transfer of Care (Signed)
Immediate Anesthesia Transfer of Care Note  Patient: Catherine Mcdonald  Procedure(s) Performed: Repair of Acute Ascending Thoracic Aortic Dissection Using Hemashield Platinum Graft Size 28MM TRANSESOPHAGEAL ECHOCARDIOGRAM (TEE)  Patient Location: PACU  Anesthesia Type:General  Level of Consciousness: Patient remains intubated per anesthesia plan  Airway & Oxygen Therapy: Patient remains intubated per anesthesia plan and Patient placed on Ventilator (see vital sign flow sheet for setting)  Post-op Assessment: Report given to RN and Post -op Vital signs reviewed and stable  Post vital signs: Reviewed and stable  Last Vitals:  Vitals Value Taken Time  BP    Temp 36.2 C 01/09/21 0101  Pulse 87 01/09/21 0101  Resp 16 01/09/21 0101  SpO2 100 % 01/09/21 0101  Vitals shown include unvalidated device data.  Last Pain:  Vitals:   01/08/21 1412  PainSc: 6          Complications: No notable events documented.

## 2021-01-09 NOTE — Anesthesia Postprocedure Evaluation (Signed)
Anesthesia Post Note  Patient: Catherine Mcdonald  Procedure(s) Performed: Repair of Acute Ascending Thoracic Aortic Dissection Using Hemashield Platinum Graft Size 28MM TRANSESOPHAGEAL ECHOCARDIOGRAM (TEE)     Patient location during evaluation: SICU Anesthesia Type: General Level of consciousness: sedated Pain management: pain level controlled Vital Signs Assessment: post-procedure vital signs reviewed and stable Respiratory status: patient remains intubated per anesthesia plan Cardiovascular status: stable Postop Assessment: no apparent nausea or vomiting Anesthetic complications: no   No notable events documented.  Last Vitals:  Vitals:   01/09/21 0255 01/09/21 0300  BP:    Pulse: 80 79  Resp: 12 12  Temp: 36.7 C 36.6 C  SpO2: 96% 96%    Last Pain:  Vitals:   01/09/21 0218  TempSrc: Core  PainSc:                  Catherine Mcdonald

## 2021-01-09 NOTE — Progress Notes (Signed)
[address]       [place] [address]             [phone]                 1 Day Post-Op Procedure(s) (LRB): Repair of Acute Ascending Thoracic Aortic Dissection Using Hemashield Platinum Graft Size 28MM (N/A) TRANSESOPHAGEAL ECHOCARDIOGRAM (TEE) (N/A)   Events: Bleeding slowed after product and novo7 _______________________________________________________________ Vitals: BP (!) 62/39   Pulse 79   Temp 99.1 F (37.3 C)   Resp 12   Ht 5\' 10"  (1.778 m)   Wt 95.3 kg   LMP 05/31/2012   SpO2 100%   BMI 30.13 kg/m  Filed Weights   01/08/21 1430  Weight: 95.3 kg     - Neuro: arousable  - Cardiovascular: sinus  Drips: neo 25.   PAP: (9-48)/(-7-41) 30/22 CO:  [2.9 L/min-3.2 L/min] 3.2 L/min CI:  [1.4 L/min/m2-1.5 L/min/m2] 1.5 L/min/m2  - Pulm:  Vent Mode: SIMV;PSV FiO2 (%):  [50 %-60 %] 50 % Set Rate:  [12 bmp] 12 bmp Vt Set:  [540 mL] 540 mL PEEP:  [5 cmH20] 5 cmH20 Pressure Support:  [10 cmH20] 10 cmH20 Plateau Pressure:  [16 cmH20-21 cmH20] 21 cmH20  ABG    Component Value Date/Time   PHART 7.347 (L) 01/09/2021 0358   PCO2ART 47.1 01/09/2021 0358   PO2ART 191 (H) 01/09/2021 0358   HCO3 25.9 01/09/2021 0358   TCO2 27 01/09/2021 0358   ACIDBASEDEF 3.0 (H) 01/09/2021 0103   O2SAT 100.0 01/09/2021 0358    - Abd: soft, ND - Extremity: warm  .Intake/Output      10/28 0701 10/29 0700 10/29 0701 10/30 0700   I.V. (mL/kg) 2355.2 (24.7)    Blood 2787.8    IV Piggyback 680.6    Total Intake(mL/kg) 5823.6 (61.1)    Urine (mL/kg/hr) 1550 60 (0.3)   Drains  0   Blood 3650    Chest Tube 1680 100   Total Output 6880 160   Net -1056.4 -160           _______________________________________________________________ Labs: CBC Latest Ref Rng & Units 01/09/2021 01/09/2021 01/09/2021  WBC 4.0 - 10.5 K/uL 13.3(H) - 15.8(H)  Hemoglobin 12.0 - 15.0 g/dL 8.0(L) 6.1(LL) 10.8(L)  Hematocrit 36.0 - 46.0 % 23.6(L) 18.0(L) 33.2(L)   Platelets 150 - 400 K/uL 137(L) - 133(L)   CMP Latest Ref Rng & Units 01/09/2021 01/09/2021 01/09/2021  Glucose 70 - 99 mg/dL 110(H) - 126(H)  BUN 6 - 20 mg/dL 15 - 14  Creatinine 0.44 - 1.00 mg/dL 1.67(H) - 1.54(H)  Sodium 135 - 145 mmol/L 143 143 138  Potassium 3.5 - 5.1 mmol/L 4.3 4.2 4.0  Chloride 98 - 111 mmol/L 112(H) - 109  CO2 22 - 32 mmol/L 25 - 23  Calcium 8.9 - 10.3 mg/dL 7.3(L) - 6.9(L)  Total Protein 6.5 - 8.1 g/dL - - -  Total Bilirubin 0.3 - 1.2 mg/dL - - -  Alkaline Phos 38 - 126 U/L - - -  AST 15 - 41 U/L - - -  ALT 0 - 44 U/L - - -    CXR: PV congestion  _______________________________________________________________  Assessment and Plan: POD 0 s/p type A dissection repair  Neuro: wean sedation as tolerated CV: on neo.  Hypertensive when stimulated.  Cardene on board as need.  Goal SBP 100-130s Pulm: wean to extubated Renal: good uop.  Creat stable.  Will continue to  watch GI: npo for now Heme: stable after product ID: afebrile Endo: SSI  Dispo: continue ICU care   Lajuana Matte 01/09/2021 8:58 AM

## 2021-01-09 NOTE — Op Note (Signed)
[address]       [place] [address]             [phone]                                           01/09/2021 Patient:  Catherine Mcdonald Pre-Op Dx: Type A dissection   Post-op Dx: Same Procedure: Right axillary artery cannulation with 8 mm graft Antegrade cerebral perfusion. Aortic valve resuspension Type A aortic dissection repair with 28 mm graft Type A dissection repair with hemiarch.  Surgeon and Role:      * Tanishi Nault, Lucile Crater, MD - Primary  Anesthesia  general EBL: 1000 ml Blood Administration: 1 unit of FFP, cryo, platelets  Xclamp Time: 60 min   Drains: 70 F blake drain: R, mediastinal x2  Wires: Ventricular Counts: correct   Indications: 50 year old female with acute type a dissection.  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  We will plan for type a dissection repair, possible bypass of the right coronary artery.  Findings: Type A dissection.  The flap traverse through the arch.  There was a hematoma that extended down towards the right coronary.  There was no involvement of the right or left coronary ostium.  Operative Technique: All invasive lines were placed in pre-op holding.  After the risks, benefits and alternatives were thoroughly discussed, the patient was brought to the operative theatre.  Anesthesia was induced, and the patient was prepped and draped in normal sterile fashion.  An appropriate surgical pause was performed, and pre-operative antibiotics were dosed accordingly.  We began with an incision over the right deltopectoral groove.  This was carried down with a combination of Bovie cautery and blunt dissection until we reached the right axillary artery.  Patient was then systemically heparinized, and the axillary artery was encircled and clamped.  An arteriotomy was created and an 8 mm graft was sewn to the axillary artery in an end-to-side fashion.  This would serve as our  arterial cannulation site.  Next a sternotomy was created in standard fashion.  The pericardium was then divided in the midline and fashion into a cradle.  The right atrial appendage was then used as our venous cannulation site.  Cardiopulmonary bypass was then initiated and we began to cool the patient to 28 degrees.  The right superior pulmonary vein was used for LV vent.  We then began to mobilize the ascending aorta up to the innominate artery.  It was encircled with a umbilical tape.  Once we reached 28 degrees, the innominate artery was clamped and we began our circulatory arrest with antegrade cerebral perfusion.  The ascending aorta was then divided and there was a clear flap extending down towards the root and through the aortic arch.  An antegrade dose of cardioplegia was given down both ostium.  The aortic valve was inspected.  The aortic valve leaflets were normal.  The root did not appear aneurysmal..  We began by mobilizing the dissection flap up to the innominate artery, with the plan of performing a hemiarch replacement.  A felt strips were used to sandwich and reinforce the dissection flap that was sewn with a 4-0 Prolene.  We then sized the distal aorta to a 28 mm graft.  The graft was anastomosed to the distal aorta and upon  completion the graft was thoroughly de-aired and we removed our innominate artery clamp and resumed full cardiopulmonary bypass.  At this point we began to rewarm the patient.  We then focused our attention to the proximal aorta.  The dissection flap was trimmed down and the aortic valve was then resuspended with 4 -0 pledgeted suture.  Another felt strip sandwich was used to reinforce the dissection flap.  On the proximal aorta was then sewn to the graft with 4-0 Prolene.  A DLP cannula was then placed in the graft to assist with de-airing.  A reanimation dose of cardioplegia was then given, and after thoroughly de-airing the heart the cross-clamp was removed.  We checked  our valve function, and for air using the TEE.  Once we were satisfying, we separated from cardiopulmonary bypass without event.    The heparin was reversed with protamine, and hemostasis was obtained.  Chest tubes and wires were placed, and the sternum was re-approximated with with sternal wires.  The soft tissue and skin were re-approximated wth absorbable suture.    The patient tolerated the procedure without any immediate complications, and was transferred to the ICU in guarded condition.  Catherine Mcdonald Catherine Mcdonald

## 2021-01-10 ENCOUNTER — Inpatient Hospital Stay (HOSPITAL_COMMUNITY): Payer: BC Managed Care – PPO

## 2021-01-10 ENCOUNTER — Other Ambulatory Visit: Payer: Self-pay

## 2021-01-10 DIAGNOSIS — R579 Shock, unspecified: Secondary | ICD-10-CM | POA: Diagnosis not present

## 2021-01-10 DIAGNOSIS — J9601 Acute respiratory failure with hypoxia: Secondary | ICD-10-CM | POA: Diagnosis not present

## 2021-01-10 DIAGNOSIS — I7101 Dissection of ascending aorta: Secondary | ICD-10-CM | POA: Diagnosis not present

## 2021-01-10 LAB — CBC
HCT: 26.2 % — ABNORMAL LOW (ref 36.0–46.0)
HCT: 27.9 % — ABNORMAL LOW (ref 36.0–46.0)
Hemoglobin: 8.7 g/dL — ABNORMAL LOW (ref 12.0–15.0)
Hemoglobin: 8.9 g/dL — ABNORMAL LOW (ref 12.0–15.0)
MCH: 27.3 pg (ref 26.0–34.0)
MCH: 26.8 pg (ref 26.0–34.0)
MCHC: 33.2 g/dL (ref 30.0–36.0)
MCHC: 31.9 g/dL (ref 30.0–36.0)
MCV: 82.1 fL (ref 80.0–100.0)
MCV: 84 fL (ref 80.0–100.0)
Platelets: 108 10*3/uL — ABNORMAL LOW (ref 150–400)
Platelets: 127 10*3/uL — ABNORMAL LOW (ref 150–400)
RBC: 3.19 MIL/uL — ABNORMAL LOW (ref 3.87–5.11)
RBC: 3.32 MIL/uL — ABNORMAL LOW (ref 3.87–5.11)
RDW: 17.2 % — ABNORMAL HIGH (ref 11.5–15.5)
RDW: 17.5 % — ABNORMAL HIGH (ref 11.5–15.5)
WBC: 15.3 10*3/uL — ABNORMAL HIGH (ref 4.0–10.5)
WBC: 18 10*3/uL — ABNORMAL HIGH (ref 4.0–10.5)
nRBC: 0 % (ref 0.0–0.2)
nRBC: 0 % (ref 0.0–0.2)

## 2021-01-10 LAB — MAGNESIUM: Magnesium: 2.9 mg/dL — ABNORMAL HIGH (ref 1.7–2.4)

## 2021-01-10 LAB — BASIC METABOLIC PANEL
Anion gap: 6 (ref 5–15)
Anion gap: 6 (ref 5–15)
BUN: 22 mg/dL — ABNORMAL HIGH (ref 6–20)
BUN: 23 mg/dL — ABNORMAL HIGH (ref 6–20)
CO2: 25 mmol/L (ref 22–32)
CO2: 26 mmol/L (ref 22–32)
Calcium: 7.6 mg/dL — ABNORMAL LOW (ref 8.9–10.3)
Calcium: 7.8 mg/dL — ABNORMAL LOW (ref 8.9–10.3)
Chloride: 108 mmol/L (ref 98–111)
Chloride: 107 mmol/L (ref 98–111)
Creatinine, Ser: 1.82 mg/dL — ABNORMAL HIGH (ref 0.44–1.00)
Creatinine, Ser: 1.66 mg/dL — ABNORMAL HIGH (ref 0.44–1.00)
GFR, Estimated: 33 mL/min — ABNORMAL LOW (ref >60)
GFR, Estimated: 37 mL/min — ABNORMAL LOW (ref >60)
Glucose, Bld: 114 mg/dL — ABNORMAL HIGH (ref 70–99)
Glucose, Bld: 107 mg/dL — ABNORMAL HIGH (ref 70–99)
Potassium: 3.6 mmol/L (ref 3.5–5.1)
Potassium: 3.7 mmol/L (ref 3.5–5.1)
Sodium: 139 mmol/L (ref 135–145)
Sodium: 139 mmol/L (ref 135–145)

## 2021-01-10 LAB — BPAM CRYOPRECIPITATE
Blood Product Expiration Date: 202210290803
Blood Product Expiration Date: 202210290803
ISSUE DATE / TIME: 202210290221
ISSUE DATE / TIME: 202210290221
Unit Type and Rh: 6200
Unit Type and Rh: 6200

## 2021-01-10 LAB — PREPARE CRYOPRECIPITATE
Unit division: 0
Unit division: 0

## 2021-01-10 LAB — GLUCOSE, CAPILLARY
Glucose-Capillary: 108 mg/dL — ABNORMAL HIGH (ref 70–99)
Glucose-Capillary: 120 mg/dL — ABNORMAL HIGH (ref 70–99)
Glucose-Capillary: 129 mg/dL — ABNORMAL HIGH (ref 70–99)
Glucose-Capillary: 76 mg/dL (ref 70–99)
Glucose-Capillary: 87 mg/dL (ref 70–99)
Glucose-Capillary: 96 mg/dL (ref 70–99)

## 2021-01-10 LAB — PREPARE PLATELET PHERESIS: Unit division: 0

## 2021-01-10 LAB — BPAM PLATELET PHERESIS
Blood Product Expiration Date: 202210302359
ISSUE DATE / TIME: 202210290215
Unit Type and Rh: 5100

## 2021-01-10 MED ORDER — POTASSIUM CHLORIDE 10 MEQ/50ML IV SOLN
10.0000 meq | INTRAVENOUS | Status: AC
Start: 2021-01-10 — End: 2021-01-11
  Administered 2021-01-10 – 2021-01-11 (×3): 10 meq via INTRAVENOUS
  Filled 2021-01-10 (×3): qty 50

## 2021-01-10 MED ORDER — POTASSIUM CHLORIDE 10 MEQ/50ML IV SOLN
10.0000 meq | INTRAVENOUS | Status: AC
  Administered 2021-01-10 (×3): 10 meq via INTRAVENOUS
  Filled 2021-01-10: qty 50

## 2021-01-10 MED ORDER — INSULIN ASPART 100 UNIT/ML IJ SOLN: 0.0000 [IU] | INTRAMUSCULAR | Status: DC

## 2021-01-10 MED ORDER — ENOXAPARIN SODIUM 40 MG/0.4ML IJ SOSY
40.0000 mg | PREFILLED_SYRINGE | Freq: Every day | INTRAMUSCULAR | Status: DC
  Administered 2021-01-10 – 2021-01-16 (×7): 40 mg via SUBCUTANEOUS
  Filled 2021-01-10 (×7): qty 0.4

## 2021-01-10 MED ORDER — ORAL CARE MOUTH RINSE
15.0000 mL | Freq: Two times a day (BID) | OROMUCOSAL | Status: DC
  Administered 2021-01-10 – 2021-01-17 (×12): 15 mL via OROMUCOSAL

## 2021-01-10 NOTE — Significant Event (Signed)
Evening labs collected and sent by RN. Called to lab as results are not in [place]; per lab personnel, he has the sample in the lab and will run it. At 1830, RN called again to lab due to unable to see results. Lab personnel stated will call RN back when she checked on it.

## 2021-01-10 NOTE — Progress Notes (Signed)
NAME:  Catherine Mcdonald, MRN:  [id number], DOB:  08-20-1970, LOS: 2 ADMISSION DATE:  01/08/2021, CONSULTATION DATE:  01/09/21 REFERRING MD:  Kipp Brood, CHIEF COMPLAINT:  post-aortic dissection repair  History of Present Illness:  [name] is a 50 y/o woman who presented to the hospital on 10/29 with acute onset left-sided CP radiating to her left neck and shoulder.  She had associated diaphoresis and nausea, but no SOB. With EMS she was hypotensive 90/60s. Upon arrival to the ED she was diagnosed with STEMI, but on further assessment was felt to possibly have an alternative cause of her symptoms. CTA demonstrated ascending and descending aortic dissection. She was taken to the OR emergently for dissection repair with platinum graft. Post-bypass she had mild coagulopathy treated with FFP, cryo, and factor VII concentrate. She was extubated this morning per post-op protocol.  She has never smoked. She was on amlodipine PTA, but per chart review had not been taking this. Her mother and maternal grandmother both had hypertension.   Pertinent  Medical History  HTN  Significant Hospital Events: Including procedures, antibiotic start and stop dates in addition to other pertinent events   10/28 admitted, OR for dissection repair with graft 10/29 extubated  Interim History / Subjective:  Back on phenylephrine overnight.  2 units PRBCs overnight.  She is feeling overwhelmed but otherwise has no complaints this morning.  Objective   Blood pressure (!) 81/46, pulse (!) 102, temperature 98.2 F (36.8 C), temperature source Oral, resp. rate (!) 21, height 5\' 10"  (1.778 m), weight 103.6 kg, last menstrual period 05/31/2012, SpO2 100 %. PAP: (29-48)/(21-41) 34/24 CO:  [3.2 L/min] 3.2 L/min CI:  [1.5 L/min/m2] 1.5 L/min/m2  Vent Mode: CPAP;PSV FiO2 (%):  [40 %-50 %] 40 % Set Rate:  [4 bmp-12 bmp] 4 bmp Vt Set:  [540 mL] 540 mL PEEP:  [5 cmH20] 5 cmH20 Pressure Support:  [10 cmH20] 10  cmH20 Plateau Pressure:  [21 cmH20] 21 cmH20   Intake/Output Summary (Last 24 hours) at 01/10/2021 0740 Last data filed at 01/10/2021 0600 Gross per 24 hour  Intake 3134.51 ml  Output 2735 ml  Net 399.51 ml    Filed Weights   01/08/21 1430 01/10/21 0700  Weight: 95.3 kg 103.6 kg    Examination: General: middle aged woman sitting up in the chair in NAD HENT: [place]/AT, eyes anicteric Lungs: Breathing comfortably on [place], bibasilar rhales cleared with deeper breaths. <500cc on IS. Cardiovascular: S1S2, RRR sternal dressing intact. Serosanguinous drainage from  Abdomen: soft, NT, ND Extremities: no LE edema, symmetric DP & PT pulses.  Neuro: awake, but falls back asleep when not being stimulated. Follows commands in all extremities GU: foley draining yellow urine. Derm: warm, dry  Chest tube output 740 cc  CXR personally reviewed> bilateral chest tubes, likely atelectasis vs infiltrate RLL BUN 22 Cr 1.82 WBC 15.3 H/H 8.7/26.2 Platelets 108 A1c 5.5  Resolved Hospital Problem list     Assessment & Plan:  Aortic dissection, type A, s/p platinum graft repair Post-op hypotension -weak NE as able to maintain MAP >65 -Goal SBP <130, not requiring antihypertensives, but does get more hypertensive with stimulation. Nicardipine & NTG PRN. -start metoprolol BID when off pressors  -Add oral hypertensives when able.  Hold on ACE/ARB until AKI improved. -long-term needs to establish with PCP and importance of antihypertensive compliance has been stressed -acetaminophen, tramadol, oxycodone, fentanyl PRN for pain  Acute respiratory failure with hypoxia, extubated POD 1 -wean O2 to maintain SpO2 >90% -  IS, pulmonary hygiene, OOB moblity  AKI, improving -con't to monitor -renally dose meds, avoid nephrotoxic meds  Acute anemia due to operative blood loss, status post 2 units PRBCs overnight 10/29 -Transfuse for hemoglobin less than 7 or hemodynamically significant bleeding -Continue  to monitor  Acute thrombocytopenia, likely consumptive postoperatively - Monitor - No indication currently for transfusion.  Hyperglycemia controlled, prediabetes -Sliding scale insulin as needed -goal BG 140-180 -needs OP follow up with PCP for pre-diabetes  HLD -may need lipids repeated when fasting   Best Practice (right click and "Reselect all SmartList Selections" daily)   Diet/type: clear liquids, progress as tolerated DVT prophylaxis: SCD GI prophylaxis: H2B Lines: Central line and Arterial Line Foley:  Yes, and it is still needed Code Status:  full code Last date of multidisciplinary goals of care discussion [husband updated at bedside 10/30]  Labs   CBC: Recent Labs  Lab 01/08/21 1427 01/08/21 1638 01/08/21 2150 01/08/21 2201 01/09/21 0108 01/09/21 0358 01/09/21 0500 01/09/21 1119 01/09/21 1243 01/09/21 1533 01/10/21 0405  WBC 8.4  --   --   --  15.8*  --  13.3*  --   --  12.1* 15.3*  NEUTROABS 6.0  --   --   --   --   --   --   --   --   --   --   HGB 13.6   < > 8.8*  9.2*  9.5*   < > 10.8*   < > 8.0* 10.2* 7.1* 7.3* 8.7*  HCT 42.8   < > 27.1*  27.0*  28.0*   < > 33.2*   < > 23.6* 30.0* 21.0* 23.0* 26.2*  MCV 81.1  --   --   --  80.4  --  82.2  --   --  83.9 82.1  PLT 255  --  156  --  133*  --  137*  --   --  128* 108*   < > = values in this interval not displayed.     Basic Metabolic Panel: Recent Labs  Lab 01/08/21 1427 01/08/21 1638 01/08/21 2324 01/09/21 0103 01/09/21 0134 01/09/21 0358 01/09/21 0500 01/09/21 1119 01/09/21 1243 01/09/21 1533 01/10/21 0405  NA 139   < > 136  137   < > 138   < > 143 144 145 144 139  K 3.5   < > 4.3  4.3   < > 4.0   < > 4.3 3.9 3.8 3.8 3.6  CL 105   < > 99  --  109  --  112*  --   --  112* 108  CO2 26  --   --   --  23  --  25  --   --  24 25  GLUCOSE 100*   < > 174*  --  126*  --  110*  --   --  100* 114*  BUN 9   < > 13  --  14  --  15  --   --  20 22*  CREATININE 1.35*   < > 1.20*  --  1.54*   --  1.67*  --   --  1.90* 1.82*  CALCIUM 8.6*  --   --   --  6.9*  --  7.3*  --   --  7.6* 7.6*  MG  --   --   --   --  2.4  --   --   --   --  3.0*  --    < > = values in this interval not displayed.    GFR: Estimated Creatinine Clearance: 47.6 mL/min (A) (by C-G formula based on SCr of 1.82 mg/dL (H)). Recent Labs  Lab 01/09/21 0108 01/09/21 0500 01/09/21 1533 01/10/21 0405  WBC 15.8* 13.3* 12.1* 15.3*     Liver Function Tests: Recent Labs  Lab 01/08/21 1427  AST 18  ALT 14  ALKPHOS 60  BILITOT 0.9  PROT 5.8*  ALBUMIN 3.5    No results for input(s): LIPASE, AMYLASE in the last 168 hours. No results for input(s): AMMONIA in the last 168 hours.  ABG    Component Value Date/Time   PHART 7.341 (L) 01/09/2021 1243   PCO2ART 45.7 01/09/2021 1243   PO2ART 70 (L) 01/09/2021 1243   HCO3 24.7 01/09/2021 1243   TCO2 26 01/09/2021 1243   ACIDBASEDEF 1.0 01/09/2021 1243   O2SAT 92.0 01/09/2021 1243      Coagulation Profile: Recent Labs  Lab 01/08/21 1416 01/09/21 0108 01/09/21 0500  INR 1.1 1.7* 1.0    This patient is critically ill with multiple organ system failure which requires frequent high complexity decision making, assessment, support, evaluation, and titration of therapies. This was completed through the application of advanced monitoring technologies and extensive interpretation of multiple databases. During this encounter critical care time was devoted to patient care services described in this note for 35 minutes.  Julian Hy, DO 01/10/21 9:00 AM  Pulmonary & Critical Care

## 2021-01-10 NOTE — Progress Notes (Signed)
      [address]       [place] [address]             [phone]                 2 Days Post-Op Procedure(s) (LRB): Repair of Acute Ascending Thoracic Aortic Dissection Using Hemashield Platinum Graft Size 28MM (N/A) TRANSESOPHAGEAL ECHOCARDIOGRAM (TEE) (N/A)   Events: No events _______________________________________________________________ Vitals: BP (!) 101/59 (BP Location: Right Arm)   Pulse 99   Temp 98.2 F (36.8 C) (Oral)   Resp (!) 6   Ht 5\' 10"  (1.778 m)   Wt 103.6 kg   LMP 05/31/2012   SpO2 100%   BMI 32.77 kg/m  Filed Weights   01/08/21 1430 01/10/21 0700  Weight: 95.3 kg 103.6 kg     - Neuro: alert NAD  - Cardiovascular: sinus  Drips: none  PAP: (29-34)/(22-24) 34/24  - Pulm:  EWOB ABG    Component Value Date/Time   PHART 7.341 (L) 01/09/2021 1243   PCO2ART 45.7 01/09/2021 1243   PO2ART 70 (L) 01/09/2021 1243   HCO3 24.7 01/09/2021 1243   TCO2 26 01/09/2021 1243   ACIDBASEDEF 1.0 01/09/2021 1243   O2SAT 92.0 01/09/2021 1243    - Abd: soft, ND - Extremity: warm  .Intake/Output      10/29 0701 10/30 0700 10/30 0701 10/31 0700   P.O. 600    I.V. (mL/kg) 1051.4 (10.1) 16.3 (0.2)   Blood 790    NG/GT 0    IV Piggyback 729.6    Total Intake(mL/kg) 3171 (30.6) 16.3 (0.2)   Urine (mL/kg/hr) 1925 (0.8) 30 (0.1)   Emesis/NG output 100    Drains 0    Blood     Chest Tube 740    Total Output 2765 30   Net +406 -13.7           _______________________________________________________________ Labs: CBC Latest Ref Rng & Units 01/10/2021 01/09/2021 01/09/2021  WBC 4.0 - 10.5 K/uL 15.3(H) 12.1(H) -  Hemoglobin 12.0 - 15.0 g/dL 8.7(L) 7.3(L) 7.1(L)  Hematocrit 36.0 - 46.0 % 26.2(L) 23.0(L) 21.0(L)  Platelets 150 - 400 K/uL 108(L) 128(L) -   CMP Latest Ref Rng & Units 01/10/2021 01/09/2021 01/09/2021  Glucose 70 - 99 mg/dL 114(H) 100(H) -  BUN 6 - 20 mg/dL 22(H) 20 -  Creatinine 0.44 - 1.00 mg/dL 1.82(H) 1.90(H) -   Sodium 135 - 145 mmol/L 139 144 145  Potassium 3.5 - 5.1 mmol/L 3.6 3.8 3.8  Chloride 98 - 111 mmol/L 108 112(H) -  CO2 22 - 32 mmol/L 25 24 -  Calcium 8.9 - 10.3 mg/dL 7.6(L) 7.6(L) -  Total Protein 6.5 - 8.1 g/dL - - -  Total Bilirubin 0.3 - 1.2 mg/dL - - -  Alkaline Phos 38 - 126 U/L - - -  AST 15 - 41 U/L - - -  ALT 0 - 44 U/L - - -    CXR: PV congestion,  _______________________________________________________________  Assessment and Plan: POD 1 s/p type A dissection repair  Neuro: pain controlled CV: stable BP.  Will remove a line.   Goal SBP 100-130s Pulm: aggressive pulm toilet Renal: good uop.  Creat trending down.  Will continue to watch GI: advancing diet Heme: stable after product ID: afebrile Endo: SSI  Dispo: continue ICU care   [place] 01/10/2021 9:09 AM

## 2021-01-10 NOTE — Significant Event (Signed)
EPW has been removed without complications. Tip of epicardial wire is intact; no blood or tissue on it; no bleeding noted after procedure. VS obtained Q15 minutes. Patient resting in bed.

## 2021-01-11 ENCOUNTER — Inpatient Hospital Stay (HOSPITAL_COMMUNITY): Payer: BC Managed Care – PPO

## 2021-01-11 ENCOUNTER — Encounter (HOSPITAL_COMMUNITY): Payer: Self-pay | Admitting: Thoracic Surgery (Cardiothoracic Vascular Surgery)

## 2021-01-11 DIAGNOSIS — I7101 Dissection of ascending aorta: Secondary | ICD-10-CM | POA: Diagnosis not present

## 2021-01-11 DIAGNOSIS — Z9889 Other specified postprocedural states: Secondary | ICD-10-CM | POA: Diagnosis not present

## 2021-01-11 DIAGNOSIS — Z8679 Personal history of other diseases of the circulatory system: Secondary | ICD-10-CM | POA: Diagnosis not present

## 2021-01-11 LAB — CBC
HCT: 25.1 % — ABNORMAL LOW (ref 36.0–46.0)
Hemoglobin: 8.1 g/dL — ABNORMAL LOW (ref 12.0–15.0)
MCH: 27.5 pg (ref 26.0–34.0)
MCHC: 32.3 g/dL (ref 30.0–36.0)
MCV: 85.1 fL (ref 80.0–100.0)
Platelets: 120 10*3/uL — ABNORMAL LOW (ref 150–400)
RBC: 2.95 MIL/uL — ABNORMAL LOW (ref 3.87–5.11)
RDW: 17.7 % — ABNORMAL HIGH (ref 11.5–15.5)
WBC: 16.2 10*3/uL — ABNORMAL HIGH (ref 4.0–10.5)
nRBC: 0 % (ref 0.0–0.2)

## 2021-01-11 LAB — BASIC METABOLIC PANEL
Anion gap: 4 — ABNORMAL LOW (ref 5–15)
BUN: 22 mg/dL — ABNORMAL HIGH (ref 6–20)
CO2: 27 mmol/L (ref 22–32)
Calcium: 7.8 mg/dL — ABNORMAL LOW (ref 8.9–10.3)
Chloride: 107 mmol/L (ref 98–111)
Creatinine, Ser: 1.42 mg/dL — ABNORMAL HIGH (ref 0.44–1.00)
GFR, Estimated: 45 mL/min — ABNORMAL LOW (ref >60)
Glucose, Bld: 99 mg/dL (ref 70–99)
Potassium: 4.3 mmol/L (ref 3.5–5.1)
Sodium: 138 mmol/L (ref 135–145)

## 2021-01-11 LAB — GLUCOSE, CAPILLARY
Glucose-Capillary: 86 mg/dL (ref 70–99)
Glucose-Capillary: 123 mg/dL — ABNORMAL HIGH (ref 70–99)
Glucose-Capillary: 102 mg/dL — ABNORMAL HIGH (ref 70–99)
Glucose-Capillary: 87 mg/dL (ref 70–99)
Glucose-Capillary: 100 mg/dL — ABNORMAL HIGH (ref 70–99)

## 2021-01-11 MED ORDER — SODIUM CHLORIDE 0.9 % IV SOLN: 250.0000 mL | INTRAVENOUS | Status: DC | PRN

## 2021-01-11 MED ORDER — ATORVASTATIN CALCIUM 80 MG PO TABS
80.0000 mg | ORAL_TABLET | Freq: Every day | ORAL | Status: DC
  Administered 2021-01-11 – 2021-01-17 (×7): 80 mg via ORAL
  Filled 2021-01-11 (×7): qty 1

## 2021-01-11 MED ORDER — INSULIN ASPART 100 UNIT/ML IJ SOLN: 0.0000 [IU] | Freq: Three times a day (TID) | INTRAMUSCULAR | Status: DC

## 2021-01-11 MED ORDER — SODIUM CHLORIDE 0.9% FLUSH
3.0000 mL | Freq: Two times a day (BID) | INTRAVENOUS | Status: DC
  Administered 2021-01-11 – 2021-01-17 (×10): 3 mL via INTRAVENOUS

## 2021-01-11 MED ORDER — ~~LOC~~ CARDIAC SURGERY, PATIENT & FAMILY EDUCATION: Freq: Once | Status: AC

## 2021-01-11 MED ORDER — INSULIN ASPART 100 UNIT/ML IJ SOLN: 0.0000 [IU] | Freq: Every day | INTRAMUSCULAR | Status: DC

## 2021-01-11 MED ORDER — SODIUM CHLORIDE 0.9% FLUSH: 3.0000 mL | INTRAVENOUS | Status: DC | PRN

## 2021-01-11 NOTE — Progress Notes (Signed)
NAME:  Catherine Mcdonald, MRN:  [id number], DOB:  09-26-1970, LOS: 3 ADMISSION DATE:  01/08/2021, CONSULTATION DATE:  01/09/21 REFERRING MD:  Kipp Brood, CHIEF COMPLAINT:  post-aortic dissection repair  History of Present Illness:  [name] is a 50 y/o woman who presented to the hospital on 10/29 with acute onset left-sided CP radiating to her left neck and shoulder.  She had associated diaphoresis and nausea, but no SOB. With EMS she was hypotensive 90/60s. Upon arrival to the ED she was diagnosed with STEMI, but on further assessment was felt to possibly have an alternative cause of her symptoms. CTA demonstrated ascending and descending aortic dissection. She was taken to the OR emergently for dissection repair with platinum graft. Post-bypass she had mild coagulopathy treated with FFP, cryo, and factor VII concentrate. She was extubated this morning per post-op protocol.  She has never smoked. She was on amlodipine PTA, but per chart review had not been taking this. Her mother and maternal grandmother both had hypertension.   Pertinent  Medical History  HTN  Significant Hospital Events: Including procedures, antibiotic start and stop dates in addition to other pertinent events   10/28 admitted, OR for repair of type a aortic dissection with Hemi arch under deep hypothermic circulatory arrest. 10/29 extubated 10/31 for transfer to floor.  Interim History / Subjective:  Back on phenylephrine overnight.  2 units PRBCs overnight.  She is feeling overwhelmed but otherwise has no complaints this morning.  Objective   Blood pressure 102/68, pulse 81, temperature 98.9 F (37.2 C), temperature source Oral, resp. rate 14, height 5\' 10"  (1.778 m), weight 101.8 kg, last menstrual period 05/31/2012, SpO2 94 %.        Intake/Output Summary (Last 24 hours) at 01/11/2021 0830 Last data filed at 01/11/2021 0700 Gross per 24 hour  Intake 1647.49 ml  Output 1495 ml  Net 152.49 ml    Filed  Weights   01/08/21 1430 01/10/21 0700 01/11/21 0500  Weight: 95.3 kg 103.6 kg 101.8 kg    Examination: General: middle aged woman sitting up in the chair in NAD HENT: [place]/AT, eyes anicteric Lungs: Breathing comfortably on [place], bibasilar rhales cleared with deeper breaths. <500cc on IS. Cardiovascular: S1S2, RRR sternal dressing intact. Serosanguinous drainage from  Abdomen: soft, NT, ND Extremities: no LE edema, symmetric DP & PT pulses.  Neuro: awake, but falls back asleep when not being stimulated. Follows commands in all extremities GU: foley draining yellow urine. Derm: warm, dry  Chest tube output 740 cc  CXR personally reviewed> bilateral chest tubes, likely atelectasis vs infiltrate RLL BUN 22 Cr 1.82 WBC 15.3 H/H 8.7/26.2 Platelets 108 A1c 5.5  Resolved Hospital Problem list     Assessment & Plan:  Was critically ill due to acute type a aortic dissection now status post Hemi arch repair. Acute expected postoperative hypoxic respiratory insufficiency Acute kidney injury, possible chronic renal failure Acute anemia due to operative blood loss, status post 2 units PRBCs overnight  Acute thrombocytopenia, likely consumptive postoperatively Hyperglycemia controlled, prediabetes Dyslipidemia  Plan:  -Ready for transfer to PCU today. -On metoprolol for hypertension.  Blood pressure will likely begin to increase further as vasoreactivity improves following cardiopulmonary bypass run -Continue progressive ambulation and incentive spirometry.  Wean off oxygen as tolerated. -On atorvastatin today.  Best Practice (right click and "Reselect all SmartList Selections" daily)   Diet/type: Full diet DVT prophylaxis: Subcu heparin GI prophylaxis: H2B Lines: Remove all lines Foley: Remove Foley catheter Code Status:  full  code Last date of multidisciplinary goals of care discussion [husband updated at bedside 10/31]  Labs   CBC: Recent Labs  Lab 01/08/21 1427  01/08/21 1638 01/09/21 0500 01/09/21 1119 01/09/21 1243 01/09/21 1533 01/10/21 0405 01/10/21 1411 01/11/21 0400  WBC 8.4   < > 13.3*  --   --  12.1* 15.3* 18.0* 16.2*  NEUTROABS 6.0  --   --   --   --   --   --   --   --   HGB 13.6   < > 8.0*   < > 7.1* 7.3* 8.7* 8.9* 8.1*  HCT 42.8   < > 23.6*   < > 21.0* 23.0* 26.2* 27.9* 25.1*  MCV 81.1   < > 82.2  --   --  83.9 82.1 84.0 85.1  PLT 255   < > 137*  --   --  128* 108* 127* 120*   < > = values in this interval not displayed.     Basic Metabolic Panel: Recent Labs  Lab 01/09/21 0134 01/09/21 0358 01/09/21 0500 01/09/21 1119 01/09/21 1243 01/09/21 1533 01/10/21 0405 01/10/21 1411 01/11/21 0400  NA 138   < > 143   < > 145 144 139 139 138  K 4.0   < > 4.3   < > 3.8 3.8 3.6 3.7 4.3  CL 109  --  112*  --   --  112* 108 107 107  CO2 23  --  25  --   --  24 25 26 27   GLUCOSE 126*  --  110*  --   --  100* 114* 107* 99  BUN 14  --  15  --   --  20 22* 23* 22*  CREATININE 1.54*  --  1.67*  --   --  1.90* 1.82* 1.66* 1.42*  CALCIUM 6.9*  --  7.3*  --   --  7.6* 7.6* 7.8* 7.8*  MG 2.4  --   --   --   --  3.0*  --  2.9*  --    < > = values in this interval not displayed.    GFR: Estimated Creatinine Clearance: 60.5 mL/min (A) (by C-G formula based on SCr of 1.42 mg/dL (H)). Recent Labs  Lab 01/09/21 1533 01/10/21 0405 01/10/21 1411 01/11/21 0400  WBC 12.1* 15.3* 18.0* 16.2*     Liver Function Tests: Recent Labs  Lab 01/08/21 1427  AST 18  ALT 14  ALKPHOS 60  BILITOT 0.9  PROT 5.8*  ALBUMIN 3.5    No results for input(s): LIPASE, AMYLASE in the last 168 hours. No results for input(s): AMMONIA in the last 168 hours.  ABG    Component Value Date/Time   PHART 7.341 (L) 01/09/2021 1243   PCO2ART 45.7 01/09/2021 1243   PO2ART 70 (L) 01/09/2021 1243   HCO3 24.7 01/09/2021 1243   TCO2 26 01/09/2021 1243   ACIDBASEDEF 1.0 01/09/2021 1243   O2SAT 92.0 01/09/2021 1243      Coagulation Profile: Recent Labs   Lab 01/08/21 1416 01/09/21 0108 01/09/21 0500  INR 1.1 1.7* 1.0    Kipp Brood, MD [facility] ICU Physician [place]  Pager: [phone] Or Epic Secure Chat After hours: [phone].  01/11/2021, 8:47 AM   '

## 2021-01-11 NOTE — Discharge Instructions (Signed)
Discharge Instructions:  1. You may shower, please wash incisions daily with soap and water and keep dry.  If you wish to cover wounds with dressing you may do so but please keep clean and change daily.  No tub baths or swimming until incisions have completely healed.  If your incisions become red or develop any drainage please call our office at [phone]  2. No Driving until cleared by Dr Deliah Goody and you are no longer using narcotic pain medications  3. Monitor your weight daily.. Please use the same scale and weigh at same time... If you gain 5-10 lbs in 48 hours with associated lower extremity swelling, please contact our office at [phone]  4. Fever of 101.5 for at least 24 hours with no source, please contact our office at [phone]  5. Activity- up as tolerated, please walk at least 3 times per day.  Avoid strenuous activity, no lifting, pushing, or pulling with your arms over 8-10 lbs for a minimum of 6 weeks  6. If any questions or concerns arise, please do not hesitate to contact our office at [phone]

## 2021-01-11 NOTE — Progress Notes (Signed)
[address]       [place] [address]             [phone]                 3 Days Post-Op Procedure(s) (LRB): Repair of Acute Ascending Thoracic Aortic Dissection Using Hemashield Platinum Graft Size 28MM (N/A) TRANSESOPHAGEAL ECHOCARDIOGRAM (TEE) (N/A)   Events: No events _______________________________________________________________ Vitals: BP 102/68   Pulse 81   Temp 98.2 F (36.8 C) (Axillary)   Resp 14   Ht 5\' 10"  (1.778 m)   Wt 101.8 kg   LMP 05/31/2012   SpO2 94%   BMI 32.20 kg/m  Filed Weights   01/08/21 1430 01/10/21 0700 01/11/21 0500  Weight: 95.3 kg 103.6 kg 101.8 kg     - Neuro: alert NAD  - Cardiovascular: sinus  Drips: none     - Pulm:  EWOB ABG    Component Value Date/Time   PHART 7.341 (L) 01/09/2021 1243   PCO2ART 45.7 01/09/2021 1243   PO2ART 70 (L) 01/09/2021 1243   HCO3 24.7 01/09/2021 1243   TCO2 26 01/09/2021 1243   ACIDBASEDEF 1.0 01/09/2021 1243   O2SAT 92.0 01/09/2021 1243    - Abd: soft, ND - Extremity: warm  .Intake/Output      10/30 0701 10/31 0700 10/31 0701 11/01 0700   P.O. 840    I.V. (mL/kg) 343.6 (3.4)    Blood     Other 100    NG/GT     IV Piggyback 380.2    Total Intake(mL/kg) 1663.8 (16.3)    Urine (mL/kg/hr) 1145 (0.5)    Emesis/NG output     Drains     Stool 0    Chest Tube 380    Total Output 1525    Net +138.8         Stool Occurrence 1 x       _______________________________________________________________ Labs: CBC Latest Ref Rng & Units 01/11/2021 01/10/2021 01/10/2021  WBC 4.0 - 10.5 K/uL 16.2(H) 18.0(H) 15.3(H)  Hemoglobin 12.0 - 15.0 g/dL 8.1(L) 8.9(L) 8.7(L)  Hematocrit 36.0 - 46.0 % 25.1(L) 27.9(L) 26.2(L)  Platelets 150 - 400 K/uL 120(L) 127(L) 108(L)   CMP Latest Ref Rng & Units 01/11/2021 01/10/2021 01/10/2021  Glucose 70 - 99 mg/dL 99 107(H) 114(H)  BUN 6 - 20 mg/dL 22(H) 23(H) 22(H)  Creatinine 0.44 - 1.00 mg/dL 1.42(H) 1.66(H) 1.82(H)  Sodium  135 - 145 mmol/L 138 139 139  Potassium 3.5 - 5.1 mmol/L 4.3 3.7 3.6  Chloride 98 - 111 mmol/L 107 107 108  CO2 22 - 32 mmol/L 27 26 25   Calcium 8.9 - 10.3 mg/dL 7.8(L) 7.8(L) 7.6(L)  Total Protein 6.5 - 8.1 g/dL - - -  Total Bilirubin 0.3 - 1.2 mg/dL - - -  Alkaline Phos 38 - 126 U/L - - -  AST 15 - 41 U/L - - -  ALT 0 - 44 U/L - - -    CXR: PV congestion,  _______________________________________________________________  Assessment and Plan: POD 2 s/p type A dissection repair  Neuro: pain controlled CV: stable BP.  Goal SBP 100-130s.  We will start ACE or ARB once blood pressure is a little bit higher. Pulm: aggressive pulm toilet Renal: good uop.  Creat trending down.  Will continue to watch GI: advancing diet Heme: stable after product ID: [email or web address] Endo: SSI  Dispo: We will transfer to the floor.   Catherine Mcdonald 01/11/2021 7:34  AM

## 2021-01-11 NOTE — Plan of Care (Signed)

## 2021-01-11 NOTE — Discharge Summary (Addendum)
[address]       [place] [address]             [phone]    Physician Discharge Summary  Patient ID: Catherine Mcdonald MRN: [id number] DOB/AGE: 1970-10-22 50 y.o.  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  This occurred earlier today at 1 PM.  She was originally ruled in for STEMI given ST depressions in inferior leads.  She was then taken to the CT scanner where type a dissection was identified.  CTS was consulted to assist with management.  Hospital Course:  She was evaluated by Dr. Kipp Brood who felt emergent surgery would be indicated.  She was taken to the operating room and underwent Right axillary cannulation with a 8 mm graft, antegrade cerebral perfusion, aortic valve re-suspension, and replacement of Ascending aorta and arch.  She tolerated the procedure without difficulty and was taken to the SICU in stable condition.  She was weaned and extubated on POD #1.  Her arterial line was removed.  Her creatinine was elevated felt to be related to ischemic injury from dissection.  She had good urinary output and her creatinine improved over time without further intervention. It is quickly normalizing and the most recent value on 11/04 is 0.96.  She was weaned off Neo-synephrine as tolerated.    On postop day 4 she was transferred  to the stepdown unit.  Her diet was advanced.  She continued on aggressive routine cardiac rehab modalities.  She continued on aggressive pulmonary hygiene modalities.  Her expected acute blood loss anemia has stabilized.  She did receive products.  Oxygen has been weaned and she maintains good saturations on room air.  Incisions are noted to be healing well without evidence of infection.  Her blood pressure has been well controlled. She was felt surgically stable for transfer from the ICU to 4E for further convalescence on 11/02. Chest tube output has drastically decreased so all were removed on 11/03. Her BP was well controlled with low dose Lopressor, which was changed to Toprol XL. She had left hand swelling and LUE swelling so duplex US was checked to rule out DVT. There was no DVT, only superficial vein thrombosis left cephalic vein. Per patient request, 3-in-1 ordered. I have contacted Dr. Oval Linsey and she has agreed to see the patient in her hypertensive clinic as an outpatient after discharge. Her office will contact the patient with follow up instructions.  In the interim, she will see a midlevel 11/18 in follow up. Discharge was planned for 01/16/21 but she was noted to have increased WBC to 16,200 and LUQ pain.  We delayed discharge for 24 hours and check a UA that was negative for UTI. CXR showed bibasilar ATX and some pulmonary congestion. On the following day she was feeling much better. Abdominal pain resolved.  She  was tolerating PO's and having appropriate bowel function. She was felt medically stable for discharge.  She is encouraged to continue working on pulmonary hygiene at home with the incentive spirometer and also advised to take Mucinex 600 mg p.o. twice daily for 5 days.  Consults: None  Significant Diagnostic Studies:  DG Chest 2 View  Result Date: 01/16/2021 CLINICAL DATA:  Postop from ascending thoracic aortic aneurysm and dissection repair. Follow-up left pneumothorax. EXAM:  CHEST - 2 VIEW COMPARISON:  01/15/2021 FINDINGS: Stable mild cardiomegaly. No pneumothorax seen on today's exam. Pulmonary vascular congestion remains stable. Small bilateral pleural effusions, and bibasilar atelectasis or infiltrates, show no significant change. IMPRESSION: No pneumothorax visualized. Stable cardiomegaly and pulmonary vascular congestion. Bilateral pleural effusions and bibasilar atelectasis versus infiltrates, without significant change. Electronically Signed   By: Marlaine Hind M.D.   On: 01/16/2021 11:30   DG Chest 2 View  Result Date: 01/15/2021 CLINICAL DATA:  Pneumothorax, recent cardiac surgery EXAM: CHEST - 2 VIEW COMPARISON:  Previous studies including the examination of 01/11/2021 FINDINGS: Transverse diameter of heart is increased. Central pulmonary vessels are prominent. Increased density is seen in both lower lung fields, more so on the right side. There is slight improvement in aeration in the left lower lung fields. Small bilateral pleural effusions are seen with interval increase in right pleural effusion. There is interval removal vascular sheath from right side of neck. There is interval removal of chest tubes. There is linear density in the lateral aspect of left upper and left mid lung fields. Possibility of small pneumothorax in the left side is not excluded. IMPRESSION: There is interval removal of bilateral chest tubes and vascular sheath. Cardiomegaly. There are no signs of alveolar pulmonary edema. There is improvement in aeration of left lower lung fields suggesting decrease in atelectasis. Bilateral pleural effusion with interval increase in right pleural effusion. There is linear radiopacity in the lateral aspect of left upper and left mid lung fields which may suggest small loculated left pneumothorax or an artifact. Short-term follow-up chest radiographs should be considered. Electronically Signed   By: Elmer Picker M.D.   On: 01/15/2021 08:34   CT Angio  Chest PE W and/or Wo Contrast  Result Date: 01/08/2021 CLINICAL DATA:  Chest pain EXAM: CT ANGIOGRAPHY CHEST WITH CONTRAST TECHNIQUE: Multidetector CT imaging of the chest was performed using the standard protocol during bolus administration of intravenous contrast. Multiplanar CT image reconstructions and MIPs were obtained to evaluate the vascular anatomy. CONTRAST:  47mL OMNIPAQUE IOHEXOL 350 MG/ML SOLN COMPARISON:  01/08/2021 FINDINGS: Cardiovascular: This is a technically adequate evaluation of the pulmonary vasculature. No filling defects or pulmonary emboli. There is a type A thoracic aortic dissection, extending from the aortic root through the diaphragmatic hiatus. The distal margin of the aortic dissection is not visualized due to slice selection. The true lumen gives rise to the great vessels. I do not see any evidence of aortic rupture. Maximal diameter of the ascending thoracic aorta measures 5.1 cm. The heart is unremarkable without pericardial effusion. Mediastinum/Nodes: No enlarged mediastinal, hilar, or axillary lymph nodes. Thyroid gland, trachea, and esophagus demonstrate no significant findings. Lungs/Pleura: No acute airspace disease, effusion, or pneumothorax. Central airways are patent. Minimal hypoventilatory changes at the lung bases. Upper Abdomen: Limited imaging through the upper abdomen demonstrates no additional findings. Musculoskeletal: There are no acute or destructive bony lesions. Reconstructed images demonstrate no additional findings. Review of the MIP images confirms the above findings. IMPRESSION: 1. 5.1 cm ascending thoracic  aortic aneurysm, with superimposed type A thoracic aortic dissection. Dissection extends from the aortic root through the diaphragmatic hiatus. The inferior margin of the dissection is not visualized due to slice selection. 2. No evidence of pulmonary embolus. Critical Value/emergent results were called by telephone at the time of interpretation on  01/08/2021 at 5:09 pm to provider [facility] , who verbally acknowledged these results. Electronically Signed   By: Randa Ngo M.D.   On: 01/08/2021 17:32   DG Chest Port 1 View  Result Date: 01/11/2021 CLINICAL DATA:  Pneumothorax EXAM: PORTABLE CHEST 1 VIEW COMPARISON:  01/10/2021 FINDINGS: Unchanged AP portable chest radiograph with bilateral chest tubes. No significant residual pneumothorax. Right neck vascular sheath remains in position. Probable small layering pleural effusions and associated atelectasis or consolidation. Cardiomegaly status post median sternotomy. IMPRESSION: Unchanged AP portable chest radiograph with bilateral chest tubes. No significant residual pneumothorax. Electronically Signed   By: Delanna Ahmadi M.D.   On: 01/11/2021 08:45   DG Chest Port 1 View  Result Date: 01/10/2021 CLINICAL DATA:  Pneumothorax. EXAM: PORTABLE CHEST 1 VIEW COMPARISON:  January 09, 2021. FINDINGS: Stable cardiomegaly. Endotracheal and nasogastric tubes have been removed. Swan-Ganz catheter has been removed. No pneumothorax is noted. Stable mediastinal drain. Stable right-sided chest tube. Stable bibasilar atelectasis is noted with possible small left pleural effusion. Bony thorax is unremarkable. IMPRESSION: Endotracheal and nasogastric tubes have been removed. Stable right-sided chest tube is noted without definite pneumothorax. Stable bibasilar atelectasis is noted with possible small left pleural effusion. Electronically Signed   By: Marijo Conception M.D.   On: 01/10/2021 09:45   DG CHEST PORT 1 VIEW  Result Date: 01/09/2021 CLINICAL DATA:  50 year old female with chest pain EXAM: PORTABLE CHEST 1 VIEW COMPARISON:  Prior chest x-ray earlier today at 1:25 a.m. FINDINGS: The patient remains intubated. The tip of the endotracheal tube is approximately 2.7 cm above the carina. A right IJ vascular sheath conveys a Swan-Ganz catheter into the heart. The tip of the Swan overlies the proximal main  pulmonary outflow tract. Patient is status post median sternotomy. Probable pericardial drain and right basilar chest tube in place. Small amount of fluid remains trapped within the right minor fissure. Mild pulmonary vascular congestion without overt edema. No large pneumothorax. No acute osseous abnormality. IMPRESSION: 1. Stable and satisfactory support apparatus. 2. Slightly lower inspiratory volumes with increased pulmonary vascular congestion which may simply reflect crowding of the pulmonary vessels due to the lower volumes. 3. Small amount of fluid again noted within the minor fissure on the right. Electronically Signed   By: Jacqulynn Cadet M.D.   On: 01/09/2021 11:39   DG Chest Port 1 View  Result Date: 01/09/2021 CLINICAL DATA:  Respiratory failure, pneumothorax EXAM: PORTABLE CHEST 1 VIEW COMPARISON:  2:48 p.m. FINDINGS: Endotracheal tube is seen 4.5 cm above the carina. Nasogastric tube extends into the proximal body of the stomach. Right internal jugular Swan-Ganz catheter tip is seen within the expected main pulmonary artery. Bilateral chest tubes are in place. Lungs are well expanded. Opacity within the right mid lung zone may represent fluid within the fissure. Small laterally loculated right pleural effusion is present. No pneumothorax. Median sternotomy has been performed. Cardiac size is at the upper limits of normal. IMPRESSION: Support lines and tubes as described above. Swan-Ganz catheter tip within the main pulmonary artery. Bilateral chest tubes in place.  No pneumothorax. Small right pleural effusion. Electronically Signed   By: Fidela Salisbury M.D.   On:  01/09/2021 01:59   DG Chest Port 1 View  Result Date: 01/08/2021 CLINICAL DATA:  STEMI EXAM: PORTABLE CHEST 1 VIEW COMPARISON:  Chest x-ray dated April 21, 2009 FINDINGS: Cardiac and mediastinal contours are within normal limits for AP technique. Mild bilateral heterogeneous opacities. No large pleural effusion or  pneumothorax. IMPRESSION: Mild bilateral heterogeneous opacities, differential includes pulmonary edema versus atelectasis. Electronically Signed   By: Yetta Glassman M.D.   On: 01/08/2021 15:03   ECHO INTRAOPERATIVE TEE  Result Date: 01/12/2021  *INTRAOPERATIVE TRANSESOPHAGEAL REPORT *  Patient Name:   MAUDIE SHINGLEDECKER Date of Exam: 01/08/2021 Medical Rec #:  [id number]         Height:       70.0 in Accession #:    [id number]        Weight:       210.0 lb Date of Birth:  11-15-1970         BSA:          2.13 m Patient Age:    66 years          BP:           143/73 mmHg Patient Gender: F                 HR:           80 bpm. Exam Location:  Anesthesiology Transesophogeal exam was perform intraoperatively during surgical procedure. Patient was closely monitored under general anesthesia during the entirety of examination. Indications:     Type A Dissection Sonographer:     Wenda Low Performing Phys: Hoy Morn MD Complications: No known complications during this procedure. POST-OP IMPRESSIONS _ Left Ventricle: The left ventricle is unchanged from pre-bypass. has normal systolic function, with an ejection fraction of 60%. The cavity size was normal. The wall motion is normal. _ Right Ventricle: The right ventricle appears unchanged from pre-bypass. _ Aorta: A graft was placed in the ascending aorta for repair.Type A dissection repair with hemiarch. _ Aortic Valve: There is no regurgitation. _ Mitral Valve: The mitral valve appears unchanged from pre-bypass. _ Tricuspid Valve: The tricuspid valve appears unchanged from pre-bypass. _ Pulmonic Valve: The pulmonic valve appears unchanged from pre-bypass. _ Interatrial Septum: The interatrial septum appears unchanged from pre-bypass. _ Pericardium: The pericardium appears unchanged from pre-bypass. _ Comments: Post-bypass images reviewed with surgeon. PRE-OP FINDINGS  Left Ventricle: The left ventricle has normal systolic function, with an ejection fraction  of 60-65%. The cavity size was normal. There is moderate concentric left ventricular hypertrophy. Right Ventricle: The right ventricle has normal systolic function. The cavity was normal. There is increased right ventricular wall thickness. Left Atrium: Left atrial size was normal in size. No left atrial/left atrial appendage thrombus was detected. Left atrial appendage velocity is normal at greater than 40 cm/s. Right Atrium: Right atrial size was normal in size. Interatrial Septum: No atrial level shunt detected by color flow Doppler. Pericardium: There is no evidence of pericardial effusion. Mitral Valve: The mitral valve is normal in structure. Mitral valve regurgitation is trivial by color flow Doppler. Tricuspid Valve: The tricuspid valve was normal in structure. Tricuspid valve regurgitation was not visualized by color flow Doppler. Aortic Valve: The aortic valve is tricuspid Aortic valve regurgitation is severe by color flow Doppler. There is no stenosis of the aortic valve. Pulmonic Valve: The pulmonic valve was normal in structure. Pulmonic valve regurgitation is not visualized by color flow Doppler. Aorta: There is an aneurysm involving the  ascending aorta measuring 49 mm. There is evidence of a dissection in the aortic root, ascending aorta and aortic arch. The dissection can be classified as a Stanford type A (proximal). The dissection entry point  is at the aortic root. A false lumen can be seen that contains thrombus.  Hoy Morn MD Electronically signed by Hoy Morn MD Signature Date/Time: 01/12/2021/8:44:51 AM    Final    VAS [place] UPPER EXTREMITY VENOUS DUPLEX  Result Date: 01/14/2021 UPPER VENOUS STUDY  Patient Name:  PADME ARRIAGA  Date of Exam:   01/14/2021 Medical Rec #: [id number]          Accession #:    [id number] Date of Birth: March 02, 1971          Patient Gender: F Patient Age:   6 years Exam Location:  [facility] Procedure:      VAS [place] UPPER EXTREMITY VENOUS DUPLEX  Referring Phys: Lars Pinks --------------------------------------------------------------------------------  Indications: Swelling, and Edema Comparison Study: no prior Performing Technologist: Archie Patten RVS  Examination Guidelines: A complete evaluation includes B-mode imaging, spectral Doppler, color Doppler, and power Doppler as needed of all accessible portions of each vessel. Bilateral testing is considered an integral part of a complete examination. Limited examinations for reoccurring indications may be performed as noted.  Right Findings: +----------+------------+---------+-----------+----------+--------------+ RIGHT     CompressiblePhasicitySpontaneousProperties   Summary     +----------+------------+---------+-----------+----------+--------------+ Subclavian                                          Not visualized +----------+------------+---------+-----------+----------+--------------+  Left Findings: +----------+------------+---------+-----------+----------+-----------------+ LEFT      CompressiblePhasicitySpontaneousProperties     Summary      +----------+------------+---------+-----------+----------+-----------------+ IJV           Full       Yes       Yes                                +----------+------------+---------+-----------+----------+-----------------+ Subclavian    Full       Yes       Yes                                +----------+------------+---------+-----------+----------+-----------------+ Axillary      Full       Yes       Yes                                +----------+------------+---------+-----------+----------+-----------------+ Brachial      Full       Yes       Yes                                +----------+------------+---------+-----------+----------+-----------------+ Radial        Full                                                     +----------+------------+---------+-----------+----------+-----------------+ Ulnar         Full                                                    +----------+------------+---------+-----------+----------+-----------------+  Cephalic      None                                  Age Indeterminate +----------+------------+---------+-----------+----------+-----------------+ Basilic       Full                                                    +----------+------------+---------+-----------+----------+-----------------+  Summary:  Left: No evidence of deep vein thrombosis in the upper extremity. Findings consistent with age indeterminate superficial vein thrombosis involving the left cephalic vein.  *See table(s) above for measurements and observations.  Diagnosing physician: Jamelle Haring Electronically signed by Jamelle Haring on 01/14/2021 at 3:19:11 PM.    Final      Treatments: surgery:  01/09/2021 Patient:  Celine Ahr Pre-Op Dx: Type A dissection   Post-op Dx: Same Procedure: Right axillary artery cannulation with 8 mm graft Antegrade cerebral perfusion. Aortic valve resuspension Type A aortic dissection repair with 28 mm graft Type A dissection repair with hemiarch.   Surgeon and Role:      * Lightfoot, Lucile Crater, MD - Primary  Discharge Exam: Blood pressure 132/88, pulse 90, temperature 99 F (37.2 C), temperature source Oral, resp. rate 20, height 5\' 10"  (1.778 m), weight 102 kg, last menstrual period 05/31/2012, SpO2 98 %. Cardiovascular: RRR, no murmur Pulmonary: Slightly diminished bibasilar breath sounds Abdomen: Soft, non tender, bowel sounds present. Extremities: Trace  bilateral lower extremity edema. Left hand/arm with swelling but decreased from previous day. Pulses, motor/sensory intact Wounds: Right axillary and sternal wounds are clean and dry.  No erythema or signs of infection.   Discharge Medications:  The patient has been discharged  on:   1.Beta Blocker:  Yes [  x ]                              No   [   ]                              If No, reason:  2.Ace Inhibitor/ARB: Yes [   ]                                     No  [  x  ]                                     If No, reason:Labile BP  3.Statin:   Yes [  x ]                  No  [   ]                  If No, reason:  4.Ecasa:  Yes  [   x]                  No   [   ]                  If No,  reason:  Patient had ACS upon admission:No  Plavix/P2Y12 inhibitor: Yes [   ]                                      No  [  x ]     Discharge Instructions     AMB Referral to Cardiac Rehabilitation - Phase II   Complete by: As directed    Diagnosis: Valve Repair   Valve: Aortic   After initial evaluation and assessments completed: Virtual Based Care may be provided alone or in conjunction with Phase 2 Cardiac Rehab based on patient barriers.: Yes      Allergies as of 01/17/2021   No Known Allergies      Medication List     STOP taking these medications    amLODipine 5 MG tablet Commonly known as: NORVASC       TAKE these medications    aspirin 325 MG EC tablet Take 1 tablet (325 mg total) by mouth daily.   atorvastatin 80 MG tablet Commonly known as: LIPITOR Take 1 tablet (80 mg total) by mouth daily.   furosemide 40 MG tablet Commonly known as: LASIX Take 1 tablet (40 mg total) by mouth daily. For one week then stop   guaiFENesin 600 MG 12 hr tablet Commonly known as: Mucinex Take 1 tablet (600 mg total) by mouth 2 (two) times daily. For 5 days.   losartan 25 MG tablet Commonly known as: COZAAR Take 1 tablet (25 mg total) by mouth daily.   methocarbamol 500 MG tablet Commonly known as: ROBAXIN Take 1 tablet (500 mg total) by mouth every 8 (eight) hours as needed for muscle spasms.   metoprolol succinate 25 MG 24 hr tablet Commonly known as: TOPROL-XL Take 1 tablet (25 mg total) by mouth daily.   potassium chloride SA 20 MEQ  tablet Commonly known as: KLOR-CON Take 1.5 tablets (30 mEq total) by mouth daily. For one week then stop.   psyllium 58.6 % powder Commonly known as: METAMUCIL Take 1 packet by mouth daily.   traMADol 50 MG tablet Commonly known as: ULTRAM Take 1 tablet (50 mg total) by mouth every 6 (six) hours as needed for moderate pain.               Durable Medical Equipment  (From admission, onward)           Start     Ordered   01/15/21 1027  For home use only DME Walker rolling  Once       Question Answer Comment  Walker: With [employer]   Patient needs a walker to treat with the following condition Imbalance      01/15/21 1027   01/15/21 0659  For home use only DME 3 n 1  Once        01/15/21 0658            Follow-up Information     Lightfoot, Lucile Crater, MD Follow up.   Specialty: Cardiothoracic Surgery Why: Please see discharge paperwork for follow-up appointment with your surgeon in 1 week.  Your first appointment will be a video appointment.  Do not go to the office.  Subsequent appointment will be in person. Contact information: [place] [address] [phone]         Darreld Mclean, PA-C Follow up.   Specialties: Librarian, academic,  Cardiology Why: Hospital follow-up with Cardiology scheduled for 01/29/2021 at 2:20pm. Please arrive 15 minutes early for check-in. If this date/time does not work for you, please call our office to reschedule. Contact information: [address] [place] [address] [phone]         [facility] Follow up.   Why: rolling walker and 3n1 arranged- to be delivered to room prior to discharge Contact information: [address] [place] [address] [phone]                 Signed: Antony Odea, PA-C  01/17/2021, 10:40 AM

## 2021-01-11 NOTE — Progress Notes (Signed)
Patient ID: Catherine Mcdonald, female   DOB: 1970-09-12, 50 y.o.   MRN: [id number] TCTS Evening Rounds:  Hemodynamically stable in sinus rhythm.  Sats 96%  Urine output ok  No problems today. Awaiting transfer.

## 2021-01-11 NOTE — Progress Notes (Signed)
Progress Note  Patient Name: JAIDON SPONSEL Date of Encounter: 01/11/2021  [facility] Cardiologist: Pixie Casino, MD   Subjective   Feeling tired. BP remains low but off of pressors.  Inpatient Medications    Scheduled Meds:  sodium chloride   Intravenous Once   sodium chloride   Intravenous Once   acetaminophen  1,000 mg Oral Q6H   Or   acetaminophen (TYLENOL) oral liquid 160 mg/5 mL  1,000 mg Per Tube Q6H   aspirin EC  325 mg Oral Daily   Or   aspirin  324 mg Per Tube Daily   atorvastatin  80 mg Oral Daily   bisacodyl  10 mg Oral Daily   Or   bisacodyl  10 mg Rectal Daily   Chlorhexidine Gluconate Cloth  6 each Topical Daily   [facility] Patient [facility] Family Education   Does not apply Once   docusate sodium  200 mg Oral Daily   enoxaparin (LOVENOX) injection  40 mg Subcutaneous QHS   fentaNYL (SUBLIMAZE) injection  50 mcg Intravenous Once   insulin aspart  0-24 Units Subcutaneous TID WC   insulin aspart  0-5 Units Subcutaneous QHS   mouth rinse  15 mL Mouth Rinse BID   metoprolol tartrate  12.5 mg Oral BID   Or   metoprolol tartrate  12.5 mg Per Tube BID   sodium chloride flush  10-40 mL Intracatheter Q12H   sodium chloride flush  3 mL Intravenous Q12H   sodium chloride flush  3 mL Intravenous Q12H   sodium chloride flush  3 mL Intravenous Q12H   Continuous Infusions:  sodium chloride Stopped (01/09/21 1100)   sodium chloride Stopped (01/10/21 0812)   sodium chloride 500 mL (01/10/21 2117)   sodium chloride     sodium chloride Stopped (01/09/21 1100)   sodium chloride     lactated ringers     lactated ringers Stopped (01/11/21 1016)   lactated ringers Stopped (01/09/21 1606)   PRN Meds: sodium chloride, sodium chloride, dextrose, lactated ringers, metoprolol tartrate, morphine injection, ondansetron (ZOFRAN) IV, oxyCODONE, sodium chloride flush, sodium chloride flush, sodium chloride flush, traMADol   Vital Signs    Vitals:    01/11/21 1300 01/11/21 1400 01/11/21 1500 01/11/21 1611  BP: 102/64 98/66 106/71   Pulse: 81 81 84   Resp: 14 20 18    Temp:    98.3 F (36.8 C)  TempSrc:    Oral  SpO2: 95% 94% 96%   Weight:      Height:        Intake/Output Summary (Last 24 hours) at 01/11/2021 1648 Last data filed at 01/11/2021 1430 Gross per 24 hour  Intake 695.98 ml  Output 1405 ml  Net -709.02 ml   Last 3 Weights 01/11/2021 01/10/2021 01/08/2021  Weight (lbs) 224 lb 6.9 oz 228 lb 6.3 oz 210 lb  Weight (kg) 101.8 kg 103.6 kg 95.255 kg      Telemetry    NST with PVC triplet this AM - Personally Reviewed  ECG    10/29 NSR, TWI, no prior comparisons before current admission - Personally Reviewed  Physical Exam   GEN: Sitting up in chair, appears tired Neck: No JVD Cardiac: RRR, no murmurs, rubs, or gallops. Sternal incisions c/d/i Respiratory: Clear to auscultation anteriorly/laterally, on room air GI: Soft, nontender, non-distended  MS: No edema; No deformity. Neuro:  Nonfocal  Psych: Normal affect   Labs    High Sensitivity Troponin:  Recent Labs  Lab 01/08/21 1416 01/08/21 1616  TROPONINIHS 8 10     Chemistry Recent Labs  Lab 01/08/21 1427 01/08/21 1638 01/09/21 0134 01/09/21 0358 01/09/21 1533 01/10/21 0405 01/10/21 1411 01/11/21 0400  NA 139   < > 138   < > 144 139 139 138  K 3.5   < > 4.0   < > 3.8 3.6 3.7 4.3  CL 105   < > 109   < > 112* 108 107 107  CO2 26  --  23   < > 24 25 26 27   GLUCOSE 100*   < > 126*   < > 100* 114* 107* 99  BUN 9   < > 14   < > 20 22* 23* 22*  CREATININE 1.35*   < > 1.54*   < > 1.90* 1.82* 1.66* 1.42*  CALCIUM 8.6*  --  6.9*   < > 7.6* 7.6* 7.8* 7.8*  MG  --   --  2.4  --  3.0*  --  2.9*  --   PROT 5.8*  --   --   --   --   --   --   --   ALBUMIN 3.5  --   --   --   --   --   --   --   AST 18  --   --   --   --   --   --   --   ALT 14  --   --   --   --   --   --   --   ALKPHOS 60  --   --   --   --   --   --   --   BILITOT 0.9  --   --    --   --   --   --   --   GFRNONAA 48*  --  41*   < > 32* 33* 37* 45*  ANIONGAP 8  --  6   < > 8 6 6  4*   < > = values in this interval not displayed.    Lipids  Recent Labs  Lab 01/08/21 1435  CHOL 253*  TRIG 48  HDL 77  LDLCALC 166*  CHOLHDL 3.3    Hematology Recent Labs  Lab 01/10/21 0405 01/10/21 1411 01/11/21 0400  WBC 15.3* 18.0* 16.2*  RBC 3.19* 3.32* 2.95*  HGB 8.7* 8.9* 8.1*  HCT 26.2* 27.9* 25.1*  MCV 82.1 84.0 85.1  MCH 27.3 26.8 27.5  MCHC 33.2 31.9 32.3  RDW 17.2* 17.5* 17.7*  PLT 108* 127* 120*   Thyroid No results for input(s): TSH, FREET4 in the last 168 hours.  BNP Recent Labs  Lab 01/08/21 1427  BNP 34.3    DDimer No results for input(s): DDIMER in the last 168 hours.   Radiology    DG Chest Port 1 View  Result Date: 01/11/2021 CLINICAL DATA:  Pneumothorax EXAM: PORTABLE CHEST 1 VIEW COMPARISON:  01/10/2021 FINDINGS: Unchanged AP portable chest radiograph with bilateral chest tubes. No significant residual pneumothorax. Right neck vascular sheath remains in position. Probable small layering pleural effusions and associated atelectasis or consolidation. Cardiomegaly status post median sternotomy. IMPRESSION: Unchanged AP portable chest radiograph with bilateral chest tubes. No significant residual pneumothorax. Electronically Signed   By: Delanna Ahmadi M.D.   On: 01/11/2021 08:45   DG Chest Port 1 View  Result Date: 01/10/2021 CLINICAL DATA:  Pneumothorax. EXAM:  PORTABLE CHEST 1 VIEW COMPARISON:  January 09, 2021. FINDINGS: Stable cardiomegaly. Endotracheal and nasogastric tubes have been removed. Swan-Ganz catheter has been removed. No pneumothorax is noted. Stable mediastinal drain. Stable right-sided chest tube. Stable bibasilar atelectasis is noted with possible small left pleural effusion. Bony thorax is unremarkable. IMPRESSION: Endotracheal and nasogastric tubes have been removed. Stable right-sided chest tube is noted without definite  pneumothorax. Stable bibasilar atelectasis is noted with possible small left pleural effusion. Electronically Signed   By: Marijo Conception M.D.   On: 01/10/2021 09:45    Cardiac Studies   Echo op TEE report pending  Patient Profile     50 y.o. female with PMH hypertension, presenting with chest pain and found to have type A aortic dissection.  Assessment & Plan    Type A aortic dissection -s/p OR 01/08/21 -chest tube output decreasing today  Hypertension -aim for beta blocker first given dissection history -blood pressures have been hypotensive since surgery, has been off phenylephrine since 10/30 afternoon. Started on low dose beta blocker and tolerating, will titrate slowly  Acute kidney injury -improving, Cr 1.42 today. Unknown baseline, was 1.35 on admission, nadir 1.10, peak 1.90  Post op anemia Thrombocytopenia -monitor -has received PRBC this admission  For questions or updates, please contact [place] Please consult [email or web address] for contact info under        Signed, Buford Dresser, MD  01/11/2021, 4:48 PM

## 2021-01-12 LAB — BASIC METABOLIC PANEL
Anion gap: 3 — ABNORMAL LOW (ref 5–15)
BUN: 20 mg/dL (ref 6–20)
CO2: 27 mmol/L (ref 22–32)
Calcium: 7.7 mg/dL — ABNORMAL LOW (ref 8.9–10.3)
Chloride: 106 mmol/L (ref 98–111)
Creatinine, Ser: 1.17 mg/dL — ABNORMAL HIGH (ref 0.44–1.00)
GFR, Estimated: 56 mL/min — ABNORMAL LOW (ref >60)
Glucose, Bld: 102 mg/dL — ABNORMAL HIGH (ref 70–99)
Potassium: 4.1 mmol/L (ref 3.5–5.1)
Sodium: 136 mmol/L (ref 135–145)

## 2021-01-12 LAB — ECHO INTRAOPERATIVE TEE
Height: 70 in
Weight: 3360 oz

## 2021-01-12 LAB — CBC
HCT: 24.1 % — ABNORMAL LOW (ref 36.0–46.0)
Hemoglobin: 7.5 g/dL — ABNORMAL LOW (ref 12.0–15.0)
MCH: 26.7 pg (ref 26.0–34.0)
MCHC: 31.1 g/dL (ref 30.0–36.0)
MCV: 85.8 fL (ref 80.0–100.0)
Platelets: 143 10*3/uL — ABNORMAL LOW (ref 150–400)
RBC: 2.81 MIL/uL — ABNORMAL LOW (ref 3.87–5.11)
RDW: 17.6 % — ABNORMAL HIGH (ref 11.5–15.5)
WBC: 14.5 10*3/uL — ABNORMAL HIGH (ref 4.0–10.5)
nRBC: 0.4 % — ABNORMAL HIGH (ref 0.0–0.2)

## 2021-01-12 LAB — GLUCOSE, CAPILLARY
Glucose-Capillary: 100 mg/dL — ABNORMAL HIGH (ref 70–99)
Glucose-Capillary: 81 mg/dL (ref 70–99)
Glucose-Capillary: 92 mg/dL (ref 70–99)

## 2021-01-12 LAB — SURGICAL PATHOLOGY

## 2021-01-12 MED ORDER — FUROSEMIDE 10 MG/ML IJ SOLN
40.0000 mg | Freq: Once | INTRAMUSCULAR | Status: AC
  Administered 2021-01-12: 40 mg via INTRAVENOUS
  Filled 2021-01-12: qty 4

## 2021-01-12 NOTE — Progress Notes (Signed)
      [address]       ,[place] [address]             [phone]                 4 Days Post-Op Procedure(s) (LRB): Repair of Acute Ascending Thoracic Aortic Dissection Using Hemashield Platinum Graft Size 28MM (N/A) TRANSESOPHAGEAL ECHOCARDIOGRAM (TEE) (N/A)   Events: No events _______________________________________________________________ Vitals: BP 106/66 (BP Location: Right Arm)   Pulse (!) 109   Temp 98.4 F (36.9 C) (Oral)   Resp (!) 30   Ht 5\' 10"  (1.778 m)   Wt 105.5 kg   LMP 05/31/2012   SpO2 93%   BMI 33.37 kg/m  Filed Weights   01/10/21 0700 01/11/21 0500 01/12/21 0500  Weight: 103.6 kg 101.8 kg 105.5 kg     - Neuro: alert NAD  - Cardiovascular: sinus  Drips: none     - Pulm:  EWOB ABG    Component Value Date/Time   PHART 7.341 (L) 01/09/2021 1243   PCO2ART 45.7 01/09/2021 1243   PO2ART 70 (L) 01/09/2021 1243   HCO3 24.7 01/09/2021 1243   TCO2 26 01/09/2021 1243   ACIDBASEDEF 1.0 01/09/2021 1243   O2SAT 92.0 01/09/2021 1243    - Abd: soft, ND - Extremity: warm  .Intake/Output      10/31 0701 11/01 0700 11/01 0701 11/02 0700   P.O. 360    I.V. (mL/kg) 78.5 (0.7)    Other     IV Piggyback     Total Intake(mL/kg) 438.5 (4.2)    Urine (mL/kg/hr) 470 (0.2)    Stool     Chest Tube 340    Total Output 810    Net -371.5         Urine Occurrence 2 x       _______________________________________________________________ Labs: CBC Latest Ref Rng & Units 01/12/2021 01/11/2021 01/10/2021  WBC 4.0 - 10.5 K/uL 14.5(H) 16.2(H) 18.0(H)  Hemoglobin 12.0 - 15.0 g/dL 7.5(L) 8.1(L) 8.9(L)  Hematocrit 36.0 - 46.0 % 24.1(L) 25.1(L) 27.9(L)  Platelets 150 - 400 K/uL 143(L) 120(L) 127(L)   CMP Latest Ref Rng & Units 01/12/2021 01/11/2021 01/10/2021  Glucose 70 - 99 mg/dL 102(H) 99 107(H)  BUN 6 - 20 mg/dL 20 22(H) 23(H)  Creatinine 0.44 - 1.00 mg/dL 1.17(H) 1.42(H) 1.66(H)  Sodium 135 - 145 mmol/L 136 138 139  Potassium 3.5  - 5.1 mmol/L 4.1 4.3 3.7  Chloride 98 - 111 mmol/L 106 107 107  CO2 22 - 32 mmol/L 27 27 26   Calcium 8.9 - 10.3 mg/dL 7.7(L) 7.8(L) 7.8(L)  Total Protein 6.5 - 8.1 g/dL - - -  Total Bilirubin 0.3 - 1.2 mg/dL - - -  Alkaline Phos 38 - 126 U/L - - -  AST 15 - 41 U/L - - -  ALT 0 - 44 U/L - - -    CXR: PV congestion,  _______________________________________________________________  Assessment and Plan: POD 3 s/p type A dissection repair  Neuro: pain controlled CV: stable BP.  Goal SBP 100-130s.  We will start ACE or ARB once blood pressure is a little bit higher. Pulm: aggressive pulm toilet Renal: good uop.  Creat trending down.  Will diurese today GI: on diet Heme: hgb down, will watch for now ID: afebrile Endo: SSI  Dispo: We will transfer to the floor.   Lajuana Matte 01/12/2021 8:04 AM

## 2021-01-12 NOTE — Progress Notes (Signed)
CARDIAC REHAB PHASE I   Offered to walk with pt, pt states recent return from second walk of the day. Encouraged continued ambulation and IS use.    Rufina Falco, RN BSN 01/12/2021 3:08 PM

## 2021-01-12 NOTE — Addendum Note (Signed)
Addendum  created 01/12/21 0851 by Josephine Igo, CRNA   Order list changed, Pharmacy for encounter modified

## 2021-01-13 LAB — CBC
HCT: 24.5 % — ABNORMAL LOW (ref 36.0–46.0)
Hemoglobin: 7.8 g/dL — ABNORMAL LOW (ref 12.0–15.0)
MCH: 27.4 pg (ref 26.0–34.0)
MCHC: 31.8 g/dL (ref 30.0–36.0)
MCV: 86 fL (ref 80.0–100.0)
Platelets: 204 10*3/uL (ref 150–400)
RBC: 2.85 MIL/uL — ABNORMAL LOW (ref 3.87–5.11)
RDW: 17.7 % — ABNORMAL HIGH (ref 11.5–15.5)
WBC: 13 10*3/uL — ABNORMAL HIGH (ref 4.0–10.5)
nRBC: 0.8 % — ABNORMAL HIGH (ref 0.0–0.2)

## 2021-01-13 LAB — BPAM RBC
Blood Product Expiration Date: 202211132359
Blood Product Expiration Date: 202211182359
Blood Product Expiration Date: 202211182359
Blood Product Expiration Date: 202211182359
Blood Product Expiration Date: 202211182359
Blood Product Expiration Date: 202211192359
Blood Product Expiration Date: 202211192359
Blood Product Expiration Date: 202211192359
ISSUE DATE / TIME: 202210281823
ISSUE DATE / TIME: 202210281823
ISSUE DATE / TIME: 202210290215
ISSUE DATE / TIME: 202210291057
ISSUE DATE / TIME: 202210291349
ISSUE DATE / TIME: 202210292143
ISSUE DATE / TIME: 202210292143
Unit Type and Rh: 6200
Unit Type and Rh: 6200
Unit Type and Rh: 6200
Unit Type and Rh: 6200
Unit Type and Rh: 6200
Unit Type and Rh: 6200
Unit Type and Rh: 6200
Unit Type and Rh: 6200

## 2021-01-13 LAB — TYPE AND SCREEN
ABO/RH(D): A POS
Antibody Screen: NEGATIVE
Unit division: 0
Unit division: 0
Unit division: 0
Unit division: 0
Unit division: 0
Unit division: 0
Unit division: 0
Unit division: 0

## 2021-01-13 MED ORDER — FUROSEMIDE 10 MG/ML IJ SOLN
40.0000 mg | Freq: Once | INTRAMUSCULAR | Status: AC
  Administered 2021-01-13: 40 mg via INTRAVENOUS
  Filled 2021-01-13: qty 4

## 2021-01-13 MED ORDER — POTASSIUM CHLORIDE CRYS ER 20 MEQ PO TBCR
40.0000 meq | EXTENDED_RELEASE_TABLET | Freq: Every day | ORAL | Status: DC
  Administered 2021-01-13 – 2021-01-14 (×2): 40 meq via ORAL
  Filled 2021-01-13 (×2): qty 2

## 2021-01-13 MED ORDER — FUROSEMIDE 40 MG PO TABS
40.0000 mg | ORAL_TABLET | Freq: Every day | ORAL | Status: DC
  Administered 2021-01-13 – 2021-01-17 (×5): 40 mg via ORAL
  Filled 2021-01-13 (×5): qty 1

## 2021-01-13 NOTE — Progress Notes (Signed)
Mobility Specialist: Progress Note   01/13/21 1543  Mobility  Activity Ambulated in hall  Level of Assistance Minimal assist, patient does 75% or more  Assistive Device Four wheel walker (EVA)  Distance Ambulated (ft) 420 ft  Mobility Ambulated with assistance in hallway  Mobility Response Tolerated well  Mobility performed by Mobility specialist  $Mobility charge 1 Mobility   Pre-Mobility: 89 HR, 94% SpO2 Post-Mobility: 87 HR  Pt to BR and then agreeable to ambulation. Pt required minA to stand from chair and toilet, BM successful. Pt still sleepy throughout ambulation and stopped x1 d/t feeling a little SOB. Pt to bed after walk per request with call bell and phone in the bed. Family member present in the room.   [facility] Cambree Hendrix Mobility Specialist Mobility Specialist Phone: [phone]

## 2021-01-13 NOTE — Progress Notes (Signed)
Pt arrived to 4E from [place]. CHG bath done. Telemetry applied and CCMD called. Pt oriented to room and call light in reach  Raelyn Number, RN

## 2021-01-13 NOTE — Progress Notes (Signed)
      [address]       [place] [address]             [phone]                 5 Days Post-Op Procedure(s) (LRB): Repair of Acute Ascending Thoracic Aortic Dissection Using Hemashield Platinum Graft Size 28MM (N/A) TRANSESOPHAGEAL ECHOCARDIOGRAM (TEE) (N/A)   Events: No events Ready for a work-out _______________________________________________________________ Vitals: BP 102/65   Pulse 85   Temp 98.9 F (37.2 C) (Oral)   Resp 15   Ht 5\' 10"  (1.778 m)   Wt 103.5 kg   LMP 05/31/2012   SpO2 92%   BMI 32.74 kg/m  Filed Weights   01/11/21 0500 01/12/21 0500 01/13/21 0500  Weight: 101.8 kg 105.5 kg 103.5 kg     - Neuro: alert NAD  - Cardiovascular: sinus  Drips: none     - Pulm:  EWOB ABG    Component Value Date/Time   PHART 7.341 (L) 01/09/2021 1243   PCO2ART 45.7 01/09/2021 1243   PO2ART 70 (L) 01/09/2021 1243   HCO3 24.7 01/09/2021 1243   TCO2 26 01/09/2021 1243   ACIDBASEDEF 1.0 01/09/2021 1243   O2SAT 92.0 01/09/2021 1243    - Abd: soft, ND - Extremity: warm  .Intake/Output      11/01 0701 11/02 0700 11/02 0701 11/03 0700   P.O. 175    I.V. (mL/kg)     Total Intake(mL/kg) 175 (1.7)    Urine (mL/kg/hr) 1000 (0.4)    Stool 0    Chest Tube 300    Total Output 1300    Net -1125         Urine Occurrence 4 x    Stool Occurrence 2 x       _______________________________________________________________ Labs: CBC Latest Ref Rng & Units 01/13/2021 01/12/2021 01/11/2021  WBC 4.0 - 10.5 K/uL 13.0(H) 14.5(H) 16.2(H)  Hemoglobin 12.0 - 15.0 g/dL 7.8(L) 7.5(L) 8.1(L)  Hematocrit 36.0 - 46.0 % 24.5(L) 24.1(L) 25.1(L)  Platelets 150 - 400 K/uL 204 143(L) 120(L)   CMP Latest Ref Rng & Units 01/12/2021 01/11/2021 01/10/2021  Glucose 70 - 99 mg/dL 102(H) 99 107(H)  BUN 6 - 20 mg/dL 20 22(H) 23(H)  Creatinine 0.44 - 1.00 mg/dL 1.17(H) 1.42(H) 1.66(H)  Sodium 135 - 145 mmol/L 136 138 139  Potassium 3.5 - 5.1 mmol/L 4.1 4.3 3.7   Chloride 98 - 111 mmol/L 106 107 107  CO2 22 - 32 mmol/L 27 27 26   Calcium 8.9 - 10.3 mg/dL 7.7(L) 7.8(L) 7.8(L)  Total Protein 6.5 - 8.1 g/dL - - -  Total Bilirubin 0.3 - 1.2 mg/dL - - -  Alkaline Phos 38 - 126 U/L - - -  AST 15 - 41 U/L - - -  ALT 0 - 44 U/L - - -    CXR: -  _______________________________________________________________  Assessment and Plan: POD 4 s/p type A dissection repair  Neuro: pain controlled CV: stable BP.  Goal SBP 100-130s.  We will start ACE or ARB once blood pressure is a little bit higher. Pulm: aggressive pulm toilet Renal: good uop.  Creat stable.  Aggressive diuresis today.  Up 18lbs GI: on diet Heme: hgb down, will watch for now ID: afebrile Endo: SSI  Dispo: awaiting floor bed   River Mckercher O Estill Llerena 01/13/2021 7:11 AM

## 2021-01-14 ENCOUNTER — Inpatient Hospital Stay (HOSPITAL_COMMUNITY): Payer: BC Managed Care – PPO

## 2021-01-14 DIAGNOSIS — R609 Edema, unspecified: Secondary | ICD-10-CM

## 2021-01-14 DIAGNOSIS — M7989 Other specified soft tissue disorders: Secondary | ICD-10-CM

## 2021-01-14 LAB — CBC
HCT: 24.4 % — ABNORMAL LOW (ref 36.0–46.0)
Hemoglobin: 8.1 g/dL — ABNORMAL LOW (ref 12.0–15.0)
MCH: 27.9 pg (ref 26.0–34.0)
MCHC: 33.2 g/dL (ref 30.0–36.0)
MCV: 84.1 fL (ref 80.0–100.0)
Platelets: 249 10*3/uL (ref 150–400)
RBC: 2.9 MIL/uL — ABNORMAL LOW (ref 3.87–5.11)
RDW: 17.7 % — ABNORMAL HIGH (ref 11.5–15.5)
WBC: 12.6 10*3/uL — ABNORMAL HIGH (ref 4.0–10.5)
nRBC: 2.2 % — ABNORMAL HIGH (ref 0.0–0.2)

## 2021-01-14 MED ORDER — METHOCARBAMOL 500 MG PO TABS
500.0000 mg | ORAL_TABLET | Freq: Three times a day (TID) | ORAL | Status: AC
  Administered 2021-01-14 – 2021-01-16 (×9): 500 mg via ORAL
  Filled 2021-01-14 (×9): qty 1

## 2021-01-14 MED ORDER — METOPROLOL SUCCINATE ER 25 MG PO TB24
25.0000 mg | ORAL_TABLET | Freq: Every day | ORAL | Status: DC
  Administered 2021-01-14 – 2021-01-17 (×4): 25 mg via ORAL
  Filled 2021-01-14 (×4): qty 1

## 2021-01-14 NOTE — Progress Notes (Signed)
Mobility Specialist: Progress Note   01/14/21 1105  Mobility  Activity Ambulated in hall  Level of Assistance Modified independent, requires aide device or extra time  Assistive Device Front wheel walker  Distance Ambulated (ft) 410 ft  Mobility Ambulated with assistance in hallway  Mobility Response Tolerated well  Mobility performed by Mobility specialist  $Mobility charge 1 Mobility   Pre-Mobility: 88 HR, 107/90 BP, 97% SpO2 Post-Mobility: 93 HR  Pt lethargic this morning but agreeable to ambulation. Pt independent to sit EOB and mod I to stand from EOB with bed height slightly elevated. Pt c/o 7/10 chest pain during ambulation, otherwise no c/o. Pt sitting EOB after walk with family member present in the room and nursing student present in the room.   [facility] Lum Stillinger Mobility Specialist Mobility Specialist Phone: [phone]

## 2021-01-14 NOTE — Progress Notes (Signed)
Pulled pts 3 chest tubes via order. Tubes intact. VSS. Pt eudcated and tolerated well. Pt placed on bedrest for 30 minutes.   Raelyn Number, RN

## 2021-01-14 NOTE — Progress Notes (Signed)
CARDIAC REHAB PHASE I   PRE:  Rate/Rhythm: 86 SR  BP:  Sitting: 123/75      SaO2: 94 RA  MODE:  Ambulation: 370 ft   POST:  Rate/Rhythm: 107 ST  BP:  Sitting: 152/71    SaO2: 97 RA   Pt assisted to BR than ambulated 396ft in hallway standby assist with front wheel walker. Pt states some SOB but denies CP or dizziness. Pt returned to recliner. Began some education. Pt requesting walker and 3-in-1 for home. Encouraged continued ambulation and IS use. Will continue to follow.  [phone] Rufina Falco, RN BSN 01/14/2021 3:15 PM

## 2021-01-14 NOTE — Progress Notes (Signed)
Progress Note  Patient Name: Catherine Mcdonald Date of Encounter: 01/14/2021  [facility] Cardiologist: Pixie Casino, MD   Subjective   Today is the patient's birthday, surrounded by family on exam. Chest tubes about to be pulled. Blood pressure has been stable to low. Has hand swelling, awaiting ultrasound.  Inpatient Medications    Scheduled Meds:  sodium chloride   Intravenous Once   sodium chloride   Intravenous Once   acetaminophen  1,000 mg Oral Q6H   Or   acetaminophen (TYLENOL) oral liquid 160 mg/5 mL  1,000 mg Per Tube Q6H   aspirin EC  325 mg Oral Daily   Or   aspirin  324 mg Per Tube Daily   atorvastatin  80 mg Oral Daily   bisacodyl  10 mg Oral Daily   Or   bisacodyl  10 mg Rectal Daily   Chlorhexidine Gluconate Cloth  6 each Topical Daily   docusate sodium  200 mg Oral Daily   enoxaparin (LOVENOX) injection  40 mg Subcutaneous QHS   furosemide  40 mg Oral Daily   mouth rinse  15 mL Mouth Rinse BID   methocarbamol  500 mg Oral TID   metoprolol succinate  25 mg Oral Daily   potassium chloride  40 mEq Oral Daily   sodium chloride flush  10-40 mL Intracatheter Q12H   sodium chloride flush  3 mL Intravenous Q12H   sodium chloride flush  3 mL Intravenous Q12H   sodium chloride flush  3 mL Intravenous Q12H   Continuous Infusions:  sodium chloride Stopped (01/09/21 1100)   sodium chloride Stopped (01/10/21 0812)   sodium chloride 500 mL (01/10/21 2117)   sodium chloride     sodium chloride Stopped (01/09/21 1100)   sodium chloride     lactated ringers     lactated ringers Stopped (01/11/21 1016)   lactated ringers Stopped (01/09/21 1606)   PRN Meds: sodium chloride, sodium chloride, lactated ringers, morphine injection, ondansetron (ZOFRAN) IV, oxyCODONE, sodium chloride flush, sodium chloride flush, sodium chloride flush, traMADol   Vital Signs    Vitals:   01/14/21 0902 01/14/21 1200 01/14/21 1202 01/14/21 1204  BP: 116/70 (!) 156/82 (!) 144/75  139/79  Pulse: 82     Resp: 18 18 20 18   Temp: 98.7 F (37.1 C)     TempSrc: Oral     SpO2: 94% 93% 95% 93%  Weight:      Height:        Intake/Output Summary (Last 24 hours) at 01/14/2021 1234 Last data filed at 01/14/2021 1200 Gross per 24 hour  Intake 480 ml  Output 90 ml  Net 390 ml   Last 3 Weights 01/14/2021 01/13/2021 01/12/2021  Weight (lbs) 225 lb 5 oz 228 lb 2.8 oz 232 lb 9.4 oz  Weight (kg) 102.2 kg 103.5 kg 105.5 kg      Telemetry    NSR - Personally Reviewed  ECG    10/29 NSR, TWI, no prior comparisons before current admission - Personally Reviewed  Physical Exam   GEN: Well nourished, well developed in no acute distress NECK: No JVD CARDIAC: regular rhythm, normal S1 and S2, no rubs or gallops. No murmur. VASCULAR: Radial pulses 2+ bilaterally.  RESPIRATORY:  Clear to auscultation without rales, wheezing or rhonchi  ABDOMEN: Soft, non-tender, non-distended MUSCULOSKELETAL:  Moves all 4 limbs independently SKIN: Warm and dry, edema in left hand NEUROLOGIC:  No focal neuro deficits noted. PSYCHIATRIC:  Normal affect  Labs    High Sensitivity Troponin:   Recent Labs  Lab 01/08/21 1416 01/08/21 1616  TROPONINIHS 8 10     Chemistry Recent Labs  Lab 01/08/21 1427 01/08/21 1638 01/09/21 0134 01/09/21 0358 01/09/21 1533 01/10/21 0405 01/10/21 1411 01/11/21 0400 01/12/21 0049  NA 139   < > 138   < > 144   < > 139 138 136  K 3.5   < > 4.0   < > 3.8   < > 3.7 4.3 4.1  CL 105   < > 109   < > 112*   < > 107 107 106  CO2 26  --  23   < > 24   < > 26 27 27   GLUCOSE 100*   < > 126*   < > 100*   < > 107* 99 102*  BUN 9   < > 14   < > 20   < > 23* 22* 20  CREATININE 1.35*   < > 1.54*   < > 1.90*   < > 1.66* 1.42* 1.17*  CALCIUM 8.6*  --  6.9*   < > 7.6*   < > 7.8* 7.8* 7.7*  MG  --   --  2.4  --  3.0*  --  2.9*  --   --   PROT 5.8*  --   --   --   --   --   --   --   --   ALBUMIN 3.5  --   --   --   --   --   --   --   --   AST 18  --   --   --    --   --   --   --   --   ALT 14  --   --   --   --   --   --   --   --   ALKPHOS 60  --   --   --   --   --   --   --   --   BILITOT 0.9  --   --   --   --   --   --   --   --   GFRNONAA 48*  --  41*   < > 32*   < > 37* 45* 56*  ANIONGAP 8  --  6   < > 8   < > 6 4* 3*   < > = values in this interval not displayed.    Lipids  Recent Labs  Lab 01/08/21 1435  CHOL 253*  TRIG 48  HDL 77  LDLCALC 166*  CHOLHDL 3.3    Hematology Recent Labs  Lab 01/12/21 0049 01/13/21 0122 01/14/21 0138  WBC 14.5* 13.0* 12.6*  RBC 2.81* 2.85* 2.90*  HGB 7.5* 7.8* 8.1*  HCT 24.1* 24.5* 24.4*  MCV 85.8 86.0 84.1  MCH 26.7 27.4 27.9  MCHC 31.1 31.8 33.2  RDW 17.6* 17.7* 17.7*  PLT 143* 204 249   Thyroid No results for input(s): TSH, FREET4 in the last 168 hours.  BNP Recent Labs  Lab 01/08/21 1427  BNP 34.3    DDimer No results for input(s): DDIMER in the last 168 hours.   Radiology    No results found.  Cardiac Studies   Echo op TEE 01/08/21 POST-OP IMPRESSIONS  _ Left Ventricle: The left ventricle is unchanged from pre-bypass. has  normal  systolic function, with an ejection fraction of 60%. The cavity size was  normal.  The wall motion is normal.  _ Right Ventricle: The right ventricle appears unchanged from pre-bypass.  _ Aorta: A graft was placed in the ascending aorta for repair.Type A  dissection  repair with hemiarch.  _ Aortic Valve: There is no regurgitation.  _ Mitral Valve: The mitral valve appears unchanged from pre-bypass.  _ Tricuspid Valve: The tricuspid valve appears unchanged from pre-bypass.  _ Pulmonic Valve: The pulmonic valve appears unchanged from pre-bypass.  _ Interatrial Septum: The interatrial septum appears unchanged from  pre-bypass.  _ Pericardium: The pericardium appears unchanged from pre-bypass.  _ Comments: Post-bypass images reviewed with surgeon.   PRE-OP FINDINGS   Left Ventricle: The left ventricle has normal systolic function, with an   ejection fraction of 60-65%. The cavity size was normal. There is moderate  concentric left ventricular hypertrophy.    Right Ventricle: The right ventricle has normal systolic function. The  cavity was normal. There is increased right ventricular wall thickness.   Left Atrium: Left atrial size was normal in size. No left atrial/left  atrial appendage thrombus was detected. Left atrial appendage velocity is  normal at greater than 40 cm/s.   Right Atrium: Right atrial size was normal in size.   Interatrial Septum: No atrial level shunt detected by color flow Doppler.   Pericardium: There is no evidence of pericardial effusion.   Mitral Valve: The mitral valve is normal in structure. Mitral valve  regurgitation is trivial by color flow Doppler.   Tricuspid Valve: The tricuspid valve was normal in structure. Tricuspid  valve regurgitation was not visualized by color flow Doppler.   Aortic Valve: The aortic valve is tricuspid Aortic valve regurgitation is  severe by color flow Doppler. There is no stenosis of the aortic valve.    Pulmonic Valve: The pulmonic valve was normal in structure.  Pulmonic valve regurgitation is not visualized by color flow Doppler.    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  There is evidence of a dissection in the aortic root, ascending aorta and  aortic arch. The dissection can be classified as a Stanford type A  (proximal). The dissection entry point   is at the aortic root. A false lumen can be seen that contains thrombus.   Patient Profile     50 y.o. female with PMH hypertension, presenting with chest pain and found to have type A aortic dissection.  Assessment & Plan    Type A aortic dissection -s/p OR 01/08/21 -continue aspirin, atorvastatin -beta blocker as below  Hypertension -tolerating metoprolol tartrate 12.5 mg BID -> consolidating to metoprolol succinate 25 mg daily -blood pressures remain  borderline low, no room to uptitrate or add ARB at this time  Acute kidney injury -improving, Cr 1.17. Unknown baseline, was 1.35 on admission, nadir 1.10, peak 1.90  Post op anemia Thrombocytopenia -platelets normalized, Hgb stabilized  [facility] will sign off.   Medication Recommendations:   Aspirin (dose per primary team) Atorvastatin 80 mg daily Metoprolol succinate 25 mg daily Other recommendations (labs, testing, etc):  none Follow up as an outpatient:  Patient has a follow up appt with Sande Rives at the [facility] office on 01/29/21   For questions or updates, please contact [place] Please consult [email or web address] for contact info under        Signed, Buford Dresser, MD  01/14/2021, 12:34 PM

## 2021-01-14 NOTE — Plan of Care (Signed)
  Problem: Clinical Measurements: Goal: Will remain free from infection Outcome: Progressing Goal: Cardiovascular complication will be avoided Outcome: Progressing   Problem: Activity: Goal: Risk for activity intolerance will decrease Outcome: Progressing   

## 2021-01-14 NOTE — Progress Notes (Signed)
Upper extremity venous has been completed.   Preliminary results in CV Proc.   Catherine Mcdonald 01/14/2021 1:44 PM

## 2021-01-14 NOTE — Progress Notes (Addendum)
      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 Using Hemashield Platinum Graft Size 28MM (N/A) TRANSESOPHAGEAL ECHOCARDIOGRAM (TEE) (N/A)  Subjective: Patient has swelling/pain in left hand  Objective: Vital signs in last 24 hours: Temp:  [98.4 F (36.9 C)-99.2 F (37.3 C)] 98.4 F (36.9 C) (11/03 0405) Pulse Rate:  [84-97] 88 (11/03 0405) Cardiac Rhythm: Normal sinus rhythm (11/02 1900) Resp:  [16-32] 20 (11/03 0405) BP: (91-127)/(52-71) 127/70 (11/03 0405) SpO2:  [93 %-96 %] 94 % (11/03 0405) Weight:  [102.2 kg] 102.2 kg (11/03 0353)  Pre op weight  95.3 kg Current Weight  01/14/21 102.2 kg      Intake/Output from previous day: 11/02 0701 - 11/03 0700 In: 67 [P.O.:570] Out: 1250 [Urine:1250]   Physical Exam:  Cardiovascular: RRR, no murmur Pulmonary: Slightly diminished bibasilar breath sounds Abdomen: Soft, non tender, bowel sounds present. Extremities: Trace  bilateral lower extremity edema. Left hand/arm with swelling. Pulses, motor/sensory intact Wounds: Right axillary and sternal wounds are clean and dry.  No erythema or signs of infection.  Lab Results: CBC: Recent Labs    01/13/21 0122 01/14/21 0138  WBC 13.0* 12.6*  HGB 7.8* 8.1*  HCT 24.5* 24.4*  PLT 204 249   BMET:  Recent Labs    01/12/21 0049  NA 136  K 4.1  CL 106  CO2 27  GLUCOSE 102*  BUN 20  CREATININE 1.17*  CALCIUM 7.7*    PT/INR:  Lab Results  Component Value Date   INR 1.0 01/09/2021   INR 1.7 (H) 01/09/2021   INR 1.1 01/08/2021   ABG:  INR: Will add last result for INR, ABG once components are confirmed Will add last 4 CBG results once components are confirmed  Assessment/Plan:  1. CV - SR with HR in the 80's. SBP low 100's. On Lopressor 12.5 mg bid. 2.  Pulmonary - On room air. Chest tube with 56 cc last 24 hours. Remove  chest tubes. Check PA/LAT CXR in am. Encourage incentive spirometer 3. Volume Overload - On Lasix 40 mg daily 4.  Expected post op acute blood loss anemia - H and H stable at 8.1 and 24.4 5. Left hand and LUE swelling-check duplex to rule out DVT  Donielle M ZimmermanPA-C 01/14/2021,7:09 AM  No events Aggressive ambulation today Home tomorrow  Lajuana Matte

## 2021-01-15 ENCOUNTER — Inpatient Hospital Stay (HOSPITAL_COMMUNITY): Payer: BC Managed Care – PPO

## 2021-01-15 LAB — CBC
HCT: 24.2 % — ABNORMAL LOW (ref 36.0–46.0)
Hemoglobin: 7.7 g/dL — ABNORMAL LOW (ref 12.0–15.0)
MCH: 27.1 pg (ref 26.0–34.0)
MCHC: 31.8 g/dL (ref 30.0–36.0)
MCV: 85.2 fL (ref 80.0–100.0)
Platelets: 321 10*3/uL (ref 150–400)
RBC: 2.84 MIL/uL — ABNORMAL LOW (ref 3.87–5.11)
RDW: 18.1 % — ABNORMAL HIGH (ref 11.5–15.5)
WBC: 12.6 10*3/uL — ABNORMAL HIGH (ref 4.0–10.5)
nRBC: 1.8 % — ABNORMAL HIGH (ref 0.0–0.2)

## 2021-01-15 LAB — BASIC METABOLIC PANEL
Anion gap: 6 (ref 5–15)
BUN: 11 mg/dL (ref 6–20)
CO2: 28 mmol/L (ref 22–32)
Calcium: 8.1 mg/dL — ABNORMAL LOW (ref 8.9–10.3)
Chloride: 104 mmol/L (ref 98–111)
Creatinine, Ser: 0.96 mg/dL (ref 0.44–1.00)
GFR, Estimated: >60 mL/min (ref >60)
Glucose, Bld: 105 mg/dL — ABNORMAL HIGH (ref 70–99)
Potassium: 3.8 mmol/L (ref 3.5–5.1)
Sodium: 138 mmol/L (ref 135–145)

## 2021-01-15 MED ORDER — FUROSEMIDE 40 MG PO TABS: 40.0000 mg | ORAL_TABLET | Freq: Every day | ORAL | 0 refills | Status: DC

## 2021-01-15 MED ORDER — TRAMADOL HCL 50 MG PO TABS: 50.0000 mg | ORAL_TABLET | Freq: Four times a day (QID) | ORAL | 0 refills | Status: DC | PRN

## 2021-01-15 MED ORDER — FUROSEMIDE 40 MG PO TABS
40.0000 mg | ORAL_TABLET | Freq: Every day | ORAL | Status: DC
Start: 2021-01-15 — End: 2021-01-15

## 2021-01-15 MED ORDER — POTASSIUM CHLORIDE CRYS ER 20 MEQ PO TBCR
30.0000 meq | EXTENDED_RELEASE_TABLET | Freq: Two times a day (BID) | ORAL | Status: AC
  Administered 2021-01-15 (×2): 30 meq via ORAL
  Filled 2021-01-15 (×2): qty 1

## 2021-01-15 MED ORDER — POTASSIUM CHLORIDE CRYS ER 20 MEQ PO TBCR: 30.0000 meq | EXTENDED_RELEASE_TABLET | Freq: Every day | ORAL | 0 refills | Status: DC

## 2021-01-15 MED ORDER — ASPIRIN 325 MG PO TBEC: 325.0000 mg | DELAYED_RELEASE_TABLET | Freq: Every day | ORAL | 0 refills | Status: DC

## 2021-01-15 MED ORDER — METHOCARBAMOL 500 MG PO TABS: 500.0000 mg | ORAL_TABLET | Freq: Three times a day (TID) | ORAL | 0 refills | Status: DC | PRN

## 2021-01-15 MED ORDER — METOPROLOL SUCCINATE ER 25 MG PO TB24
25.0000 mg | ORAL_TABLET | Freq: Every day | ORAL | 1 refills | Status: DC
Start: 2021-01-15 — End: 2021-01-25

## 2021-01-15 MED ORDER — ATORVASTATIN CALCIUM 80 MG PO TABS: 80.0000 mg | ORAL_TABLET | Freq: Every day | ORAL | 1 refills | Status: DC

## 2021-01-15 MED ORDER — FUROSEMIDE 40 MG PO TABS
40.0000 mg | ORAL_TABLET | Freq: Every day | ORAL | Status: AC
  Administered 2021-01-15: 40 mg via ORAL
  Filled 2021-01-15: qty 1

## 2021-01-15 MED FILL — Sodium Bicarbonate IV Soln 8.4%: INTRAVENOUS | Qty: 150 | Status: AC

## 2021-01-15 MED FILL — Mannitol IV Soln 20%: INTRAVENOUS | Qty: 500 | Status: AC

## 2021-01-15 MED FILL — Heparin Sodium (Porcine) Inj 1000 Unit/ML: INTRAMUSCULAR | Qty: 10 | Status: AC

## 2021-01-15 MED FILL — Sodium Chloride IV Soln 0.9%: INTRAVENOUS | Qty: 3000 | Status: AC

## 2021-01-15 MED FILL — Calcium Chloride Inj 10%: INTRAVENOUS | Qty: 10 | Status: AC

## 2021-01-15 MED FILL — Electrolyte-R (PH 7.4) Solution: INTRAVENOUS | Qty: 5000 | Status: AC

## 2021-01-15 NOTE — TOC Progression Note (Signed)
Transition of Care [facility]) - Progression Note    Patient Details  Name: Catherine Mcdonald MRN: [id number] Date of Birth: Feb 15, 1971  Transition of Care [facility]) CM/SW [place], [place] Phone Number: 01/15/2021, 2:32 PM  Clinical Narrative:     CSW provided family with medicaid application as requested.   Thurmond Butts, MSW, LCSW Clinical Social Worker    Expected Discharge Plan: Home/Self Care Barriers to Discharge: Continued Medical Work up  Expected Discharge Plan and Services Expected Discharge Plan: Home/Self Care   Discharge Planning Services: CM Consult Post Acute Care Choice: Durable Medical Equipment                   DME Arranged: 3-N-1, Walker rolling DME Agency: [employer] Date DME Agency Contacted: 01/15/21 Time DME Agency Contacted: [phone] Representative spoke with at DME Agency: [place] ([place]) Interventions    Readmission Risk Interventions No flowsheet data found.

## 2021-01-15 NOTE — Progress Notes (Signed)
Mobility Specialist: Progress Note   01/15/21 1054  Mobility  Activity Ambulated in hall  Level of Assistance Standby assist, set-up cues, supervision of patient - no hands on  Assistive Device Front wheel walker  Distance Ambulated (ft) 410 ft  Mobility Ambulated with assistance in hallway  Mobility Response Tolerated well  Mobility performed by Mobility specialist  Bed Position Chair  $Mobility charge 1 Mobility   During Mobility: 107 HR Post-Mobility: 93 HR, 151/82 BP  Pt having coughing spell upon entering room requiring her to rest a minute before getting up. Pt took two attempts to stand but was able to power up on her own on second attempt. Pt c/o 5/10 pain during ambulation, otherwise asymptomatic. Pt to recliner after walk with call bell in reach and family member present in the room.   [facility] Mobility Specialist Mobility Specialist Phone: [phone]

## 2021-01-15 NOTE — Progress Notes (Signed)
CARDIAC REHAB PHASE I   Offered to walk with pt. Pt recently ambulated independently with family. Pt denies CP, SOB, or dizziness. Encouraged continued IS use and ambulation. Pt hopeful for d/c tomorrow.  [id number] Rufina Falco, RN BSN 01/15/2021 2:53 PM

## 2021-01-15 NOTE — Social Work (Signed)
Family completed medicaid application- application was placed in 4E out going mailbox to be mailed.  Thurmond Butts, MSW, LCSW Clinical Social Worker

## 2021-01-15 NOTE — Progress Notes (Addendum)
      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 Using Hemashield Platinum Graft Size 28MM (N/A) TRANSESOPHAGEAL ECHOCARDIOGRAM (TEE) (N/A)  Subjective: Patient states she is tired  Objective: Vital signs in last 24 hours: Temp:  [98 F (36.7 C)-99.3 F (37.4 C)] 98.2 F (36.8 C) (11/04 0355) Pulse Rate:  [80-87] 80 (11/04 0355) Cardiac Rhythm: Normal sinus rhythm (11/03 2100) Resp:  [16-20] 20 (11/04 0355) BP: (102-156)/(53-82) 142/81 (11/04 0355) SpO2:  [90 %-98 %] 90 % (11/04 0355) Weight:  [102 kg] 102 kg (11/04 0355)  Pre op weight  95.3 kg Current Weight  01/15/21 102 kg      Intake/Output from previous day: 11/03 0701 - 11/04 0700 In: 0  Out: 34 [Chest Tube:34]   Physical Exam:  Cardiovascular: RRR, no murmur Pulmonary: Slightly diminished bibasilar breath sounds Abdomen: Soft, non tender, bowel sounds present. Extremities: Trace  bilateral lower extremity edema. Left hand/arm with swelling but decreased from previous day. Pulses, motor/sensory intact Wounds: Right axillary and sternal wounds are clean and dry.  No erythema or signs of infection.  Lab Results: CBC: Recent Labs    01/14/21 0138 01/15/21 0159  WBC 12.6* 12.6*  HGB 8.1* 7.7*  HCT 24.4* 24.2*  PLT 249 321    BMET:  Recent Labs    01/15/21 0159  NA 138  K 3.8  CL 104  CO2 28  GLUCOSE 105*  BUN 11  CREATININE 0.96  CALCIUM 8.1*     PT/INR:  Lab Results  Component Value Date   INR 1.0 01/09/2021   INR 1.7 (H) 01/09/2021   INR 1.1 01/08/2021   ABG:  INR: Will add last result for INR, ABG once components are confirmed Will add last 4 CBG results once components are confirmed  Assessment/Plan:  1. CV - SR with HR in the 80's. On Toprol XL 25 mg daily. Will consider starting ACE/ARB after discharge. 2.  Pulmonary - On room air.  PA/LAT  CXR this am appears to show (?small left alteral pneumothorax, bibasilar atelectasis, small pleural effusions). Encourage incentive spirometer 3. Volume Overload - On Lasix 40 mg daily but will give bid today 4.  Expected post op acute blood loss anemia - H and H slightly decreased to 7.7 and 24.2 5. Left hand and LUE swelling-Duplex of UE done yesterday showed no DVT, superficial vein thrombosis left cephalic vein. Keep left hand/arm elevated 6. Creatinine decreased to 0.96 7. Will possibly discharge home later today or in am;3 in 1 arranged  Donielle M ZimmermanPA-C 01/15/2021,6:55 AM  Agree with above. [place]

## 2021-01-16 ENCOUNTER — Inpatient Hospital Stay (HOSPITAL_COMMUNITY): Payer: BC Managed Care – PPO

## 2021-01-16 ENCOUNTER — Other Ambulatory Visit: Payer: Self-pay | Admitting: Physician Assistant

## 2021-01-16 LAB — CBC
HCT: 29 % — ABNORMAL LOW (ref 36.0–46.0)
Hemoglobin: 8.9 g/dL — ABNORMAL LOW (ref 12.0–15.0)
MCH: 26.5 pg (ref 26.0–34.0)
MCHC: 30.7 g/dL (ref 30.0–36.0)
MCV: 86.3 fL (ref 80.0–100.0)
Platelets: 438 10*3/uL — ABNORMAL HIGH (ref 150–400)
RBC: 3.36 MIL/uL — ABNORMAL LOW (ref 3.87–5.11)
RDW: 18.7 % — ABNORMAL HIGH (ref 11.5–15.5)
WBC: 16.2 10*3/uL — ABNORMAL HIGH (ref 4.0–10.5)
nRBC: 1.5 % — ABNORMAL HIGH (ref 0.0–0.2)

## 2021-01-16 LAB — URINALYSIS, ROUTINE W REFLEX MICROSCOPIC
Bacteria, UA: NONE SEEN
Bilirubin Urine: NEGATIVE
Glucose, UA: NEGATIVE mg/dL
Ketones, ur: NEGATIVE mg/dL
Leukocytes,Ua: NEGATIVE
Nitrite: NEGATIVE
Protein, ur: 30 mg/dL — AB
Specific Gravity, Urine: 1.02 (ref 1.005–1.030)
pH: 5 (ref 5.0–8.0)

## 2021-01-16 MED ORDER — LOSARTAN POTASSIUM 25 MG PO TABS
25.0000 mg | ORAL_TABLET | Freq: Every day | ORAL | Status: DC
  Administered 2021-01-16 – 2021-01-17 (×2): 25 mg via ORAL
  Filled 2021-01-16 (×2): qty 1

## 2021-01-16 MED ORDER — LOSARTAN POTASSIUM 25 MG PO TABS: 25.0000 mg | ORAL_TABLET | Freq: Every day | ORAL | 2 refills | Status: DC

## 2021-01-16 NOTE — Progress Notes (Addendum)
      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 BP 169/67 after the walk.   O2sats OK on RA.  Having a dry cough.   Objective: Vital signs in last 24 hours: Temp:  [97.9 F (36.6 C)-98.8 F (37.1 C)] 98.6 F (37 C) (11/05 0741) Pulse Rate:  [83-90] 90 (11/05 0741) Cardiac Rhythm: Normal sinus rhythm (11/05 0750) Resp:  [15-24] 20 (11/05 0741) BP: (102-156)/(75-85) 102/76 (11/05 0741) SpO2:  [92 %-97 %] 93 % (11/05 0741)    Intake/Output from previous day: 11/04 0701 - 11/05 0700 In: 3 [I.V.:3] Out: -  Intake/Output this shift: No intake/output data recorded.  General appearance: alert, cooperative, and no distress Neurologic: intact Heart: regular rhythm, monitory showing sinus tach. Lungs: clear to auscultation bilaterally Abdomen: soft but has some LUQ tenderness that is new.  Extremities: tract LE edema Wound: the sternotomy incision and right subclavian incisions are intact and dry.   Lab Results: Recent Labs    01/15/21 0159 01/16/21 0309  WBC 12.6* 16.2*  HGB 7.7* 8.9*  HCT 24.2* 29.0*  PLT 321 438*   BMET:  Recent Labs    01/15/21 0159  NA 138  K 3.8  CL 104  CO2 28  GLUCOSE 105*  BUN 11  CREATININE 0.96  CALCIUM 8.1*    PT/INR: No results for input(s): LABPROT, INR in the last 72 hours. ABG    Component Value Date/Time   PHART 7.341 (L) 01/09/2021 1243   HCO3 24.7 01/09/2021 1243   TCO2 26 01/09/2021 1243   ACIDBASEDEF 1.0 01/09/2021 1243   O2SAT 92.0 01/09/2021 1243   CBG (last 3)  No results for input(s): GLUCAP in the last 72 hours.  Assessment/Plan: S/P Procedure(s) (LRB): Repair of Acute Ascending  Thoracic Aortic Dissection Using Hemashield Platinum Graft Size 28MM (N/A) TRANSESOPHAGEAL ECHOCARDIOGRAM (TEE) (N/A)  -CV-POD8 repair of Type A aortic dissection with a straight graft. Steadily progressing over the past several days and planning for discharge today but not feeling as well today. BP elevated this morning, currently on Toprol XL 25mg  daily.   -HEME- Expected ABL anemia but Hct is trending up. New leukocytosis today, no GU sx, incisions all OK, left cephalic vein thrombosis is resolving .   -RENAL- renal function has been stable. Wt is still several pounds over pre-op if accurate. On Lasix 40mg  po daily.  -GI- tolerating diet and has had appropriate bowel function. Not sure what to make of her LUQ tenderness this morning. She thinks in may be due to chest binder rubbing her at this site.   -Plan-Not feeling as well in general but no specific complaints except vague LUQ tenderness. Has new leukocytosis and BP control is not optimal. She agrees to stay in the hospital another day. Will check a UA today and recheck the CBC in am.  Adding low-dose ARB for HTN.    LOS: 8 days    Antony Odea, PA-C [phone] 01/16/2021  Patient seen and examined, agree with above No obvious source for leukocytosis- check UA Possibly home tomorrow  Revonda Standard. Roxan Hockey, MD [facility] [phone]

## 2021-01-16 NOTE — Progress Notes (Signed)
CARDIAC REHAB PHASE I   PRE:  Rate/Rhythm: 85 SR  BP:  Sitting: 125/78      SaO2: 97 RA  MODE:  Ambulation: 360 ft   POST:  Rate/Rhythm: 110 ST  BP:  Sitting: 169/67    SaO2: 96 RA  Pt ambulated 371ft in hallway standby assist with front wheel walker. Pt c/o SOB, coached through purse lipped breathing. Pt denies CP or dizziness. D/c education completed with pt and spouse. Pt educated on importance of site care and monitoring incisions daily. Encouraged continued IS use, walks, and sternal precautions. Pt given in-the-tube sheet along with heart healthy diet. Reviewed restriction and exercise guidelines. DME at bedside.   [id number] Rufina Falco, RN BSN 01/16/2021 9:44 AM

## 2021-01-17 ENCOUNTER — Other Ambulatory Visit: Payer: Self-pay | Admitting: Physician Assistant

## 2021-01-17 LAB — CBC
HCT: 25.9 % — ABNORMAL LOW (ref 36.0–46.0)
Hemoglobin: 8 g/dL — ABNORMAL LOW (ref 12.0–15.0)
MCH: 26.5 pg (ref 26.0–34.0)
MCHC: 30.9 g/dL (ref 30.0–36.0)
MCV: 85.8 fL (ref 80.0–100.0)
Platelets: 377 10*3/uL (ref 150–400)
RBC: 3.02 MIL/uL — ABNORMAL LOW (ref 3.87–5.11)
RDW: 18.6 % — ABNORMAL HIGH (ref 11.5–15.5)
WBC: 16.1 10*3/uL — ABNORMAL HIGH (ref 4.0–10.5)
nRBC: 1.5 % — ABNORMAL HIGH (ref 0.0–0.2)

## 2021-01-17 LAB — BASIC METABOLIC PANEL
Anion gap: 8 (ref 5–15)
BUN: 9 mg/dL (ref 6–20)
CO2: 26 mmol/L (ref 22–32)
Calcium: 8.2 mg/dL — ABNORMAL LOW (ref 8.9–10.3)
Chloride: 104 mmol/L (ref 98–111)
Creatinine, Ser: 0.97 mg/dL (ref 0.44–1.00)
GFR, Estimated: >60 mL/min (ref >60)
Glucose, Bld: 116 mg/dL — ABNORMAL HIGH (ref 70–99)
Potassium: 4 mmol/L (ref 3.5–5.1)
Sodium: 138 mmol/L (ref 135–145)

## 2021-01-17 MED ORDER — GUAIFENESIN ER 600 MG PO TB12: 600.0000 mg | ORAL_TABLET | Freq: Two times a day (BID) | ORAL | Status: DC

## 2021-01-17 NOTE — Progress Notes (Signed)
Mobility Specialist Progress Note:   01/17/21 1100  Mobility  Activity Ambulated in hall  Level of Assistance Standby assist, set-up cues, supervision of patient - no hands on  Assistive Device Front wheel walker  Distance Ambulated (ft) 400 ft  Mobility Ambulated independently in hallway  Mobility Response Tolerated well  Mobility performed by Family member  $Mobility charge  --    Pt seen walking with family member.  [facility] Health and safety inspector Phone [phone]

## 2021-01-17 NOTE — Progress Notes (Addendum)
      [address]       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  No new complaints.  Abdominal discomfort improved.  Objective: Vital signs in last 24 hours: Temp:  [98.2 F (36.8 C)-98.8 F (37.1 C)] 98.7 F (37.1 C) (11/06 0343) Pulse Rate:  [82-95] 95 (11/06 0343) Cardiac Rhythm: Normal sinus rhythm (11/05 2006) Resp:  [18-20] 20 (11/06 0343) BP: (105-127)/(60-99) 115/99 (11/06 0343) SpO2:  [92 %-98 %] 98 % (11/06 0343)    Intake/Output from previous day: No intake/output data recorded. Intake/Output this shift: No intake/output data recorded.  General appearance: alert, cooperative, and no distress Neurologic: intact Heart: regular rhythm, monitory showing sinus tach. Lungs: clear to auscultation bilaterally Abdomen: soft, no tenderness this morning Extremities: no LE edema Wound: the sternotomy incision and right subclavian incisions are intact and dry.   Lab Results: Recent Labs    01/16/21 0309 01/17/21 0534  WBC 16.2* 16.1*  HGB 8.9* 8.0*  HCT 29.0* 25.9*  PLT 438* 377    BMET:  Recent Labs    01/15/21 0159 01/17/21 0534  NA 138 138  K 3.8 4.0  CL 104 104  CO2 28 26  GLUCOSE 105* 116*  BUN 11 9  CREATININE 0.96 0.97  CALCIUM 8.1* 8.2*     PT/INR: No results for input(s): LABPROT, INR in the last 72 hours. ABG    Component Value Date/Time   PHART 7.341 (L) 01/09/2021 1243   HCO3 24.7 01/09/2021 1243   TCO2 26 01/09/2021 1243   ACIDBASEDEF 1.0 01/09/2021 1243   O2SAT 92.0 01/09/2021 1243   CBG (last 3)  No results for input(s): GLUCAP in the last 72 hours.  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  Feels better today and would like to go home.  No fever.  BP and heart rate control are better.  We have arranged for follow-up with Dr. Skeet Latch for long-term management of her hypertension.  -HEME- Expected ABL anemia but Hct is stable.  WBC 16,000 and an unchanged, no GU sx, UA yesterday shows no evidence of UTI, incisions all OK, left cephalic vein thrombosis is resolving.    -PULM-Chest x-ray continues to show some basilar atelectasis and congestion.  We will add twice daily Mucinex at discharge and ask her to continue this for a week.  Continue using incentive spirometer at home.  -RENAL- renal function has been stable. Wt is still several pounds over pre-op if accurate. On Lasix 40mg  po daily.  -GI- tolerating diet and has had appropriate bowel function.  Left upper quadrant tenderness resolved.  -Plan-discharged home today.   LOS: 9 days    Malon Kindle [phone] 01/17/2021  Patient seen and examined, agree with above Home today  Revonda Standard. Roxan Hockey, MD [facility] [phone]

## 2021-01-18 ENCOUNTER — Other Ambulatory Visit: Payer: Self-pay | Admitting: *Deleted

## 2021-01-18 ENCOUNTER — Other Ambulatory Visit: Payer: Self-pay | Admitting: Physician Assistant

## 2021-01-18 DIAGNOSIS — Z736 Limitation of activities due to disability: Secondary | ICD-10-CM

## 2021-01-18 MED FILL — Magnesium Sulfate Inj 50%: INTRAMUSCULAR | Qty: 10 | Status: AC

## 2021-01-18 MED FILL — Potassium Chloride Inj 2 mEq/ML: INTRAVENOUS | Qty: 40 | Status: AC

## 2021-01-18 MED FILL — Heparin Sodium (Porcine) Inj 1000 Unit/ML: Qty: 1000 | Status: AC

## 2021-01-18 MED FILL — Heparin Sodium (Porcine) Inj 1000 Unit/ML: INTRAMUSCULAR | Qty: 5000 | Status: CN

## 2021-01-19 ENCOUNTER — Other Ambulatory Visit: Payer: Self-pay | Admitting: Physician Assistant

## 2021-01-19 NOTE — Progress Notes (Addendum)
Cardiology Office Note:    Date:  01/29/2021   ID:  Catherine Mcdonald, DOB 12/05/1970, MRN [id number]  PCP:  Catherine Peers, MD  Cardiologist:  Catherine Casino, MD  Electrophysiologist:  None   Referring MD: Catherine Peers, MD   Chief Complaint: hospital follow-up for aortic dissection  History of Present Illness:    Catherine Mcdonald is a 50 y.o. female with a history of thoracic aortic aneurysm with recent type I dissection on 01/08/2021 s/p repair with graft as well as aortic valve resuspension and replacement of the ascending aorta/arch, hypertension, hyperlipidemia, and febrile seizures as an infant who is followed by Dr. Debara Mcdonald and presents today for hospital follow-up after aortic dissection.  Was recently admitted from 01/08/2021 to 01/16/2021 with acute thoracic aortic dissection after presenting with acute onset of chest/left-sided neck pain.  She was initially ruled in for a non-STEMI with ST depressions in inferior leads.  However, when she was taken to the CT scanner, she was found to have a Type A dissection stenting from the aortic root through the diaphragmatic hiatus. Surgery was consulted consulted and she underwent emergent surgery with Dr. Kipp Mcdonald where she underwent aortic dissection repair with a 8 mm graft as well as aortic valve resuspension and replacement of the ascending aorta and arch.  She tolerated the procedure well. Post-op course was relatively unremarkable.  Patient had a telephone visit with Dr. Kipp Mcdonald on 01/22/2021 at which time she was reportedly doing well. Her BP was elevated at 160/90 so she was started on Lisinopril. Plan was to follow-up in 1 month with a chest x-ray.   Patient presents today for follow-up.  Here with her husband. Patient states she is getting better every day and is now able to walk without her walker. However, she was noticeably short of breath walking into the exam room and even has some mild labored breathing while  talking  with a mask on (improved after taking mask off). O2 sats 96% She states she has had this since leaving the hospital.  It is not getting any worse but is also not getting any better.  She has been sleeping on an incline and it sounds like this is due to to both orthopnea and comfort.  No significant PND.  She has also had some bilateral lower extremity edema since leaving the hospital as well as some abdominal fullness with early satiety.  She notes some just chest discomfort with coughing as well as a sharp pain over her right breast that only last for couple seconds and is reproducible with palpation on exam.  Nothing that sound like angina.  She notes some palpitations that she describes as fluttering that can last for up to 10 seconds at a time.  No significant lightheadedness, dizziness, syncope. She is not having any significant pain and is not longer taking Tramadol. She states she has been using incentive spirometry about 5-6 times per day. She has been watching her BP closely at home. It was initially well controlled after leaving the hospital but has been trending up the last week with systolic BP in the 952W to 170s. BP 184/100 today.  Past Medical History:  Diagnosis Date   Allergy    Anemia    had ekg/stress test/echo done in March at [facility] d/t symptoms of anemia-cardiac r/o per pt-records requested   Arthritis    Headache(784.0)    History of blood transfusion 05/2012   [facility]   Hyperlipidemia  Hypertension    meds d/c about 1 year ago after starting exercise program with b/p controlled-pcp aware   Menorrhagia 03/01/2012   Seizures ([place])    as an infant with high fevers   Sickle cell trait ([place])     Past Surgical History:  Procedure Laterality Date   CESAREAN SECTION     TEE WITHOUT CARDIOVERSION N/A 01/08/2021   Procedure: TRANSESOPHAGEAL ECHOCARDIOGRAM (TEE);  Surgeon: Catherine Matte, MD;  Location: [place];  Service: Open Heart Surgery;   Laterality: N/A;   THORACIC AORTIC ANEURYSM REPAIR N/A 01/08/2021   Procedure: Repair of Acute Ascending Thoracic Aortic Dissection Using Hemashield Platinum Graft Size 28MM;  Surgeon: Catherine Matte, MD;  Location: [place];  Service: Open Heart Surgery;  Laterality: N/A;   VAGINAL HYSTERECTOMY N/A 07/12/2012   Procedure: HYSTERECTOMY VAGINAL;  Surgeon: Catherine Dawley, MD;  Location: [facility];  Service: Gynecology;  Laterality: N/A;   WISDOM TOOTH EXTRACTION      Current Medications: Current Meds  Medication Sig   amLODipine (NORVASC) 5 MG tablet Take 1 tablet (5 mg total) by mouth daily.   aspirin EC 325 MG EC tablet Take 1 tablet (325 mg total) by mouth daily.   atorvastatin (LIPITOR) 80 MG tablet Take 1 tablet (80 mg total) by mouth daily.   carvedilol (COREG) 6.25 MG tablet Take 1 tablet (6.25 mg total) by mouth 2 (two) times daily.   furosemide (LASIX) 40 MG tablet Take 1 tablet (40 mg total) by mouth daily.   losartan (COZAAR) 25 MG tablet Take 1 tablet (25 mg total) by mouth daily.   potassium chloride (KLOR-CON) 10 MEQ tablet Take 1 tablet (10 mEq total) by mouth daily.   psyllium (METAMUCIL) 58.6 % powder Take 1 packet by mouth daily.   [DISCONTINUED] furosemide (LASIX) 40 MG tablet Take 1 tablet (40 mg total) by mouth daily. For one week then stop   [DISCONTINUED] guaiFENesin (MUCINEX) 600 MG 12 hr tablet Take 1 tablet (600 mg total) by mouth 2 (two) times daily. For 5 days.   [DISCONTINUED] lisinopril (ZESTRIL) 5 MG tablet Take 2 tablets (10 mg total) by mouth daily.   [DISCONTINUED] methocarbamol (ROBAXIN) 500 MG tablet Take 1 tablet (500 mg total) by mouth every 8 (eight) hours as needed for muscle spasms.   [DISCONTINUED] metoprolol tartrate (LOPRESSOR) 25 MG tablet Take 1 tablet (25 mg total) by mouth 2 (two) times daily.   [DISCONTINUED] potassium chloride SA (KLOR-CON) 20 MEQ tablet TAKE 1& 1/2 TABLETS EVERY DAY FOR 1 WEEK THEN STOP   [DISCONTINUED] traMADol (ULTRAM) 50 MG  tablet Take 1 tablet (50 mg total) by mouth every 6 (six) hours as needed for moderate pain.     Allergies:   Patient has no known allergies.   Social History   Socioeconomic History   Marital status: Married    Spouse name: Not on file   Number of children: Not on file   Years of education: Not on file   Highest education level: Not on file  Occupational History   Not on file  Tobacco Use   Smoking status: Never   Smokeless tobacco: Never  Substance and Sexual Activity   Alcohol use: Yes    Comment: RARE   Drug use: No   Sexual activity: Yes    Birth control/protection: Condom  Other Topics Concern   Not on file  Social History Narrative   Not on file   Social Determinants of Health   Financial Resource Strain:  Not on file  Food Insecurity: Not on file  Transportation Needs: Not on file  Physical Activity: Not on file  Stress: Not on file  Social Connections: Not on file     Family History: The patient's family history includes Diabetes in her maternal grandmother; Hyperlipidemia in her mother; Hypertension in her maternal grandmother and mother. There is no history of Colon cancer, Esophageal cancer, Rectal cancer, or Stomach cancer.  ROS:   Please see the history of present illness.     EKGs/Labs/Other Studies Reviewed:    The following studies were reviewed today:  Chest CTA 01/08/2021: Impression: 1. 5.1 cm ascending thoracic aortic aneurysm, with superimposed type A thoracic aortic dissection. Dissection extends from the aortic root through the diaphragmatic hiatus. The inferior margin of the dissection is not visualized due to slice selection. 2. No evidence of pulmonary embolus.  EKG:  EKG ordered today. EKG personally reviewed and demonstrates normal sinus rhythm, rate 95 bpm, with underlying artifact/baseline wandering and no acute ST/T changes compared to prior tracing. Normal axis. Normal PR and QRS intervals. QTc automatically read at 527 ms.  However, on my calculation using Bazett formula 453 ms.  Recent Labs: 01/08/2021: ALT 14; B Natriuretic Peptide 34.3 01/10/2021: Magnesium 2.9 01/17/2021: BUN 9; Creatinine, Ser 0.97; Hemoglobin 8.0; Platelets 377; Potassium 4.0; Sodium 138  Recent Lipid Panel    Component Value Date/Time   CHOL 253 (H) 01/08/2021 1435   TRIG 48 01/08/2021 1435   HDL 77 01/08/2021 1435   CHOLHDL 3.3 01/08/2021 1435   VLDL 10 01/08/2021 1435   LDLCALC 166 (H) 01/08/2021 1435    Physical Exam:    Vital Signs: BP (!) 184/100   Pulse 95   Ht '5\' 10"'  (1.778 m)   Wt 212 lb (96.2 kg)   LMP 05/31/2012   SpO2 96%   BMI 30.42 kg/m     Wt Readings from Last 3 Encounters:  01/29/21 212 lb (96.2 kg)  01/15/21 224 lb 12.8 oz (102 kg)  02/21/19 205 lb (93 kg)     General: 50 y.o. African-American female in no acute distress. HEENT: Normocephalic and atraumatic. Sclera clear. EOMs intact. Neck: Supple. No carotid bruits. No JVD. Heart: RRR. Distinct S1 and S2. II/VI systolic murmur. No gallops or rubs. Radial pulses 2+ and equal bilaterally. Sternotomy and chest tube incisions healing well.  Lungs: Mild labored breathing while talking with mask on. Crackles in bilateral bases. Abdomen: Soft, non-distended, and non-tender to palpation.  MSK: Generalized deconditioning from recent surgery. Ambulating with a walker. Extremities: 1+ pitting edema of bilateral lower extremities.   Skin: Warm and dry. Neuro: Alert and oriented x3. No focal deficits. Psych: Normal affect. Responds appropriately.  Assessment:    1. Type 1 dissection of ascending aorta   2. Acute on chronic diastolic congestive heart failure ([place])   3. Primary hypertension   4. Palpitations   5. Hyperlipidemia, unspecified hyperlipidemia type     Plan:    Thoracic Ascending Aortic Aneurysm with Recent Type A Dissection s/p Repair - Patient was recently admitted with thoracic ascending aortic aneurysm with acute Type A dissection. She  underwent emergent repair with Dr. Kipp Mcdonald with a 8 mm graft as well as aortic valve resuspension and replacement of the ascending aorta and arch. - She states she is getting better every day and is now able to ambulate without a walker but has evidence of volume overload on exam and BP very elevated. See plan below for this. - Currently  on Aspirin 356m daily. Discussed with Doctor of the Day (Dr. CSallyanne Kuster - will continue this for 3 months and then likely decrease to 830mdaily. - Follow-up with CT surgery as directed.  Acute on Chronic Diastolic CHF - Patient presents with signs of CHF after aortic dissection repair. She described shortness of breath both at rest and with exertion, orthopnea, lower extremity edema, and abdominal fullness. - Intraoperative TEE during surgery showed LVEF of 60-65% with moderate LVH. - Ordered chest x-ray today which showed cardiomegaly with pulmonary vascular congestion, bilateral small pleural effusion (right > left), and bibasilar atelectasis.  - Will check BNP.  - Will order TTE. This decision was made after seeing chest x-ray results following visit. Will update patient on this when I call her with lab results. - Restart Lasix 4051maily for 1 weeks. Will take potassium chloride 10 mEq with this.  Hypertension - BP uncontrolled at 184/100 today. Systolic BP has been running between 140 and 170 at home.  - Currently on Losartan 20m30mily, Lisinopril 10mg4mly, and Toprol 20mg 52my.  - Will stop Lisinopril and continue Losartan 20mg d56m. - Will stop Toprol-XL and start Coreg 6.20mg tw68mdialy. - Will start Amlodipine 5mg dail56mshe was on this prior to recent hospitalization but it was discontinued at discharge).  - Hoping BP will also improve as we diuresis her as above. - Will check BMET today. Depending on BMET results, may increase Losartan to 50mg dail38m add Spironolactone.  - Will arrange close follow-up visit in Dr. Addy[facility].  Patient will keep BP/HR log and bring to this visit. BP goal <130/80.  Palpitations - Patient episodes of "fluttering" that last for about 10 seconds at a time. Occurs daily. - EKG shows normal sinus rhythm today with no ectopy. - Will order 1 week Zio monitor to rule out atrial fibrillation or other concerning arrhythmia. Continue beta-blocker.  Hyperlipidemia - Lipid panel on 01/08/2021: Total Cholesterol 253, Triglycerides 48, HDL 77, LDL 166.  - Started on Lipitor 80mg daily57ming recent admission. Continue.  - Will need repeat lipid panel and LFTs in 6-8 weeks. Did not have time to discuss this today. Can order at follow-up visit.  Disposition: Follow up in 1-2 weeks in Dr. 's Blenda Mounts.   Medication Adjustments/Labs and Tests Ordered: Current medicines are reviewed at length with the patient today.  Concerns regarding medicines are outlined above.  Orders Placed This Encounter  Procedures   DG Chest 2 View   Brain natriuretic peptide   Basic metabolic panel   CBC   EKG 12-Lead   Meds ordered this encounter  Medications   furosemide (LASIX) 40 MG tablet    Sig: Take 1 tablet (40 mg total) by mouth daily.    Dispense:  30 tablet    Refill:  0   carvedilol (COREG) 6.25 MG tablet    Sig: Take 1 tablet (6.25 mg total) by mouth 2 (two) times daily.    Dispense:  180 tablet    Refill:  1   amLODipine (NORVASC) 5 MG tablet    Sig: Take 1 tablet (5 mg total) by mouth daily.    Dispense:  180 tablet    Refill:  3   potassium chloride (KLOR-CON) 10 MEQ tablet    Sig: Take 1 tablet (10 mEq total) by mouth daily.    Dispense:  30 tablet    Refill:  1    Patient Instructions  Medication Instructions:  Stop Lisinopril.  Stop Metoprolol. Start Lasix 40 mg ( 1 Tablet Daily for 7 Days). Start Potassium 10 meq ( 1 Tablet Daily for 7 Days) Start Amlodipine 5 mg ( Take 1 Tablet Daily) Start Coreg 6.25 mg ( Take 1 Tablet Twice Daily). *If you need a refill on your cardiac  medications before your next appointment, please call your pharmacy*   Lab Work: [facility], [facility], : Today If you have labs (blood work) drawn today and your tests are completely normal, you will receive your results only by: [place] (if you have MyChart) OR A paper copy in the mail If you have any lab test that is abnormal or we need to change your treatment, we will call you to review the results.   Testing/Procedures: [facility], [address]. :Today A chest x-ray takes a picture of the organs and structures inside the chest, including the heart, lungs, and blood vessels. This test can show several things, including, whether the heart is enlarges; whether fluid is building up in the lungs; and whether pacemaker / defibrillator leads are still in place.   ZIO AT Long term monitor-Live Telemetry  Your physician has requested you wear a ZIO patch monitor for 14 days.  This is a single patch monitor. [employer] supplies one patch monitor per enrollment. Additional  stickers are not available.  Please do not apply patch if you will be having a Nuclear Stress Test, Echocardiogram, Cardiac CT, MRI,  or Chest Xray during the period you would be wearing the monitor. The patch cannot be worn during  these tests. You cannot remove and re-apply the ZIO AT patch monitor.  Your ZIO patch monitor will be mailed 3 day USPS to your address on file. It may take 3-5 days to  receive your monitor after you have been enrolled.  Once you have received your monitor, please review the enclosed instructions. Your monitor has  already been registered assigning a specific monitor serial # to you.   Billing and Patient Assistance Program information  Theodore Demark has been supplied with any insurance information on record for billing. [employer] offers a sliding scale Patient Assistance Program for patients without insurance, or whose  insurance does not completely cover the cost of the ZIO patch  monitor. You must apply for the  Patient Assistance Program to qualify for the discounted rate. To apply, call [employer] at [phone],  select option 4, select option 2 , ask to apply for the Patient Assistance Program, (you can request an  interpreter if needed). [employer] will ask your household income and how many people are in your  household. [employer] will quote your out-of-pocket cost based on this information. They will also be able  to set up a 12 month interest free payment plan if needed.  Applying the monitor   Shave hair from upper left chest.  Hold the abrader disc by orange tab. Rub the abrader in 40 strokes over left upper chest as indicated in  your monitor instructions.  Clean area with 4 enclosed alcohol pads. Use all pads to ensure the area is cleaned thoroughly. Let  dry.  Apply patch as indicated in monitor instructions. Patch will be placed under collarbone on left side of  chest with arrow pointing upward.  Rub patch adhesive wings for 2 minutes. Remove the white label marked "1". Remove the white label  marked "2". Rub patch adhesive wings for 2 additional minutes.  While looking in a mirror, press and release button in center of  patch. A small green light will flash 3-4  times. This will be your only indicator that the monitor has been turned on.  Do not shower for the first 24 hours. You may shower after the first 24 hours.  Press the button if you feel a symptom. You will hear a small click. Record Date, Time and Symptom in  the Patient Log.   Starting the Gateway  In your kit there is a Hydrographic surveyor box the size of a cellphone. This is Airline pilot. It transmits all your  recorded data to [facility]. This box must always stay within 10 feet of you. Open the box and push the *  button. There will be a light that blinks orange and then green a few times. When the light stops  blinking, the Gateway is connected to the ZIO patch. Call [employer] at [phone] to  confirm your monitor is transmitting.  Returning your monitor  Remove your patch and place it inside the [place]. In the lower half of the Gateway there is a white  bag with prepaid postage on it. Place Gateway in bag and seal. Mail package back to [place] as soon as  possible. Your physician should have your final report approximately 7 days after you have mailed back  your monitor. Call [place] at [phone] if you have questions regarding your ZIO AT  patch monitor. Call them immediately if you see an orange light blinking on your monitor.  If your monitor falls off in less than 4 days, contact our Monitor department at [phone]. If your  monitor becomes loose or falls off after 4 days call [employer] at [phone] for suggestions on  securing your monitor   Follow-Up: At [facility], you and your health needs are our priority.  As part of our continuing mission to provide you with exceptional heart care, we have created designated Provider Care Teams.  These Care Teams include your primary Cardiologist (physician) and Advanced Practice Providers (APPs -  Physician Assistants and Nurse Practitioners) who all work together to provide you with the care you need, when you need it.  We recommend signing up for the patient portal called "MyChart".  Sign up information is provided on this After Visit Summary.  MyChart is used to connect with patients for Virtual Visits (Telemedicine).  Patients are able to view lab/test results, encounter notes, upcoming appointments, etc.  Non-urgent messages can be sent to your provider as well.   To learn more about what you can do with MyChart, go to [email or web address].    Your next appointment:   1-2 Weeks  The format for your next appointment:   In Person  Provider:   Skeet Latch, MD{ If MD is not listed, click here to update         Signed, Darreld Mclean, PA-C  01/29/2021 5:35 PM    [place]

## 2021-01-20 ENCOUNTER — Telehealth (HOSPITAL_COMMUNITY): Payer: Self-pay

## 2021-01-20 NOTE — Telephone Encounter (Signed)
Pt is interested in the cardiac rehab program in [facility], will fax over pt ppw to [facility] cardiac rehab. Closed referral

## 2021-01-21 ENCOUNTER — Other Ambulatory Visit: Payer: Self-pay | Admitting: Physician Assistant

## 2021-01-22 ENCOUNTER — Other Ambulatory Visit: Payer: Self-pay

## 2021-01-22 ENCOUNTER — Ambulatory Visit (INDEPENDENT_AMBULATORY_CARE_PROVIDER_SITE_OTHER): Payer: Self-pay | Admitting: Thoracic Surgery (Cardiothoracic Vascular Surgery)

## 2021-01-22 DIAGNOSIS — I7101 Dissection of ascending aorta: Secondary | ICD-10-CM

## 2021-01-22 MED ORDER — FUROSEMIDE 40 MG PO TABS: 40.0000 mg | ORAL_TABLET | Freq: Every day | ORAL | 0 refills | Status: DC

## 2021-01-22 MED ORDER — LISINOPRIL 5 MG PO TABS: 5.0000 mg | ORAL_TABLET | Freq: Two times a day (BID) | ORAL | 6 refills | Status: DC

## 2021-01-22 NOTE — Progress Notes (Signed)
     [address]       [place] [address]             [phone]       Patient: Home Provider: Office Consent for Telemedicine visit obtained.  Today's visit was completed via a real-time telehealth (see specific modality noted below). The patient/authorized person provided oral consent at the time of the visit to engage in a telemedicine encounter with the present provider at [facility]. The patient/authorized person was informed of the potential benefits, limitations, and risks of telemedicine. The patient/authorized person expressed understanding that the laws that protect confidentiality also apply to telemedicine. The patient/authorized person acknowledged understanding that telemedicine does not provide emergency services and that he or she would need to call 911 or proceed to the nearest hospital for help if such a need arose.   Total time spent in the clinical discussion 10 minutes.  Telehealth Modality: Phone visit (audio only)  I had a telephone visit with Catherine Mcdonald who is s/p Type A dissection repair.  Overall doing well.  Pain is minimal.  Ambulating well. Vitals have been stable, but her blood pressure has been elevated 160s/90s.  I have added lisinopril to her medications.  She will see [place] back in 1 month with a chest x-ray for cardiac rehab clearance and ongoing surveillance.  Jamaurie Bernier Bary Leriche

## 2021-01-22 NOTE — Telephone Encounter (Signed)
FMLA/STD forms competed. Patient's husband will pick up the forms at front dest today. Beginning leave 01/08/2021 through approx 04/05/2021.

## 2021-01-22 NOTE — Addendum Note (Signed)
Addended by: Lajuana Matte on: 01/22/2021 03:14 PM   Modules accepted: Orders

## 2021-01-25 ENCOUNTER — Other Ambulatory Visit: Payer: Self-pay | Admitting: Thoracic Surgery (Cardiothoracic Vascular Surgery)

## 2021-01-25 MED ORDER — METOPROLOL TARTRATE 25 MG PO TABS: 25.0000 mg | ORAL_TABLET | Freq: Two times a day (BID) | ORAL | 1 refills | Status: DC

## 2021-01-25 MED ORDER — LISINOPRIL 5 MG PO TABS: 10.0000 mg | ORAL_TABLET | Freq: Every day | ORAL | 1 refills | Status: DC

## 2021-01-26 ENCOUNTER — Other Ambulatory Visit: Payer: Self-pay | Admitting: Thoracic Surgery (Cardiothoracic Vascular Surgery)

## 2021-01-29 ENCOUNTER — Ambulatory Visit (INDEPENDENT_AMBULATORY_CARE_PROVIDER_SITE_OTHER): Payer: BC Managed Care – PPO

## 2021-01-29 ENCOUNTER — Telehealth: Payer: Self-pay | Admitting: Student

## 2021-01-29 ENCOUNTER — Encounter: Payer: Self-pay | Admitting: Student

## 2021-01-29 ENCOUNTER — Ambulatory Visit
Admission: RE | Admit: 2021-01-29 | Discharge: 2021-01-29 | Disposition: A | Discharge status: Home / Self Care | Payer: BC Managed Care – PPO | Source: Ambulatory Visit | Attending: Student | Admitting: Student

## 2021-01-29 ENCOUNTER — Other Ambulatory Visit: Payer: Self-pay

## 2021-01-29 ENCOUNTER — Other Ambulatory Visit: Payer: Self-pay | Admitting: Student

## 2021-01-29 ENCOUNTER — Ambulatory Visit (INDEPENDENT_AMBULATORY_CARE_PROVIDER_SITE_OTHER): Payer: BC Managed Care – PPO | Admitting: Student

## 2021-01-29 VITALS — BP 184/100 | HR 95 | Ht 70.0 in | Wt 212.0 lb

## 2021-01-29 DIAGNOSIS — R002 Palpitations: Secondary | ICD-10-CM

## 2021-01-29 DIAGNOSIS — I10 Essential (primary) hypertension: Secondary | ICD-10-CM

## 2021-01-29 DIAGNOSIS — I7101 Dissection of ascending aorta: Secondary | ICD-10-CM

## 2021-01-29 DIAGNOSIS — I5043 Acute on chronic combined systolic (congestive) and diastolic (congestive) heart failure: Secondary | ICD-10-CM

## 2021-01-29 DIAGNOSIS — I5033 Acute on chronic diastolic (congestive) heart failure: Secondary | ICD-10-CM

## 2021-01-29 DIAGNOSIS — E785 Hyperlipidemia, unspecified: Secondary | ICD-10-CM

## 2021-01-29 MED ORDER — AMLODIPINE BESYLATE 5 MG PO TABS: 5.0000 mg | ORAL_TABLET | Freq: Every day | ORAL | 3 refills | Status: DC

## 2021-01-29 MED ORDER — CARVEDILOL 6.25 MG PO TABS: 6.2500 mg | ORAL_TABLET | Freq: Two times a day (BID) | ORAL | 1 refills | Status: DC

## 2021-01-29 MED ORDER — POTASSIUM CHLORIDE ER 10 MEQ PO TBCR: 10.0000 meq | EXTENDED_RELEASE_TABLET | Freq: Every day | ORAL | 1 refills | Status: DC

## 2021-01-29 MED ORDER — FUROSEMIDE 40 MG PO TABS: 40.0000 mg | ORAL_TABLET | Freq: Every day | ORAL | 0 refills | Status: DC

## 2021-01-29 NOTE — Telephone Encounter (Signed)
Chest xray report

## 2021-01-29 NOTE — Telephone Encounter (Signed)
Call report on Chest Xray   IMPRESSION: Cardiomegaly with pulmonary vascular congestion.   Bilateral small pleural effusions, right greater than left with bibasilar atelectasis.  Will forward to [employer]

## 2021-01-29 NOTE — Progress Notes (Unsigned)
Enrolled patient for a 7 day Zio XT monitor to be mailed to patients home   Dr Hilty to read 

## 2021-01-29 NOTE — Patient Instructions (Addendum)
Medication Instructions:  Stop Lisinopril. Stop Metoprolol. Start Lasix 40 mg ( 1 Tablet Daily for 7 Days). Start Potassium 10 meq ( 1 Tablet Daily for 7 Days) Start Amlodipine 5 mg ( Take 1 Tablet Daily) Start Coreg 6.25 mg ( Take 1 Tablet Twice Daily). *If you need a refill on your cardiac medications before your next appointment, please call your pharmacy*   Lab Work: [facility], [facility], : Today If you have labs (blood work) drawn today and your tests are completely normal, you will receive your results only by: [place] (if you have MyChart) OR A paper copy in the mail If you have any lab test that is abnormal or we need to change your treatment, we will call you to review the results.   Testing/Procedures: [facility], [address]. :Today A chest x-ray takes a picture of the organs and structures inside the chest, including the heart, lungs, and blood vessels. This test can show several things, including, whether the heart is enlarges; whether fluid is building up in the lungs; and whether pacemaker / defibrillator leads are still in place.   ZIO AT Long term monitor-Live Telemetry  Your physician has requested you wear a ZIO patch monitor for 14 days.  This is a single patch monitor. [employer] supplies one patch monitor per enrollment. Additional  stickers are not available.  Please do not apply patch if you will be having a Nuclear Stress Test, Echocardiogram, Cardiac CT, MRI,  or Chest Xray during the period you would be wearing the monitor. The patch cannot be worn during  these tests. You cannot remove and re-apply the ZIO AT patch monitor.  Your ZIO patch monitor will be mailed 3 day USPS to your address on file. It may take 3-5 days to  receive your monitor after you have been enrolled.  Once you have received your monitor, please review the enclosed instructions. Your monitor has  already been registered assigning a specific monitor serial # to you.    Billing and Patient Assistance Program information  Catherine Mcdonald has been supplied with any insurance information on record for billing. [employer] offers a sliding scale Patient Assistance Program for patients without insurance, or whose  insurance does not completely cover the cost of the ZIO patch monitor. You must apply for the  Patient Assistance Program to qualify for the discounted rate. To apply, call [employer] at [phone],  select option 4, select option 2 , ask to apply for the Patient Assistance Program, (you can request an  interpreter if needed). [employer] will ask your household income and how many people are in your  household. [employer] will quote your out-of-pocket cost based on this information. They will also be able  to set up a 12 month interest free payment plan if needed.  Applying the monitor   Shave hair from upper left chest.  Hold the abrader disc by orange tab. Rub the abrader in 40 strokes over left upper chest as indicated in  your monitor instructions.  Clean area with 4 enclosed alcohol pads. Use all pads to ensure the area is cleaned thoroughly. Let  dry.  Apply patch as indicated in monitor instructions. Patch will be placed under collarbone on left side of  chest with arrow pointing upward.  Rub patch adhesive wings for 2 minutes. Remove the white label marked "1". Remove the white label  marked "2". Rub patch adhesive wings for 2 additional minutes.  While looking in a mirror, press and release  button in center of patch. A small green light will flash 3-4  times. This will be your only indicator that the monitor has been turned on.  Do not shower for the first 24 hours. You may shower after the first 24 hours.  Press the button if you feel a symptom. You will hear a small click. Record Date, Time and Symptom in  the Patient Log.   Starting the Gateway  In your kit there is a Hydrographic surveyor box the size of a cellphone. This is Airline pilot. It transmits all  your  recorded data to [facility]. This box must always stay within 10 feet of you. Open the box and push the *  button. There will be a light that blinks orange and then green a few times. When the light stops  blinking, the Gateway is connected to the ZIO patch. Call [employer] at [phone] to confirm your monitor is transmitting.  Returning your monitor  Remove your patch and place it inside the [place]. In the lower half of the Gateway there is a white  bag with prepaid postage on it. Place Gateway in bag and seal. Mail package back to [place] as soon as  possible. Your physician should have your final report approximately 7 days after you have mailed back  your monitor. Call [place] at [phone] if you have questions regarding your ZIO AT  patch monitor. Call them immediately if you see an orange light blinking on your monitor.  If your monitor falls off in less than 4 days, contact our Monitor department at [phone]. If your  monitor becomes loose or falls off after 4 days call [employer] at [phone] for suggestions on  securing your monitor   Follow-Up: At [facility], you and your health needs are our priority.  As part of our continuing mission to provide you with exceptional heart care, we have created designated Provider Care Teams.  These Care Teams include your primary Cardiologist (physician) and Advanced Practice Providers (APPs -  Physician Assistants and Nurse Practitioners) who all work together to provide you with the care you need, when you need it.  We recommend signing up for the patient portal called "MyChart".  Sign up information is provided on this After Visit Summary.  MyChart is used to connect with patients for Virtual Visits (Telemedicine).  Patients are able to view lab/test results, encounter notes, upcoming appointments, etc.  Non-urgent messages can be sent to your provider as well.   To learn more about what you can do  with MyChart, go to [email or web address].    Your next appointment:   1-2 Weeks  The format for your next appointment:   In Person  Provider:   Skeet Latch, MD{ If MD is not listed, click here to update

## 2021-01-30 ENCOUNTER — Telehealth: Payer: Self-pay | Admitting: Student

## 2021-01-30 DIAGNOSIS — I5033 Acute on chronic diastolic (congestive) heart failure: Secondary | ICD-10-CM

## 2021-01-30 LAB — BASIC METABOLIC PANEL
BUN/Creatinine Ratio: 5 — ABNORMAL LOW (ref 9–23)
BUN: 3 mg/dL — ABNORMAL LOW (ref 6–24)
CO2: 23 mmol/L (ref 20–29)
Calcium: 8.8 mg/dL (ref 8.7–10.2)
Chloride: 105 mmol/L (ref 96–106)
Creatinine, Ser: 0.62 mg/dL (ref 0.57–1.00)
Glucose: 83 mg/dL (ref 70–99)
Potassium: 4.1 mmol/L (ref 3.5–5.2)
Sodium: 143 mmol/L (ref 134–144)
eGFR: 107 mL/min/{1.73_m2} (ref >59)

## 2021-01-30 LAB — CBC
Hematocrit: 33.4 % — ABNORMAL LOW (ref 34.0–46.6)
Hemoglobin: 10.5 g/dL — ABNORMAL LOW (ref 11.1–15.9)
MCH: 25.4 pg — ABNORMAL LOW (ref 26.6–33.0)
MCHC: 31.4 g/dL — ABNORMAL LOW (ref 31.5–35.7)
MCV: 81 fL (ref 79–97)
Platelets: 638 10*3/uL — ABNORMAL HIGH (ref 150–450)
RBC: 4.14 x10E6/uL (ref 3.77–5.28)
RDW: 17 % — ABNORMAL HIGH (ref 11.7–15.4)
WBC: 10.9 10*3/uL — ABNORMAL HIGH (ref 3.4–10.8)

## 2021-01-30 LAB — BRAIN NATRIURETIC PEPTIDE: BNP: 183.9 pg/mL — ABNORMAL HIGH (ref 0.0–100.0)

## 2021-01-30 NOTE — Telephone Encounter (Addendum)
   I saw patient yesterday in clinic for hospital follow-up after acute type A aortic dissection. She was volume overloaded on exam and had signs and symptoms consistent with acute CHF. I made multiple medication adjustments including starting her on Lasix for 7 days (please see office visit note from yesterday for full details). Chest x-ray from yesterday showed cardiomegaly with pulmonary vascular congestion, bilateral small pleural effusion (right > left), and bibasilar atelectasis. BNP is still pending but BMET shows normal kidney function and electrolytes. CBC shows hemoglobin has improved. Platelets are elevated which suspect is due to recent surgery.   Given normal renal function and electrolytes, I would also like to add Spironolactone 12.5mg  daily to help her diuresis as well as help her BP. Plan is already for her to have a repeat BMET in 1 week. I tried to call patient with these results and recommendations twice but was unable to reach her. Left a message asking her to call back. I also called and updated the APP on call this weekend in case patient calls the office back.  Darreld Mclean, PA-C 01/30/2021 11:13 AM  ADDENDUM:  Patient called back and I spoke with her. She does not sound as short of breath today and she states her BP is much better this morning in the 120s/80s (that was with taking Amlodipine last night and before taking any of her medications this morning). Therefore, I will not add Spironolactone at this time. Reviewed chest x-ray results and CBC and BMET results. Given cardiomegaly on chest x-ray and murmur noted on exam yesterday, will get TTE. Reviewed recommendations to have patient come in for repeat lab work in 1 week. Also reminded patient of her follow-up visit with Dr. Oval Linsey on 02/11/2021 and bring BP/HR log to this visit. Patient was very appreciative of the call.  Darreld Mclean, PA-C 01/30/2021 11:55 AM

## 2021-01-30 NOTE — Addendum Note (Signed)
Addended by: Sande Rives on: 01/30/2021 12:18 PM   Modules accepted: Orders

## 2021-02-01 ENCOUNTER — Telehealth: Payer: Self-pay | Admitting: Student

## 2021-02-01 NOTE — Telephone Encounter (Signed)
Can we see what her heart rate is? I will also try to call her this afternoon whenever I get a chance. It will probably be at the end of clinic.  Thank you!

## 2021-02-01 NOTE — Addendum Note (Signed)
Addended by: Sande Rives on: 02/01/2021 05:47 PM   Modules accepted: Orders

## 2021-02-01 NOTE — Telephone Encounter (Signed)
Spoke with pt, she is currently taking, amlodipine 5 mg, carvedilol 6.25 mg BID and losartan 25. Aware will forward to callie to review.

## 2021-02-01 NOTE — Telephone Encounter (Signed)
I called patient earlier this evening to discuss BP concerns and addressed these 2 questions at that time. Explained that the Echo is a much better way of assessing cardiomegaly and will give [place] a lot more information. She is scheduled to have this done on 02/17/2021. Also advised that she will likely be on a beta-blocker for a long time given aortic dissection.  Darreld Mclean, PA-C 02/01/2021 5:50 PM

## 2021-02-01 NOTE — Telephone Encounter (Signed)
Called and spoke with patient's husband Ezmae Speers who is on [place]. When asked what was his wife's heart rate (HR) for  BP 100/61 he stated that it was 105 and he gave the for the Saturday BP with which was 106. I informed him that Annamary Carolin will be giving his wife a call this afternoon when she is finished with seeing patients. He thanked me for calling and all (if any)  questions were answered.

## 2021-02-01 NOTE — Telephone Encounter (Signed)
Pt c/o BP issue: STAT if pt c/o blurred vision, one-sided weakness or slurred speech  1. What are your last 5 BP readings?  100/61 currently 98/59 TODAY 98/63 this morning  112/72 yesterday 124/86 Saturday    2. Are you having any other symptoms (ex. Dizziness, headache, blurred vision, passed out)? A LITTLE LIGHTHEADED ABOUT AN HOUR AGO AFTER PT TOOK HER BP MEDS  3. What is your BP issue? PT WAS IN THE OFFICE TO SEE CALLIE Friday. PT'S BP MEDS WERE CHANGED AND NOW THE PT'S BP IS TOO LOW

## 2021-02-01 NOTE — Telephone Encounter (Signed)
Called and spoke with patient. Reviewed BP below. She states heart rates have been in the 80s today. Given soft BP with associated lightheadedness, will stop Losartan. Patient will continue to monitor BP/HR.   She is still having some shortness of breath at times. She does feel like she is having good urinary response to Lasix and lower extremity has improved. She just feel like she cannot take a full breath especially when she is sitting with her legs propped up. Encouraged her to continue to use incentive spirometry.  Otherwise, will continue with plan discussed at office visit on 01/29/2021. Patient was appreciative of the calls.  Darreld Mclean, PA-C 02/01/2021 5:45 PM

## 2021-02-02 DIAGNOSIS — R002 Palpitations: Secondary | ICD-10-CM | POA: Diagnosis not present

## 2021-02-02 DIAGNOSIS — I5043 Acute on chronic combined systolic (congestive) and diastolic (congestive) heart failure: Secondary | ICD-10-CM

## 2021-02-02 DIAGNOSIS — I7101 Dissection of ascending aorta: Secondary | ICD-10-CM | POA: Diagnosis not present

## 2021-02-11 ENCOUNTER — Encounter (HOSPITAL_BASED_OUTPATIENT_CLINIC_OR_DEPARTMENT_OTHER): Payer: Self-pay | Admitting: Cardiovascular Disease

## 2021-02-11 ENCOUNTER — Ambulatory Visit (INDEPENDENT_AMBULATORY_CARE_PROVIDER_SITE_OTHER): Payer: BC Managed Care – PPO | Admitting: Cardiovascular Disease

## 2021-02-11 ENCOUNTER — Other Ambulatory Visit: Payer: Self-pay

## 2021-02-11 DIAGNOSIS — I7101 Dissection of ascending aorta: Secondary | ICD-10-CM

## 2021-02-11 DIAGNOSIS — E7800 Pure hypercholesterolemia, unspecified: Secondary | ICD-10-CM

## 2021-02-11 DIAGNOSIS — I10 Essential (primary) hypertension: Secondary | ICD-10-CM | POA: Diagnosis not present

## 2021-02-11 HISTORY — DX: Pure hypercholesterolemia, unspecified: E78.00

## 2021-02-11 HISTORY — DX: Essential (primary) hypertension: I10

## 2021-02-11 NOTE — Assessment & Plan Note (Signed)
Blood pressures well-controlled.  She is going to start spacing her carvedilol out more evenly to avoid high blood pressures before her next morning dose.  Continue amlodipine, carvedilol, and losartan.

## 2021-02-11 NOTE — Progress Notes (Addendum)
Cardiology Office Note:    Date:  02/11/2021   ID:  PERLE BRICKHOUSE, DOB [birth year], MRN [id number]  PCP:  Robyne Peers, MD   [facility] Providers Cardiologist:  Pixie Casino, MD     Referring MD: Robyne Peers, MD   No chief complaint on file.  History of Present Illness:    NAMYA VOGES is a 50 y.o. 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 She was admitted 12/2020 with acute type A dissection. She initially presented as an NSTEMI however she was taken to the CT scanner and found to actually have a Type A dissection.  She underwent emergent surgery with Dr. Kipp Brood, where she had an 8 mm graft placed and an aortic valve re-suspension replacement of the ascending aorta and arch. Post-operatively she has continued to have poorly controlled blood pressures and started to take lisinopril She followed up with Sande Rives, PA-C and reported edema and cough. Her blood pressures remained uncontrolled, so metoprolol was switched to carvedilol. She was both on lisinopril and losartan, so lisinopril was discontinued. Amlodipine was started. She reported palpitations and was given a 7 day Zio monitor.  Today, she is accompanied by her husband. Lately she has been feeling better. They report her main issue at this time is severe dyspnea. She becomes short winded with minimal exertion such as walking to the bathroom. For exercise, she will go walking around the neighborhood, but still becomes short of breath easily. With the cold weather she has been trying to go on longer walks. If she is sitting upright she generally feels okay. If she stands for a prolonged period, her voice starts getting raspy and it is more difficult to breathe. While sleeping at night she uses two pillows because she feels too much pressure if she lies flat. She also struggles with LE  edema, and will elevate her legs as needed. With coughing she will feel tenderness in her chest. Certain positions induce a coughing episode, with phlegm production. If she coughs too severely she will become nauseous as well with dry heaves. This has been occurring at least once a day. She also continues to feel flutters at times. Additionally, she has had constant tingling in her feet and toes daily since being in the hospital. She presents a BP log, which shows higher readings in the mornings. After taking her medication, her BP may decrease from 782 to 956 systolic. She usually takes her doses of carvedilol between 9 and 10 AM and between 5 and 6 PM. Prior to her hospitalization her blood pressure had been well controlled on only 5 mg amlodipine. Currently her regimen includes carvedilol, amlodipine, and losartan. Previously she drank coffee regularly, but now she avoids coffee completely. She is not a smoker. She denies any lightheadedness, headaches, syncope, or PND.  Past Medical History:  Diagnosis Date   Allergy    Anemia    had ekg/stress test/echo done in March at [facility] d/t symptoms of anemia-cardiac r/o per pt-records requested   Arthritis    Essential hypertension 02/11/2021   Headache(784.0)    History of blood transfusion 05/2012   [facility]   Hyperlipidemia    Hypertension    meds d/c about 1 year ago after starting exercise program with b/p controlled-pcp aware   Menorrhagia 03/01/2012   Pure hypercholesterolemia 02/11/2021   Seizures ([place])    as an  infant with high fevers   Sickle cell trait ([place])     Past Surgical History:  Procedure Laterality Date   CESAREAN SECTION     TEE WITHOUT CARDIOVERSION N/A 01/08/2021   Procedure: TRANSESOPHAGEAL ECHOCARDIOGRAM (TEE);  Surgeon: Lajuana Matte, MD;  Location: [place];  Service: Open Heart Surgery;  Laterality: N/A;   THORACIC AORTIC ANEURYSM REPAIR N/A 01/08/2021   Procedure: Repair of Acute  Ascending Thoracic Aortic Dissection Using Hemashield Platinum Graft Size 28MM;  Surgeon: Lajuana Matte, MD;  Location: [place];  Service: Open Heart Surgery;  Laterality: N/A;   VAGINAL HYSTERECTOMY N/A 07/12/2012   Procedure: HYSTERECTOMY VAGINAL;  Surgeon: Ena Dawley, MD;  Location: [facility];  Service: Gynecology;  Laterality: N/A;   WISDOM TOOTH EXTRACTION      Current Medications: Current Meds  Medication Sig   amLODipine (NORVASC) 5 MG tablet Take 1 tablet (5 mg total) by mouth daily.   aspirin EC 325 MG EC tablet Take 1 tablet (325 mg total) by mouth daily.   atorvastatin (LIPITOR) 80 MG tablet Take 1 tablet (80 mg total) by mouth daily.   carvedilol (COREG) 6.25 MG tablet Take 1 tablet (6.25 mg total) by mouth 2 (two) times daily.   losartan (COZAAR) 25 MG tablet Take 25 mg by mouth daily.   psyllium (METAMUCIL) 58.6 % powder Take 1 packet by mouth daily.   [DISCONTINUED] furosemide (LASIX) 40 MG tablet Take 1 tablet (40 mg total) by mouth daily.   [DISCONTINUED] potassium chloride (KLOR-CON) 10 MEQ tablet Take 1 tablet (10 mEq total) by mouth daily.     Allergies:   Patient has no known allergies.   Social History   Socioeconomic History   Marital status: Married    Spouse name: Not on file   Number of children: Not on file   Years of education: Not on file   Highest education level: Not on file  Occupational History   Not on file  Tobacco Use   Smoking status: Never   Smokeless tobacco: Never  Substance and Sexual Activity   Alcohol use: Yes    Comment: RARE   Drug use: No   Sexual activity: Yes    Birth control/protection: Condom  Other Topics Concern   Not on file  Social History Narrative   Not on file   Social Determinants of Health   Financial Resource Strain: Not on file  Food Insecurity: Not on file  Transportation Needs: Not on file  Physical Activity: Not on file  Stress: Not on file  Social Connections: Not on file     Family  History: The patient's family history includes Diabetes in her maternal grandmother; Hyperlipidemia in her mother; Hypertension in her maternal grandmother and mother. There is no history of Colon cancer, Esophageal cancer, Rectal cancer, or Stomach cancer.  ROS:   Please see the history of present illness.    (+) Shortness of breath (+) Orthopnea (+) Cough, sometimes with phlegm production, nausea, and dry heaves (+) Chest tenderness (+) Tingling/Numbness in bilateral feet and toes (+) LE edema (+) Palpitations All other systems reviewed and are negative.  EKGs/Labs/Other Studies Reviewed:    The following studies were reviewed today:  UE Venous Duplex 01/14/2021: Summary:     Left:  No evidence of deep vein thrombosis in the upper extremity. Findings  consistent with age indeterminate superficial vein thrombosis involving the left Cephalic vein.   CT-A Chest 01/08/2021: COMPARISON:  01/08/2021   FINDINGS: Cardiovascular: This is  a technically adequate evaluation of the pulmonary vasculature. No filling defects or pulmonary emboli.   There is a type A thoracic aortic dissection, extending from the aortic root through the diaphragmatic hiatus. The distal margin of the aortic dissection is not visualized due to slice selection. The true lumen gives rise to the great vessels. I do not see any evidence of aortic rupture. Maximal diameter of the ascending thoracic aorta measures 5.1 cm.   The heart is unremarkable without pericardial effusion.   Mediastinum/Nodes: No enlarged mediastinal, hilar, or axillary lymph nodes. Thyroid gland, trachea, and esophagus demonstrate no significant findings.   Lungs/Pleura: No acute airspace disease, effusion, or pneumothorax. Central airways are patent. Minimal hypoventilatory changes at the lung bases.   Upper Abdomen: Limited imaging through the upper abdomen demonstrates no additional findings.   Musculoskeletal: There are no  acute or destructive bony lesions. Reconstructed images demonstrate no additional findings.   Review of the MIP images confirms the above findings.   IMPRESSION: 1. 5.1 cm ascending thoracic aortic aneurysm, with superimposed type A thoracic aortic dissection. Dissection extends from the aortic root through the diaphragmatic hiatus. The inferior margin of the dissection is not visualized due to slice selection. 2. No evidence of pulmonary embolus.   Critical Value/emergent results were called by telephone at the time of interpretation on 01/08/2021 at 5:09 pm to provider [facility] , who verbally acknowledged these results.   Echo Intraoperative TEE 25/95/6387: Complications: No known complications during this procedure.  POST-OP IMPRESSIONS  _ Left Ventricle: The left ventricle is unchanged from pre-bypass. has  normal systolic function, with an ejection fraction of 60%. The cavity size was normal. The wall motion is normal.  _ Right Ventricle: The right ventricle appears unchanged from pre-bypass.  _ Aorta: A graft was placed in the ascending aorta for repair.Type A  dissection repair with hemiarch.  _ Aortic Valve: There is no regurgitation.  _ Mitral Valve: The mitral valve appears unchanged from pre-bypass.  _ Tricuspid Valve: The tricuspid valve appears unchanged from pre-bypass.  _ Pulmonic Valve: The pulmonic valve appears unchanged from pre-bypass.  _ Interatrial Septum: The interatrial septum appears unchanged from  pre-bypass.  _ Pericardium: The pericardium appears unchanged from pre-bypass.  _ Comments: Post-bypass images reviewed with surgeon.     EKG:    02/11/2021: EKG was not ordered.  Recent Labs: 01/08/2021: ALT 14 01/10/2021: Magnesium 2.9 01/29/2021: BNP 183.9; BUN 3; Creatinine, Ser 0.62; Hemoglobin 10.5; Platelets 638; Potassium 4.1; Sodium 143   Recent Lipid Panel    Component Value Date/Time   CHOL 253 (H) 01/08/2021 1435   TRIG 48 01/08/2021  1435   HDL 77 01/08/2021 1435   CHOLHDL 3.3 01/08/2021 1435   VLDL 10 01/08/2021 1435   LDLCALC 166 (H) 01/08/2021 1435     Risk Assessment/Calculations:           Physical Exam:    Wt Readings from Last 3 Encounters:  02/11/21 201 lb 6.4 oz (91.4 kg)  01/29/21 212 lb (96.2 kg)  01/15/21 224 lb 12.8 oz (102 kg)     VS:  BP 132/84 (BP Location: Right Arm, Patient Position: Sitting, Cuff Size: Normal)   Pulse 86   Ht 5\' 10"  (1.778 m)   Wt 201 lb 6.4 oz (91.4 kg)   LMP 05/31/2012   SpO2 93%   BMI 28.90 kg/m  , BMI Body mass index is 28.9 kg/m. GENERAL:  Well appearing HEENT: Pupils equal round and reactive, fundi not  visualized, oral mucosa unremarkable NECK:  No jugular venous distention, waveform within normal limits, carotid upstroke brisk and symmetric, carotid bruits, no thyromegaly LYMPHATICS:  No cervical adenopathy LUNGS:  Clear to auscultation bilaterally HEART:  RRR.  PMI not displaced or sustained,S1 and S2 within normal limits, no S3, no S4, no clicks, no rubs, III/VI systolic murmur at the LUSB ABD: Flat, positive bowel sounds normal in frequency in pitch, no bruits, no rebound, no guarding, no midline pulsatile mass, no hepatomegaly, no splenomegaly EXT:  2 plus pulses throughout, no edema, no cyanosis no clubbing SKIN:  No rashes no nodules NEURO:  Cranial nerves II through XII grossly intact, motor grossly intact throughout PSYCH:  Cognitively intact, oriented to person place and time   ASSESSMENT:    1. Type 1 dissection of ascending aorta   2. Essential hypertension   3. Pure hypercholesterolemia    PLAN:    Type 1 dissection of ascending aorta S/p AAA repair for acute dissection.  She is slowly recovering.  BP well-controlled.  It is unclear why she had an aortic dissection.  She does not have many risk factors.  She had very mild hypertension that was managed on amlodipine alone.  She did not have any other physical exam findings consistent with  connective tissue disease.  Continue beta blocker and statin.  Essential hypertension Blood pressures well-controlled.  She is going to start spacing her carvedilol out more evenly to avoid high blood pressures before her next morning dose.  Continue amlodipine, carvedilol, and losartan.  Pure hypercholesterolemia She was started on atorvastatin in the hospital.  She will need repeat lipids and a CMP at follow-up.   Disposition: FU with Clair Alfieri C. Oval Linsey, MD, [facility] in 2-3 months.  Medication Adjustments/Labs and Tests Ordered: Current medicines are reviewed at length with the patient today.  Concerns regarding medicines are outlined above.   No orders of the defined types were placed in this encounter.  No orders of the defined types were placed in this encounter.  Patient Instructions  Medication Instructions:  Your physician recommends that you continue on your current medications as directed. Please refer to the Current Medication list given to you today.   *If you need a refill on your cardiac medications before your next appointment, please call your pharmacy*  Lab Work: NONE   Testing/Procedures: NONE   Follow-Up: At [facility], you and your health needs are our priority.  As part of our continuing mission to provide you with exceptional heart care, we have created designated Provider Care Teams.  These Care Teams include your primary Cardiologist (physician) and Advanced Practice Providers (APPs -  Physician Assistants and Nurse Practitioners) who all work together to provide you with the care you need, when you need it.  We recommend signing up for the patient portal called "MyChart".  Sign up information is provided on this After Visit Summary.  MyChart is used to connect with patients for Virtual Visits (Telemedicine).  Patients are able to view lab/test results, encounter notes, upcoming appointments, etc.  Non-urgent messages can be sent to your provider as well.    To learn more about what you can do with MyChart, go to [email or web address].    Your next appointment:   6 month(s)  The format for your next appointment:   In Person  Provider:   Skeet Latch, MD      [facility],acting as a scribe for Skeet Latch, MD.,have documented all relevant documentation on the behalf of Jaliah Foody  Oval Linsey, MD,as directed by  Skeet Latch, MD while in the presence of Skeet Latch, MD.  I, [place] Oval Linsey, MD have reviewed all documentation for this visit.  The documentation of the exam, diagnosis, procedures, and orders on 02/11/2021 are all accurate and complete.   Signed, Skeet Latch, MD  02/11/2021 6:07 PM    [place]

## 2021-02-11 NOTE — Assessment & Plan Note (Addendum)
S/p AAA repair for acute dissection.  She is slowly recovering.  BP well-controlled.  It is unclear why she had an aortic dissection.  She does not have many risk factors.  She had very mild hypertension that was managed on amlodipine alone.  She did not have any other physical exam findings consistent with connective tissue disease.  Continue beta blocker and statin.

## 2021-02-11 NOTE — Assessment & Plan Note (Signed)
She was started on atorvastatin in the hospital.  She will need repeat lipids and a CMP at follow-up.

## 2021-02-11 NOTE — Patient Instructions (Signed)
Medication Instructions:  Your physician recommends that you continue on your current medications as directed. Please refer to the Current Medication list given to you today.   *If you need a refill on your cardiac medications before your next appointment, please call your pharmacy*  Lab Work: NONE  Testing/Procedures: NONE  Follow-Up: At [facility], you and your health needs are our priority.  As part of our continuing mission to provide you with exceptional heart care, we have created designated Provider Care Teams.  These Care Teams include your primary Cardiologist (physician) and Advanced Practice Providers (APPs -  Physician Assistants and Nurse Practitioners) who all work together to provide you with the care you need, when you need it.  We recommend signing up for the patient portal called "MyChart".  Sign up information is provided on this After Visit Summary.  MyChart is used to connect with patients for Virtual Visits (Telemedicine).  Patients are able to view lab/test results, encounter notes, upcoming appointments, etc.  Non-urgent messages can be sent to your provider as well.   To learn more about what you can do with MyChart, go to [email or web address].    Your next appointment:   6 month(s)  The format for your next appointment:   In Person  Provider:   Tiffany Centre, MD            

## 2021-02-16 ENCOUNTER — Other Ambulatory Visit: Payer: Self-pay | Admitting: Physician Assistant

## 2021-02-17 ENCOUNTER — Ambulatory Visit (HOSPITAL_COMMUNITY): Payer: BC Managed Care – PPO | Attending: Cardiology

## 2021-02-17 ENCOUNTER — Other Ambulatory Visit: Payer: Self-pay | Admitting: Physician Assistant

## 2021-02-17 ENCOUNTER — Other Ambulatory Visit: Payer: Self-pay

## 2021-02-17 DIAGNOSIS — I5033 Acute on chronic diastolic (congestive) heart failure: Secondary | ICD-10-CM | POA: Insufficient documentation

## 2021-02-17 LAB — ECHOCARDIOGRAM COMPLETE
AR max vel: 2.08 cm2
AV Area VTI: 2.1 cm2
AV Area mean vel: 2.21 cm2
AV Mean grad: 5 mmHg
AV Peak grad: 9.1 mmHg
Ao pk vel: 1.51 m/s
Area-P 1/2: 4.1 cm2
S' Lateral: 2.4 cm

## 2021-02-17 NOTE — Progress Notes (Unsigned)
CRITICAL VALUE STICKER  RECEIVER (on-site recipient of call):Dr. Pemberton  DATE & TIME NOTIFIED: 02/17/21 1512  MESSENGER (representative from lab): Marygrace Drought  MD NOTIFIED: Dr. Johney Frame  TIME OF NOTIFICATION:1500  RESPONSE:  Patient discharged per Dr. Johney Frame.

## 2021-02-18 ENCOUNTER — Other Ambulatory Visit: Payer: Self-pay

## 2021-02-18 DIAGNOSIS — I7101 Dissection of ascending aorta: Secondary | ICD-10-CM

## 2021-02-25 ENCOUNTER — Other Ambulatory Visit: Payer: Self-pay | Admitting: Student

## 2021-02-25 ENCOUNTER — Other Ambulatory Visit: Payer: Self-pay | Admitting: Thoracic Surgery (Cardiothoracic Vascular Surgery)

## 2021-02-25 DIAGNOSIS — I7121 Aneurysm of the ascending aorta, without rupture: Secondary | ICD-10-CM

## 2021-02-26 ENCOUNTER — Ambulatory Visit (INDEPENDENT_AMBULATORY_CARE_PROVIDER_SITE_OTHER): Payer: Self-pay | Admitting: Thoracic Surgery (Cardiothoracic Vascular Surgery)

## 2021-02-26 ENCOUNTER — Other Ambulatory Visit: Payer: Self-pay | Admitting: Thoracic Surgery (Cardiothoracic Vascular Surgery)

## 2021-02-26 ENCOUNTER — Ambulatory Visit
Admission: RE | Admit: 2021-02-26 | Discharge: 2021-02-26 | Disposition: A | Discharge status: Home / Self Care | Payer: BC Managed Care – PPO | Source: Ambulatory Visit | Attending: Thoracic Surgery (Cardiothoracic Vascular Surgery) | Admitting: Thoracic Surgery (Cardiothoracic Vascular Surgery)

## 2021-02-26 ENCOUNTER — Other Ambulatory Visit: Payer: Self-pay

## 2021-02-26 VITALS — BP 125/85 | HR 78 | Resp 20 | Ht 70.0 in | Wt 190.0 lb

## 2021-02-26 DIAGNOSIS — I7101 Dissection of ascending aorta: Secondary | ICD-10-CM

## 2021-02-26 DIAGNOSIS — Z09 Encounter for follow-up examination after completed treatment for conditions other than malignant neoplasm: Secondary | ICD-10-CM

## 2021-02-26 DIAGNOSIS — I7121 Aneurysm of the ascending aorta, without rupture: Secondary | ICD-10-CM

## 2021-02-26 MED ORDER — ASPIRIN 81 MG PO TBEC: 81.0000 mg | DELAYED_RELEASE_TABLET | Freq: Every day | ORAL | 1 refills | Status: DC

## 2021-03-01 ENCOUNTER — Telehealth: Payer: Self-pay

## 2021-03-01 ENCOUNTER — Other Ambulatory Visit: Payer: Self-pay | Admitting: *Deleted

## 2021-03-01 NOTE — Progress Notes (Signed)
° °   °  [address]       [place] [address]             [phone]        Catherine Mcdonald [employer] Medical Record #[id number] Date of Birth: March 28, 1970  Referring: Pixie Casino, MD Primary Care: Robyne Peers, MD Primary Cardiologist:Kenneth Wells Guiles, MD  Reason for visit:   follow-up  History of Present Illness:     Ms. Bejarano comes in for 1 month follow-up appointment.  She is status post type a dissection repair.  Overall she is doing well.  She denies any chest pain or incisional pain.  Her blood pressures been well controlled.  Physical Exam: BP 125/85    Pulse 78    Resp 20    Ht 5\' 10"  (1.778 m)    Wt 190 lb (86.2 kg)    LMP 05/31/2012    SpO2 97% Comment: RA   BMI 27.26 kg/m   Alert NAD Incision clean.  Sternum stable Abdomen  ND No peripheral edema   Diagnostic Studies & Laboratory data: CXR: Clear     Assessment / Plan:   50 year old female status post type a dissection repair.  Overall doing well.  We will order a CT aortogram given that she did not have complete view of the distal extent of the dissection.  She will then follow-up in 6 months with a repeat scan.   Catherine Mcdonald 03/01/2021 12:43 PM

## 2021-03-01 NOTE — Telephone Encounter (Signed)
-----   Message from Garner Nash, DO sent at 02/26/2021 11:54 AM EST ----- Regarding: Appt for thora  Please place on scheduled for office based thoracentesis and have all equipment/consent forms completed. Ok to use any held slot on 28,29,30th   Thanks  BLI     ----- Message ----- From: Synthia Innocent Sent: 02/26/2021  11:53 AM EST To: Garner Nash, DO  Good Morning!  Dr Kipp Brood placed referral for this patient, needing a thoracentesis.  Thanks

## 2021-03-01 NOTE — Progress Notes (Signed)
Cardiac rehab referral sent to [facility] per patient and MD request.

## 2021-03-01 NOTE — Telephone Encounter (Signed)
Called and spoke with patient to get her scheduled for OV with Dr. Valeta Harms. She has now been scheduled. Nothing further needed at this time.  Next Appt With Pulmonology ([place], DO) 03/03/2021 at 3:00 PM

## 2021-03-03 ENCOUNTER — Other Ambulatory Visit (HOSPITAL_COMMUNITY)
Admission: RE | Admit: 2021-03-03 | Discharge: 2021-03-03 | Disposition: A | Discharge status: Home / Self Care | Payer: BC Managed Care – PPO | Source: Ambulatory Visit | Attending: Pulmonary Disease | Admitting: Pulmonary Disease

## 2021-03-03 ENCOUNTER — Other Ambulatory Visit: Payer: Self-pay

## 2021-03-03 ENCOUNTER — Ambulatory Visit: Payer: BC Managed Care – PPO | Admitting: Pulmonary Disease

## 2021-03-03 ENCOUNTER — Encounter: Payer: Self-pay | Admitting: Pulmonary Disease

## 2021-03-03 ENCOUNTER — Ambulatory Visit (INDEPENDENT_AMBULATORY_CARE_PROVIDER_SITE_OTHER): Payer: BC Managed Care – PPO

## 2021-03-03 VITALS — BP 116/68 | HR 90 | Temp 97.6°F | Ht 70.0 in | Wt 192.4 lb

## 2021-03-03 DIAGNOSIS — I7101 Dissection of ascending aorta: Secondary | ICD-10-CM | POA: Diagnosis not present

## 2021-03-03 DIAGNOSIS — J90 Pleural effusion, not elsewhere classified: Secondary | ICD-10-CM

## 2021-03-03 LAB — ALBUMIN, PLEURAL OR PERITONEAL FLUID: Albumin, Fluid: 2.5 g/dL

## 2021-03-03 LAB — LACTATE DEHYDROGENASE, PLEURAL OR PERITONEAL FLUID: LD, Fluid: 127 U/L — ABNORMAL HIGH (ref 3–23)

## 2021-03-03 LAB — GLUCOSE, PLEURAL OR PERITONEAL FLUID: Glucose, Fluid: 113 mg/dL

## 2021-03-03 LAB — BODY FLUID CELL COUNT WITH DIFFERENTIAL
Eos, Fluid: 2 %
Lymphs, Fluid: 45 %
Monocyte-Macrophage-Serous Fluid: 50 % (ref 50–90)
Neutrophil Count, Fluid: 3 % (ref 0–25)
Total Nucleated Cell Count, Fluid: 671 cu mm (ref 0–1000)

## 2021-03-03 LAB — PROTEIN, PLEURAL OR PERITONEAL FLUID: Total protein, fluid: 4.4 g/dL

## 2021-03-03 NOTE — Progress Notes (Signed)
Thoracentesis  Procedure Note  Catherine Mcdonald  [id number]  March 26, 1970  Date:03/03/21  Time:4:04 PM   Provider Performing:Ojani Berenson L Lavere Shinsky   Procedure: Thoracentesis with imaging guidance ([id number])  Indication(s) Pleural Effusion  Consent Risks of the procedure as well as the alternatives and risks of each were explained to the patient and/or caregiver.  Consent for the procedure was obtained and is signed in the bedside chart  Anesthesia Topical only with 1% lidocaine   Time Out Verified patient identification, verified procedure, site/side was marked, verified correct patient position, special equipment/implants available, medications/allergies/relevant history reviewed, required imaging and test results available.  Sterile Technique Maximal sterile technique including full sterile barrier drape, hand hygiene, sterile gown, sterile gloves, mask, hair covering, sterile ultrasound probe cover (if used).  Procedure Description Ultrasound was used to identify appropriate pleural anatomy for placement and overlying skin marked.  Area of drainage cleaned and draped in sterile fashion. Lidocaine was used to anesthetize the skin and subcutaneous tissue.  800 cc's of serosanginous appearing fluid was drained from the right pleural space. Catheter then removed and bandaid applied to site.  Complications/Tolerance None; patient tolerated the procedure well. Chest X-ray is ordered to confirm no post-procedural complication.  EBL Minimal  Specimen(s) Pleural fluid  Garner Nash, DO [facility] 03/03/2021 4:04 PM      Right sided pleural effusion

## 2021-03-03 NOTE — Patient Instructions (Signed)
Thank you for visiting Dr. Valeta Harms at [facility]. Today we recommend the following:  Orders Placed This Encounter  Procedures   DG Chest 2 View   Body fluid cell count with differential   Lactate dehydrogenase   Protein, total   Glucose, random   Albumin   F/u with Dr. Kipp Brood as scheduled.   Return if symptoms worsen or fail to improve.    Please do your part to reduce the spread of COVID-19.

## 2021-03-03 NOTE — Progress Notes (Signed)
Synopsis: Referred in December 2022 for pleural effusion by Robyne Peers, MD  Subjective:   PATIENT ID: Catherine Mcdonald GENDER: female DOB: 1970/11/19, MRN: [id number]  Chief Complaint  Patient presents with   Consult    thoracentesis    This is a 50 year old female, past medical history of hypertension, hyperlipidemia.Patient referred for evaluation of pleural effusion.  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 Kipp Brood.  Her postop chest x-ray on 02/26/2021 for follow-up in the clinic revealed a persistent right-sided pleural effusion and a small effusion on the left.  Patient was referred for evaluation for thoracentesis in the office.   Past Medical History:  Diagnosis Date   Allergy    Anemia    had ekg/stress test/echo done in March at [facility] d/t symptoms of anemia-cardiac r/o per pt-records requested   Arthritis    Essential hypertension 02/11/2021   Headache(784.0)    History of blood transfusion 05/2012   [facility]   Hyperlipidemia    Hypertension    meds d/c about 1 year ago after starting exercise program with b/p controlled-pcp aware   Menorrhagia 03/01/2012   Pure hypercholesterolemia 02/11/2021   Seizures ([place])    as an infant with high fevers   Sickle cell trait ([place])      Family History  Problem Relation Age of Onset   Hyperlipidemia Mother    Hypertension Mother    Diabetes Maternal Grandmother    Hypertension Maternal Grandmother    Colon cancer Neg Hx    Esophageal cancer Neg Hx    Rectal cancer Neg Hx    Stomach cancer Neg Hx      Past Surgical History:  Procedure Laterality Date   CESAREAN SECTION     TEE WITHOUT CARDIOVERSION N/A 01/08/2021   Procedure: TRANSESOPHAGEAL ECHOCARDIOGRAM (TEE);  Surgeon: Lajuana Matte, MD;  Location: [facility];  Service: Open Heart Surgery;  Laterality: N/A;   THORACIC AORTIC ANEURYSM REPAIR N/A 01/08/2021   Procedure: Repair of Acute  Ascending Thoracic Aortic Dissection Using Hemashield Platinum Graft Size 28MM;  Surgeon: Lajuana Matte, MD;  Location: [facility];  Service: Open Heart Surgery;  Laterality: N/A;   VAGINAL HYSTERECTOMY N/A 07/12/2012   Procedure: HYSTERECTOMY VAGINAL;  Surgeon: Ena Dawley, MD;  Location: [facility];  Service: Gynecology;  Laterality: N/A;   WISDOM TOOTH EXTRACTION      Social History   Socioeconomic History   Marital status: Married    Spouse name: Not on file   Number of children: Not on file   Years of education: Not on file   Highest education level: Not on file  Occupational History   Not on file  Tobacco Use   Smoking status: Never   Smokeless tobacco: Never  Substance and Sexual Activity   Alcohol use: Yes    Comment: RARE   Drug use: No   Sexual activity: Yes    Birth control/protection: Condom  Other Topics Concern   Not on file  Social History Narrative   Not on file   Social Determinants of Health   Financial Resource Strain: Not on file  Food Insecurity: Not on file  Transportation Needs: Not on file  Physical Activity: Not on file  Stress: Not on file  Social Connections: Not on file  Intimate Partner Violence: Not on file     No Known Allergies   Outpatient Medications Prior to Visit  Medication Sig Dispense Refill  amLODipine (NORVASC) 5 MG tablet Take 1 tablet (5 mg total) by mouth daily. 180 tablet 3   aspirin 81 MG EC tablet Take 1 tablet (81 mg total) by mouth daily. 30 tablet 1   atorvastatin (LIPITOR) 80 MG tablet Take 1 tablet (80 mg total) by mouth daily. 30 tablet 1   carvedilol (COREG) 6.25 MG tablet Take 1 tablet (6.25 mg total) by mouth 2 (two) times daily. 180 tablet 1   losartan (COZAAR) 25 MG tablet Take 25 mg by mouth daily.     psyllium (METAMUCIL) 58.6 % powder Take 1 packet by mouth daily.     No facility-administered medications prior to visit.    Review of Systems  Constitutional:  Negative for chills, fever,  malaise/fatigue and weight loss.  HENT:  Negative for hearing loss, sore throat and tinnitus.   Eyes:  Negative for blurred vision and double vision.  Respiratory:  Negative for cough, hemoptysis, sputum production, shortness of breath, wheezing and stridor.   Cardiovascular:  Negative for chest pain, palpitations, orthopnea, leg swelling and PND.  Gastrointestinal:  Negative for abdominal pain, constipation, diarrhea, heartburn, nausea and vomiting.  Genitourinary:  Negative for dysuria, hematuria and urgency.  Musculoskeletal:  Negative for joint pain and myalgias.  Skin:  Negative for itching and rash.  Neurological:  Negative for dizziness, tingling, weakness and headaches.  Endo/Heme/Allergies:  Negative for environmental allergies. Does not bruise/bleed easily.  Psychiatric/Behavioral:  Negative for depression. The patient is not nervous/anxious and does not have insomnia.   All other systems reviewed and are negative.   Objective:  Physical Exam Vitals reviewed.  Constitutional:      General: She is not in acute distress.    Appearance: She is well-developed.  HENT:     Head: Normocephalic and atraumatic.  Eyes:     General: No scleral icterus.    Conjunctiva/sclera: Conjunctivae normal.     Pupils: Pupils are equal, round, and reactive to light.  Neck:     Vascular: No JVD.     Trachea: No tracheal deviation.  Cardiovascular:     Rate and Rhythm: Normal rate and regular rhythm.     Heart sounds: Normal heart sounds. No murmur heard. Pulmonary:     Effort: Pulmonary effort is normal. No tachypnea, accessory muscle usage or respiratory distress.     Breath sounds: No stridor. No wheezing, rhonchi or rales.     Comments: Diminshed breath sounds BL  Abdominal:     General: Bowel sounds are normal. There is no distension.     Palpations: Abdomen is soft.     Tenderness: There is no abdominal tenderness.  Musculoskeletal:        General: No tenderness.     Cervical back:  Neck supple.  Lymphadenopathy:     Cervical: No cervical adenopathy.  Skin:    General: Skin is warm and dry.     Capillary Refill: Capillary refill takes less than 2 seconds.     Findings: No rash.  Neurological:     Mental Status: She is alert and oriented to person, place, and time.  Psychiatric:        Behavior: Behavior normal.     Vitals:   03/03/21 1511  BP: 116/68  Pulse: 90  Temp: 97.6 F (36.4 C)  TempSrc: Oral  SpO2: 95%  Weight: 192 lb 6.4 oz (87.3 kg)  Height: 5\' 10"  (1.778 m)   95% on RA BMI Readings from Last 3 Encounters:  03/03/21 27.61  kg/m  02/26/21 27.26 kg/m  02/11/21 28.90 kg/m   Wt Readings from Last 3 Encounters:  03/03/21 192 lb 6.4 oz (87.3 kg)  02/26/21 190 lb (86.2 kg)  02/11/21 201 lb 6.4 oz (91.4 kg)     CBC    Component Value Date/Time   WBC 10.9 (H) 01/29/2021 1606   WBC 16.1 (H) 01/17/2021 0534   RBC 4.14 01/29/2021 1606   RBC 3.02 (L) 01/17/2021 0534   HGB 10.5 (L) 01/29/2021 1606   HCT 33.4 (L) 01/29/2021 1606   PLT 638 (H) 01/29/2021 1606   MCV 81 01/29/2021 1606   MCH 25.4 (L) 01/29/2021 1606   MCH 26.5 01/17/2021 0534   MCHC 31.4 (L) 01/29/2021 1606   MCHC 30.9 01/17/2021 0534   RDW 17.0 (H) 01/29/2021 1606   LYMPHSABS 1.6 01/08/2021 1427   MONOABS 0.6 01/08/2021 1427   EOSABS 0.1 01/08/2021 1427   BASOSABS 0.0 01/08/2021 1427    Chest Imaging: Chest x-ray December 2022: Right-sided pleural effusion The patient's images have been independently reviewed by me.    Pulmonary Functions Testing Results: No flowsheet data found.  FeNO:   Pathology:   Echocardiogram:   Heart Catheterization:     Assessment & Plan:     ICD-10-CM   1. Pleural effusion, right  J90 DG Chest 2 View    2. Type 1 dissection of ascending aorta  I71.010       Discussion:  Patient has a postop right-sided pleural effusion status post type I dissection repair by Dr. Kipp Brood.  Referred today for evaluation of pleural  effusion consideration for thoracentesis.  Plan: Today in the office we discussed the risk benefits and alternatives of proceeding with thoracentesis. Patient is agreeable to proceed at this time. We discussed the risk of bleeding and pneumothorax.  She is on no blood thinners. Please see separate procedure note regarding thoracentesis.   Current Outpatient Medications:    amLODipine (NORVASC) 5 MG tablet, Take 1 tablet (5 mg total) by mouth daily., Disp: 180 tablet, Rfl: 3   aspirin 81 MG EC tablet, Take 1 tablet (81 mg total) by mouth daily., Disp: 30 tablet, Rfl: 1   atorvastatin (LIPITOR) 80 MG tablet, Take 1 tablet (80 mg total) by mouth daily., Disp: 30 tablet, Rfl: 1   carvedilol (COREG) 6.25 MG tablet, Take 1 tablet (6.25 mg total) by mouth 2 (two) times daily., Disp: 180 tablet, Rfl: 1   losartan (COZAAR) 25 MG tablet, Take 25 mg by mouth daily., Disp: , Rfl:    psyllium (METAMUCIL) 58.6 % powder, Take 1 packet by mouth daily., Disp: , Rfl:   I spent 45 minutes dedicated to the care of this patient on the date of this encounter to include pre-visit review of records, face-to-face time with the patient discussing conditions above, post visit ordering of testing, clinical documentation with the electronic health record, making appropriate referrals as documented, and communicating necessary findings to members of the patients care team.    Garner Nash, [facility] 03/03/2021 4:02 PM

## 2021-03-04 ENCOUNTER — Telehealth: Payer: Self-pay | Admitting: Pulmonary Disease

## 2021-03-04 NOTE — Telephone Encounter (Signed)
I called Kim back at the hospital and she wanted to let me know that her co worker had replaced the order for pleural fluid that was blood. She wanted to let us know that we would need to place the side the fluid come off such as right or left. Nothing further needed.

## 2021-03-07 NOTE — Progress Notes (Addendum)
Cardiology Office Note:    Date:  03/16/2021   ID:  Catherine Mcdonald, DOB 11-22-1970, MRN [id number]  PCP:  Robyne Peers, MD  Cardiologist:  Skeet Latch, MD  Electrophysiologist:  None   Referring MD: Robyne Peers, MD   Chief Complaint: follow-up of aortic dissection and hypertension  History of Present Illness:    Catherine Mcdonald is a 50 y.o. female with a history of thoracic aortic aneurysm with recent type I dissection on 01/08/2021 s/p repair with graft as well as aortic valve resuspension and replacement of the ascending aorta/arch, right pleural effusion s/p thoracentesis on 03/03/2021, hypertension, hyperlipidemia, and febrile seizures as an infant who is followed by Dr. Debara Mcdonald and presents today for follow-up of aortic dissection and hypertension.  Patient was recently admitted from 01/08/2021 to 01/16/2021 with acute thoracic aortic dissection after presenting with acute onset of chest/left-sided neck pain.  She was initially ruled in for a non-STEMI with ST depressions in inferior leads.  However, when she was taken to the CT scanner, she was found to have a Type A dissection stenting from the aortic root through the diaphragmatic hiatus. Surgery was consulted consulted and she underwent emergent surgery with Dr. Kipp Brood where she underwent aortic dissection repair with a 8 mm graft as well as aortic valve resuspension and replacement of the ascending aorta and arch.  She tolerated the procedure well. Post-op course was relatively unremarkable.  Patient was seen by me on 01/29/2021 for follow-up at which time she was recovering slowly and reported shortness of breath with exertion as well as some orthopnea and lower extremity edema. She did have evidence of volume overload on exam. BP was also markedly elevated. BNP was mildly elevated. Chest x-ray was elevated and showed bilateral pleural effusion (right > left). She was restarted on Lasix for 1 week and multiple  medications changes were made in regards to help get better control of her BP. Echo was ordered to assess LV function and murmur on exam. Zio monitor was also ordered given reports of palpitations. Echo showed LVEF of 65-75% with moderate LVH and small pericardial effusion but no significant valvular disease. Also showed a dissection flap in the abdominal aorta with flow into the false lumen. CTA of chest/abdomen/pelvis was ordered for further evaluation of this.  Zio Monitor showed underlying sinus rhythm with 25 episodes of SVT which likely explain recurrent episodes of palpitations.  She was seen by Dr. Oval Linsey in her [facility] on 02/11/2021 at which time she continued to report severe dyspnea with minimal exertion and lower extremity edema. BP was still mildly elevated especially in the morning. Patient was advised to start spacing out her Coreg more evenly to avoid high BP in the mornings.  Patient was seen by Dr. Kipp Brood on 02/26/2021 at which he felt like patient was overall doing well. Chest x-ray on that day showed persistent right pleural effusion so she was referred to Pulmonology for thoracentesis. Patient underwent right thoracentesis on 03/03/2021 with remove of 800 cc of fluid.  Chest/Abdominal/Pelvic CTA  on 03/12/2021 showed the residual dissection flap extends from just beyond the surgical repair to the left iliac artery, a small focus of contrast lateral to the non coronary cusp of the aortic valve, proximal to the anastomosis at the sinotubular junction, of uncertain significance and a linear contrast along the caudal aortic arch, which abuts the distal anastomosis proximally and is continuous with the false channel distally (may represent entry flow into the false  channel secondary to a small branch but leakage of an anastomotic pseudoaneurysm cannot be excluded. 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   Patient presents today for follow-up.  Here with husband.  Patient is doing much better since I last saw her.  She states her breathing is better following recent thoracentesis.  For the first week following this, she was still having trouble taking a deep breath but this is now improved.  Her husband has noted significant improvement in her breathing and states she no longer gets short of breath walking to a different room in the house.  She does have some chest discomfort at rest.  She describes occasional right-sided chest discomfort under her right breast as well as a pulling/tugging sensation when she takes a deep breath.  She was able to go on a 1 mile walk the other day with with no chest pain.  This does not sound like angina. Suspect it is musculoskeletal in nature. She also notes right shoulder/back pain since her surgery which is reproducible with palpation on exam today. She is still having some occasional palpitations that she describes as "fluttering" but denies any lightheadedness, dizziness, syncope.  No lower extremity edema.  She notes some tingling in her legs which she has had since the time of her surgery.  BP 130/80 in the office today. She states BP as been well controlled at home. Typically 130/80 in the morning and then systolic BP in the 458K to 120s later in the day.  Reviewed results of recent CTA with patient and husband today.  Past Medical History:  Diagnosis Date   Allergy    Anemia    had ekg/stress test/echo done in March at [facility] d/t symptoms of anemia-cardiac r/o per pt-records requested   Arthritis    Essential hypertension 02/11/2021   Headache(784.0)    History of blood transfusion 05/2012   [facility]   Hyperlipidemia    Hypertension    meds d/c about 1 year ago after starting exercise program with b/p controlled-pcp aware   Menorrhagia 03/01/2012   Pure hypercholesterolemia 02/11/2021    Seizures ([place])    as an infant with high fevers   Sickle cell trait ([place])     Past Surgical History:  Procedure Laterality Date   CESAREAN SECTION     TEE WITHOUT CARDIOVERSION N/A 01/08/2021   Procedure: TRANSESOPHAGEAL ECHOCARDIOGRAM (TEE);  Surgeon: Lajuana Matte, MD;  Location: [facility];  Service: Open Heart Surgery;  Laterality: N/A;   THORACIC AORTIC ANEURYSM REPAIR N/A 01/08/2021   Procedure: Repair of Acute Ascending Thoracic Aortic Dissection Using Hemashield Platinum Graft Size 28MM;  Surgeon: Lajuana Matte, MD;  Location: [facility];  Service: Open Heart Surgery;  Laterality: N/A;   VAGINAL HYSTERECTOMY N/A 07/12/2012   Procedure: HYSTERECTOMY VAGINAL;  Surgeon: Ena Dawley, MD;  Location: [facility];  Service: Gynecology;  Laterality: N/A;   WISDOM TOOTH EXTRACTION      Current Medications: Current Meds  Medication Sig   amLODipine (NORVASC) 5 MG tablet Take 1 tablet (5 mg total) by mouth daily.   aspirin 81 MG EC tablet Take 1 tablet (81 mg total) by mouth daily.   atorvastatin (LIPITOR) 80 MG tablet Take 1 tablet (80 mg total) by mouth daily.   losartan (COZAAR) 25 MG tablet Take 25 mg by mouth daily.   psyllium (METAMUCIL) 58.6 % powder Take 1 packet  by mouth daily.   [DISCONTINUED] carvedilol (COREG) 6.25 MG tablet Take 1 tablet (6.25 mg total) by mouth 2 (two) times daily.     Allergies:   Patient has no known allergies.   Social History   Socioeconomic History   Marital status: Married    Spouse name: Not on file   Number of children: Not on file   Years of education: Not on file   Highest education level: Not on file  Occupational History   Not on file  Tobacco Use   Smoking status: Never   Smokeless tobacco: Never  Substance and Sexual Activity   Alcohol use: Yes    Comment: RARE   Drug use: No   Sexual activity: Yes    Birth control/protection: Condom  Other Topics Concern   Not on file  Social History Narrative   Not on file   Social  Determinants of Health   Financial Resource Strain: Not on file  Food Insecurity: Not on file  Transportation Needs: Not on file  Physical Activity: Not on file  Stress: Not on file  Social Connections: Not on file     Family History: The patient's family history includes Diabetes in her maternal grandmother; Hyperlipidemia in her mother; Hypertension in her maternal grandmother and mother. There is no history of Colon cancer, Esophageal cancer, Rectal cancer, or Stomach cancer.  ROS:   Please see the history of present illness.     EKGs/Labs/Other Studies Reviewed:    The following studies were reviewed today:  Echocardiogram 02/17/2021: Impressions: 1. The patient has a known history of type A aortic dissection s/p repair  with 92mm Hemashield Platinum graft and aortic valve resuspension.   2. There is a dissection flap well visualized in the abdominal aorta with  flow into the false lumen detected with color doppler. Will likely need  CTA imaging (prior CT did not include abdominal imaging) to evaluate  further.   3. Left ventricular ejection fraction, by estimation, is 65 to 70%. The  left ventricle has normal function. The left ventricle has no regional  wall motion abnormalities. There is moderate concentric left ventricular  hypertrophy. Left ventricular  diastolic parameters were normal.   4. Right ventricular systolic function is normal. The right ventricular  size is normal.   5. A small pericardial effusion is present. The pericardial effusion is  mostlly localized around the RA and RV. There is no evidence of cardiac  tamponade.   6. The mitral valve is normal in structure. Trivial mitral valve  regurgitation.   7. The aortic valve is tricuspid. Aortic valve regurgitation is trivial.  No aortic stenosis is present.   8. The inferior vena cava is normal in size with greater than 50%  respiratory variability, suggesting right atrial pressure of 3 mmHg.   _______________  Elwyn Reach Monitor 02/02/2021 to 02/09/2021: Monitor shows sinus rhythm - there were 25 episodes of SVT, sometimes with rates into the 150-180 bpm range, which likely explain recurrent palpitations.  EKG:  EKG not ordered today.  Recent Labs: 01/08/2021: ALT 14 01/10/2021: Magnesium 2.9 01/29/2021: BNP 183.9 03/09/2021: BUN 10; Creatinine, Ser 0.91; Hemoglobin 12.1; Platelets 467; Potassium 4.1; Sodium 142  Recent Lipid Panel    Component Value Date/Time   CHOL 253 (H) 01/08/2021 1435   TRIG 48 01/08/2021 1435   HDL 77 01/08/2021 1435   CHOLHDL 3.3 01/08/2021 1435   VLDL 10 01/08/2021 1435   LDLCALC 166 (H) 01/08/2021 1435    Physical Exam:  Vital Signs: BP 130/80 (BP Location: Left Arm, Patient Position: Sitting, Cuff Size: Normal)    Pulse 86    Resp 20    Ht 5\' 10"  (1.778 m)    Wt 189 lb (85.7 kg)    LMP 05/31/2012    SpO2 99%    BMI 27.12 kg/m     Wt Readings from Last 3 Encounters:  03/16/21 189 lb (85.7 kg)  03/03/21 192 lb 6.4 oz (87.3 kg)  02/26/21 190 lb (86.2 kg)     General: 50 y.o. African-American female in no acute distress. HEENT: Normocephalic and atraumatic. Sclera clear.  Neck: Supple. No JVD. Heart: RRR. Distinct S1 and S2. Soft murmur noted but no rubs or gallops Radial, posterior tibial, and distal pedal pulses 2+ and equal bilaterally. Lungs: No increased work of breathing. Decreased breath sounds in right lower lobe but otherwise clear to ausculation bilaterally. No wheezes, rhonchi, or rales.  Abdomen: Soft, non-distended, and non-tender to palpation.  Extremities: No lower extremity edema.    Skin: Warm and dry. Neuro: Alert and oriented x3. No focal deficits. Psych: Normal affect. Responds appropriately.  Assessment:    1. H/O repair of dissecting aneurysm of ascending thoracic aorta   2. Recurrent right pleural effusion   3. Primary hypertension   4. Hyperlipidemia, unspecified hyperlipidemia type   5. Right shoulder pain,  unspecified chronicity     Plan:    Thoracic Ascending Aortic Aneurysm with Recent Type A Dissection s/p Repair Patient was recently admitted with thoracic ascending aortic aneurysm with acute Type A dissection. She underwent emergent repair with Dr. Kipp Brood with a 8 mm graft as well as aortic valve resuspension and replacement of the ascending aorta and arch.  Recent CTA on 03/12/2021 showed residual dissection flap extends from just beyond the surgical repair to the left iliac artery, a small focus of contrast lateral to the non coronary cusp of the aortic valve, proximal to the anastomosis at the sinotubular junction, of uncertain significance and a linear contrast along the caudal aortic arch, which abuts the distal anastomosis proximally and is continuous with the false channel distally (may represent entry flow into the false channel secondary to a small branch but leakage of an anastomotic pseudoaneurysm cannot be excluded. 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 She is still having atypical chest pain that is likely musculoskeletal. No angina. Volume overload that was noted at last visit secondary to recent surgery rather than true CHF. - Continue aspirin, beta-blocker, and statin. - Follow-up with CT surgery as recommended. Will discuss recent CTA results with Dr. Kipp Brood. - Advised patient that if she were to develop sudden onset of new chest pain/back/abdomen pain, she should go to the ED immediately. - She is scheduled to go back to work soon (OK per Dr. Kipp Brood). 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 Advised patient to start slow and gradually increase activity.  Right Pleural Effusion Patient had persistent right pleural effusion following dissection repair. S/p thoracentesis by Dr. Valeta Harms on 03/03/2021 with removal of 800 cc of  serosanguinous fluid. Recent CTA on 03/12/2021 showed moderate right sided pleural effusion.  - Decreased breath sounds noted in right base but patient states shortness of breath has improved. - Advised patient to let us know if breathing worsens. May need repeat thoracentesis at some point. Will ask Dr. Kipp Brood for his recommendation.  Hypertension BP well controlled in the office today. - Current medications: Amlodipine 5mg  daily, Losartan 25mg  daily, and Coreg 6.25mg  twice daily. - Will increase Coreg to 12.5mg  twice daily for palpitations.   Palpitations Patient reported intermittent palpitations at visit in 01/2021. 1 week Zio Monitor showed underlying sinus rhythm with 25 episodes of SVT. - She is still having palpitations that she describes as a fluttering sensation. - Will increase Coreg to 12.5mg  twice daily.   Hyperlipidemia Lipid panel on 01/08/2021: Total Cholesterol 253, Triglycerides 48, HDL 77, LDL 166.  - Started on Lipitor 80mg  daily during recent admission. Continue.  - Patient is not fasting today so she will come back for lipid panel and LFTs.  Right Shoulder Pain Patient reports right shoulder pain since surgery that is reproducible on exam. Sounds musculoskeletal in nature.  - Recommended OTC Tylenol as well as topical cream such as Aspercreme   Disposition: Follow up in 3 months with Dr. Oval Linsey  ADDENDUM 04/05/2021 at 7:35PM: I heard back from Dr. Abran Duke office. He reviewed CTA - there is nothing that needs to be done about findings right now. They have ordered repeat CTA in 3 months and will follow-up with her after that.  Medication Adjustments/Labs and Tests Ordered: Current medicines are reviewed at length with the patient today.  Concerns regarding medicines are outlined above.  Orders Placed This Encounter  Procedures   Lipid panel   Hepatic function panel   Meds ordered this encounter  Medications   carvedilol (COREG) 12.5 MG tablet    Sig:  Take 1 tablet (12.5 mg total) by mouth 2 (two) times daily.    Dispense:  180 tablet    Refill:  3    Patient Instructions  Medication Instructions:  INCREASE Carvedilol (Coreg) to 12.5 mg 2 times a day *If you need a refill on your cardiac medications before your next appointment, please call your pharmacy*  Lab Work: Your physician recommends that you return for lab work in 1 month:  Fasting Lipid Panel-DO NOT EAT OR DRINK PAST MIDNIGHT. OKAY TO HAVE WATER.  Hepatic (Liver) Function Test If you have labs (blood work) drawn today and your tests are completely normal, you will receive your results only by: MyChart Message (if you have MyChart) OR A paper copy in the mail If you have any lab test that is abnormal or we need to change your treatment, we will call you to review the results.  Testing/Procedures: NONE ordered at this time of appointment   Follow-Up: At [facility], you and your health needs are our priority.  As part of our continuing mission to provide you with exceptional heart care, we have created designated Provider Care Teams.  These Care Teams include your primary Cardiologist (physician) and Advanced Practice Providers (APPs -  Physician Assistants and Nurse Practitioners) who all work together to provide you with the care you need, when you need it.  Your next appointment:   3 month(s)  The format for your next appointment:   In Person  Provider:   Skeet Latch, MD    Other Instructions     Signed, Darreld Mclean, PA-C  03/16/2021 12:47 PM    [place]

## 2021-03-09 ENCOUNTER — Other Ambulatory Visit: Payer: Self-pay

## 2021-03-09 ENCOUNTER — Encounter (HOSPITAL_BASED_OUTPATIENT_CLINIC_OR_DEPARTMENT_OTHER): Payer: Self-pay | Admitting: Cardiovascular Disease

## 2021-03-09 ENCOUNTER — Encounter: Payer: Self-pay | Admitting: Pulmonary Disease

## 2021-03-09 DIAGNOSIS — I7101 Dissection of ascending aorta: Secondary | ICD-10-CM

## 2021-03-09 LAB — CYTOLOGY - NON PAP

## 2021-03-10 ENCOUNTER — Encounter (HOSPITAL_BASED_OUTPATIENT_CLINIC_OR_DEPARTMENT_OTHER): Payer: Self-pay | Admitting: Cardiovascular Disease

## 2021-03-10 LAB — BASIC METABOLIC PANEL
BUN/Creatinine Ratio: 11 (ref 9–23)
BUN: 10 mg/dL (ref 6–24)
CO2: 24 mmol/L (ref 20–29)
Calcium: 9 mg/dL (ref 8.7–10.2)
Chloride: 102 mmol/L (ref 96–106)
Creatinine, Ser: 0.91 mg/dL (ref 0.57–1.00)
Glucose: 78 mg/dL (ref 70–99)
Potassium: 4.1 mmol/L (ref 3.5–5.2)
Sodium: 142 mmol/L (ref 134–144)
eGFR: 76 mL/min/{1.73_m2} (ref >59)

## 2021-03-10 LAB — CBC
Hematocrit: 41.3 % (ref 34.0–46.6)
Hemoglobin: 12.1 g/dL (ref 11.1–15.9)
MCH: 22.4 pg — ABNORMAL LOW (ref 26.6–33.0)
MCHC: 29.3 g/dL — ABNORMAL LOW (ref 31.5–35.7)
MCV: 77 fL — ABNORMAL LOW (ref 79–97)
Platelets: 467 10*3/uL — ABNORMAL HIGH (ref 150–450)
RBC: 5.39 x10E6/uL — ABNORMAL HIGH (ref 3.77–5.28)
RDW: 15.9 % — ABNORMAL HIGH (ref 11.7–15.4)
WBC: 6.5 10*3/uL (ref 3.4–10.8)

## 2021-03-12 ENCOUNTER — Telehealth (HOSPITAL_COMMUNITY): Payer: Self-pay | Admitting: *Deleted

## 2021-03-12 ENCOUNTER — Ambulatory Visit (INDEPENDENT_AMBULATORY_CARE_PROVIDER_SITE_OTHER)
Admission: RE | Admit: 2021-03-12 | Discharge: 2021-03-12 | Disposition: A | Discharge status: Home / Self Care | Payer: BC Managed Care – PPO | Source: Ambulatory Visit | Attending: Student | Admitting: Student

## 2021-03-12 ENCOUNTER — Other Ambulatory Visit: Payer: Self-pay

## 2021-03-12 ENCOUNTER — Telehealth: Payer: Self-pay

## 2021-03-12 DIAGNOSIS — I7101 Dissection of ascending aorta: Secondary | ICD-10-CM

## 2021-03-12 MED ORDER — IOHEXOL 350 MG/ML SOLN
100.0000 mL | Freq: Once | INTRAVENOUS | Status: AC | PRN
  Administered 2021-03-12: 100 mL via INTRAVENOUS

## 2021-03-12 NOTE — Telephone Encounter (Signed)
-----   Message from Synthia Innocent sent at 03/12/2021  9:38 AM EST ----- Good morning!  Dr Kipp Brood is requesting CTA Chest/ABD/Pel on this patient and we see you have ordered one. Will this be scheduled? Please advise.  Thanks

## 2021-03-12 NOTE — Telephone Encounter (Signed)
Called patient to let her know that a CCTA is not the right scan for her after confirming with Dr. Kipp Brood and his team.  Patient made aware centralized scheduling will be reaching out to her to schedule the correct CT scan. Patient verbalized understanding.  Gordy Clement RN Navigator Cardiac Imaging [facility] [phone] Office [phone] Cell

## 2021-03-12 NOTE — Telephone Encounter (Signed)
CT scheduled for today @330pm 

## 2021-03-12 NOTE — Telephone Encounter (Signed)
Pre-cert done and CT notified. They will call, they have an opening today

## 2021-03-16 ENCOUNTER — Ambulatory Visit (INDEPENDENT_AMBULATORY_CARE_PROVIDER_SITE_OTHER): Payer: BC Managed Care – PPO | Admitting: Student

## 2021-03-16 ENCOUNTER — Ambulatory Visit (HOSPITAL_COMMUNITY): Admission: RE | Admit: 2021-03-16 | Payer: BC Managed Care – PPO | Source: Ambulatory Visit

## 2021-03-16 ENCOUNTER — Other Ambulatory Visit: Payer: Self-pay

## 2021-03-16 ENCOUNTER — Encounter: Payer: Self-pay | Admitting: Student

## 2021-03-16 VITALS — BP 130/80 | HR 86 | Resp 20 | Ht 70.0 in | Wt 189.0 lb

## 2021-03-16 DIAGNOSIS — J90 Pleural effusion, not elsewhere classified: Secondary | ICD-10-CM | POA: Diagnosis not present

## 2021-03-16 DIAGNOSIS — I10 Essential (primary) hypertension: Secondary | ICD-10-CM

## 2021-03-16 DIAGNOSIS — E785 Hyperlipidemia, unspecified: Secondary | ICD-10-CM | POA: Diagnosis not present

## 2021-03-16 DIAGNOSIS — Z9889 Other specified postprocedural states: Secondary | ICD-10-CM

## 2021-03-16 DIAGNOSIS — M25511 Pain in right shoulder: Secondary | ICD-10-CM

## 2021-03-16 DIAGNOSIS — Z8679 Personal history of other diseases of the circulatory system: Secondary | ICD-10-CM

## 2021-03-16 MED ORDER — CARVEDILOL 12.5 MG PO TABS: 12.5000 mg | ORAL_TABLET | Freq: Two times a day (BID) | ORAL | 3 refills | Status: DC

## 2021-03-16 NOTE — Patient Instructions (Addendum)
Medication Instructions:  INCREASE Carvedilol (Coreg) to 12.5 mg 2 times a day *If you need a refill on your cardiac medications before your next appointment, please call your pharmacy*  Lab Work: Your physician recommends that you return for lab work in 1 month:  Fasting Lipid Panel-DO NOT EAT OR DRINK PAST MIDNIGHT. OKAY TO HAVE WATER.  Hepatic (Liver) Function Test If you have labs (blood work) drawn today and your tests are completely normal, you will receive your results only by: MyChart Message (if you have MyChart) OR A paper copy in the mail If you have any lab test that is abnormal or we need to change your treatment, we will call you to review the results.  Testing/Procedures: NONE ordered at this time of appointment   Follow-Up: At [facility], you and your health needs are our priority.  As part of our continuing mission to provide you with exceptional heart care, we have created designated Provider Care Teams.  These Care Teams include your primary Cardiologist (physician) and Advanced Practice Providers (APPs -  Physician Assistants and Nurse Practitioners) who all work together to provide you with the care you need, when you need it.  Your next appointment:   3 month(s)  The format for your next appointment:   In Person  Provider:   Skeet Latch, MD    Other Instructions

## 2021-03-17 ENCOUNTER — Encounter (HOSPITAL_BASED_OUTPATIENT_CLINIC_OR_DEPARTMENT_OTHER): Payer: Self-pay | Admitting: Cardiovascular Disease

## 2021-03-17 ENCOUNTER — Other Ambulatory Visit: Payer: Self-pay

## 2021-03-17 MED ORDER — CARVEDILOL 6.25 MG PO TABS: 6.2500 mg | ORAL_TABLET | Freq: Two times a day (BID) | ORAL | 3 refills | Status: DC

## 2021-03-17 NOTE — Telephone Encounter (Signed)
Please advise 

## 2021-03-17 NOTE — Telephone Encounter (Signed)
Spoke to patient she stated Carvedilol was increased to 12.5 mg twice a day yesterday.Stated she has not picked up prescription due to B/P low today 96/71.She will continue Carvedilol 6.25 mg twice a day.She will continue to monitor B/P.Advised I will send message to [facility].

## 2021-03-19 ENCOUNTER — Encounter (HOSPITAL_BASED_OUTPATIENT_CLINIC_OR_DEPARTMENT_OTHER): Payer: Self-pay | Admitting: Cardiovascular Disease

## 2021-03-19 ENCOUNTER — Other Ambulatory Visit: Payer: Self-pay | Admitting: Physician Assistant

## 2021-03-21 MED ORDER — ATORVASTATIN CALCIUM 80 MG PO TABS: 80.0000 mg | ORAL_TABLET | Freq: Every day | ORAL | 1 refills | Status: DC

## 2021-03-21 NOTE — Telephone Encounter (Signed)
See duplicate MyChart encounter. Refill sent.   Loel Dubonnet, NP

## 2021-03-22 ENCOUNTER — Telehealth (HOSPITAL_BASED_OUTPATIENT_CLINIC_OR_DEPARTMENT_OTHER): Payer: Self-pay | Admitting: *Deleted

## 2021-03-22 DIAGNOSIS — R059 Cough, unspecified: Secondary | ICD-10-CM

## 2021-03-22 DIAGNOSIS — R202 Paresthesia of skin: Secondary | ICD-10-CM

## 2021-03-22 DIAGNOSIS — R200 Anesthesia of skin: Secondary | ICD-10-CM

## 2021-03-22 NOTE — Telephone Encounter (Signed)
Patient has sent multiple mychart messages, Dr Blenda Mounts response below  Skeet Latch, MD  Meryl Crutch, RN Cc: Earvin Hansen, LPN 1. Tingling and numbness in her legs and feet is likely a neurological issue.  It is possible that a nerve was affected during her repair.  I doubt this is a vascular issue, but we can get ABIs and arterial Dopplers to be sure.  If vascular work up is negative, she can be seen by neurology.     She was advised by [facility] about going back to work at her visit on 1/3.  Cardiac rehab referral should have been placed already.  Can we follow up on this?  I suspect it is just backed up.     3. If she is still coughing and SOB, then repeat CXR to see if the fluid has recurred.   4. Paperwork with lifting restriction questions should go to Dr. Kipp Brood   5. I agree that her BP is running low.  I would not increase carvedilol to 12.5mg .  Keep it at 6.25mg  and reduce amlodipine to 2.5mg .  Keep tracking BP.   TCR   Advised patient orders placed  Discussed medication changes with patient and she stated her blood pressure has been running higher. It has on occasion SBP less than 100 but mostly above 109  SBP goes up to 140's with light house work or exertion    Will forward to Dr Oval Linsey for review

## 2021-03-23 NOTE — Telephone Encounter (Signed)
Discussed with Dr Oval Linsey and will have patient continue the Carvedilol at 6.25 mg and keep Amlodipine at the 5 mg dose Continue to monitor blood pressure and call back if any increase or SBP below 100  Advised patient, verbalized understanding

## 2021-03-24 NOTE — Telephone Encounter (Signed)
Please see updated encounter  

## 2021-03-25 NOTE — Telephone Encounter (Signed)
See 1/9 phone note

## 2021-03-26 DIAGNOSIS — Z736 Limitation of activities due to disability: Secondary | ICD-10-CM

## 2021-03-29 ENCOUNTER — Telehealth: Payer: Self-pay | Admitting: *Deleted

## 2021-03-29 NOTE — Telephone Encounter (Signed)
Work accomodation form filled out and faxed to Thurmon Fair at [phone] per patient request. Original paperwork mailed to patient's home address.

## 2021-04-05 ENCOUNTER — Encounter (HOSPITAL_BASED_OUTPATIENT_CLINIC_OR_DEPARTMENT_OTHER): Payer: Self-pay | Admitting: Cardiovascular Disease

## 2021-04-06 ENCOUNTER — Other Ambulatory Visit: Payer: Self-pay

## 2021-04-06 ENCOUNTER — Ambulatory Visit (INDEPENDENT_AMBULATORY_CARE_PROVIDER_SITE_OTHER): Payer: BC Managed Care – PPO

## 2021-04-06 DIAGNOSIS — R200 Anesthesia of skin: Secondary | ICD-10-CM | POA: Diagnosis not present

## 2021-04-06 DIAGNOSIS — R202 Paresthesia of skin: Secondary | ICD-10-CM | POA: Diagnosis not present

## 2021-04-15 ENCOUNTER — Encounter (HOSPITAL_BASED_OUTPATIENT_CLINIC_OR_DEPARTMENT_OTHER): Payer: Self-pay | Admitting: Cardiovascular Disease

## 2021-04-15 ENCOUNTER — Other Ambulatory Visit (HOSPITAL_BASED_OUTPATIENT_CLINIC_OR_DEPARTMENT_OTHER): Payer: Self-pay

## 2021-04-15 ENCOUNTER — Other Ambulatory Visit: Payer: Self-pay | Admitting: Physician Assistant

## 2021-04-15 MED ORDER — LOSARTAN POTASSIUM 25 MG PO TABS: 25.0000 mg | ORAL_TABLET | Freq: Every day | ORAL | 3 refills | Status: DC

## 2021-04-17 LAB — HEPATIC FUNCTION PANEL
ALT: 12 IU/L (ref 0–32)
AST: 15 IU/L (ref 0–40)
Albumin: 4.6 g/dL (ref 3.8–4.9)
Alkaline Phosphatase: 114 IU/L (ref 44–121)
Bilirubin Total: 0.5 mg/dL (ref 0.0–1.2)
Bilirubin, Direct: 0.12 mg/dL (ref 0.00–0.40)
Total Protein: 7.3 g/dL (ref 6.0–8.5)

## 2021-04-17 LAB — LIPID PANEL
Chol/HDL Ratio: 2.8 ratio (ref 0.0–4.4)
Cholesterol, Total: 183 mg/dL (ref 100–199)
HDL: 65 mg/dL (ref >39)
LDL Chol Calc (NIH): 107 mg/dL — ABNORMAL HIGH (ref 0–99)
Triglycerides: 57 mg/dL (ref 0–149)
VLDL Cholesterol Cal: 11 mg/dL (ref 5–40)

## 2021-04-22 ENCOUNTER — Other Ambulatory Visit: Payer: Self-pay

## 2021-04-22 DIAGNOSIS — E785 Hyperlipidemia, unspecified: Secondary | ICD-10-CM

## 2021-04-22 DIAGNOSIS — Z79899 Other long term (current) drug therapy: Secondary | ICD-10-CM

## 2021-04-22 MED ORDER — ROSUVASTATIN CALCIUM 40 MG PO TABS: 40.0000 mg | ORAL_TABLET | Freq: Every day | ORAL | 6 refills | Status: DC

## 2021-04-27 ENCOUNTER — Encounter (HOSPITAL_BASED_OUTPATIENT_CLINIC_OR_DEPARTMENT_OTHER): Payer: Self-pay | Admitting: Cardiovascular Disease

## 2021-04-29 ENCOUNTER — Encounter (HOSPITAL_BASED_OUTPATIENT_CLINIC_OR_DEPARTMENT_OTHER): Payer: Self-pay | Admitting: Cardiovascular Disease

## 2021-05-26 ENCOUNTER — Other Ambulatory Visit: Payer: Self-pay | Admitting: *Deleted

## 2021-05-26 DIAGNOSIS — I7101 Dissection of ascending aorta: Secondary | ICD-10-CM

## 2021-06-29 ENCOUNTER — Ambulatory Visit (HOSPITAL_BASED_OUTPATIENT_CLINIC_OR_DEPARTMENT_OTHER): Payer: BC Managed Care – PPO | Admitting: Cardiovascular Disease

## 2021-06-29 NOTE — Progress Notes (Incomplete)
?Cardiology Office Note:   ? ?Date:  06/29/2021  ? ?ID:  Catherine Mcdonald, DOB 23-Nov-1970, MRN [id number] ? ?PCP:  Robyne Peers, MD ?  ?[facility] Providers ?Cardiologist:  Skeet Latch, MD    ? ?Referring MD: Robyne Peers, MD  ? ?No chief complaint on file. ? ? ?History of Present Illness:   ? ?Catherine Mcdonald is a 51 y.o. 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 She was admitted 12/2020 with acute type A dissection. She initially presented as an NSTEMI however she was taken to the CT scanner and found to actually have a Type A dissection.  She underwent emergent surgery with Dr. Kipp Brood, where she had an 8 mm graft placed and an aortic valve re-suspension replacement of the ascending aorta and arch. Post-operatively she has continued to have poorly controlled blood pressures and started to take lisinopril She followed up with Sande Rives, PA-C and reported edema and cough. Her blood pressures remained uncontrolled, so metoprolol was switched to carvedilol. She was both on lisinopril and losartan, so lisinopril was discontinued. Amlodipine was started. She reported palpitations and was given a 7 day Zio monitor. ? ?Today, she is accompanied by her husband. Lately she has been feeling better. They report her main issue at this time is severe dyspnea. She becomes short winded with minimal exertion such as walking to the bathroom. For exercise, she will go walking around the neighborhood, but still becomes short of breath easily. With the cold weather she has been trying to go on longer walks. If she is sitting upright she generally feels okay. If she stands for a prolonged period, her voice starts getting raspy and it is more difficult to breathe. While sleeping at night she uses two pillows because she feels too much pressure if she lies flat. She also struggles with LE  edema, and will elevate her legs as needed. ?With coughing she will feel tenderness in her chest. Certain positions induce a coughing episode, with phlegm production. If she coughs too severely she will become nauseous as well with dry heaves. This has been occurring at least once a day. She also continues to feel flutters at times. Additionally, she has had constant tingling in her feet and toes daily since being in the hospital. She presents a BP log, which shows higher readings in the mornings. After taking her medication, her BP may decrease from 332 to 951 systolic. She usually takes her doses of carvedilol between 9 and 10 AM and between 5 and 6 PM. Prior to her hospitalization her blood pressure had been well controlled on only 5 mg amlodipine. Currently her regimen includes carvedilol, amlodipine, and losartan. Previously she drank coffee regularly, but now she avoids coffee completely. She is not a smoker. She denies any lightheadedness, headaches, syncope, or PND. ? ?Past Medical History:  ?Diagnosis Date  ? Allergy   ? Anemia   ? had ekg/stress test/echo done in March at [facility] d/t symptoms of anemia-cardiac r/o per pt-records requested  ? Arthritis   ? Essential hypertension 02/11/2021  ? Headache(784.0)   ? History of blood transfusion 05/2012  ? [facility]  ? Hyperlipidemia   ? Hypertension   ? meds d/c about 1 year ago after starting exercise program with b/p controlled-pcp aware  ? Menorrhagia 03/01/2012  ? Pure hypercholesterolemia 02/11/2021  ? Seizures ([place])   ? as an  infant with high fevers  ? Sickle cell trait ([place])   ? ? ?Past Surgical History:  ?Procedure Laterality Date  ? CESAREAN SECTION    ? TEE WITHOUT CARDIOVERSION N/A 01/08/2021  ? Procedure: TRANSESOPHAGEAL ECHOCARDIOGRAM (TEE);  Surgeon: Lajuana Matte, MD;  Location: [place];  Service: Open Heart Surgery;  Laterality: N/A;  ? THORACIC AORTIC ANEURYSM REPAIR N/A 01/08/2021  ? Procedure: Repair of Acute  Ascending Thoracic Aortic Dissection Using Hemashield Platinum Graft Size 28MM;  Surgeon: Lajuana Matte, MD;  Location: [place];  Service: Open Heart Surgery;  Laterality: N/A;  ? VAGINAL HYSTERECTOMY N/A 07/12/2012  ? Procedure: HYSTERECTOMY VAGINAL;  Surgeon: Ena Dawley, MD;  Location: [facility];  Service: Gynecology;  Laterality: N/A;  ? WISDOM TOOTH EXTRACTION    ? ? ?Current Medications: ?No outpatient medications have been marked as taking for the 06/29/21 encounter (Appointment) with Skeet Latch, MD.  ?  ? ?Allergies:   Patient has no known allergies.  ? ?Social History  ? ?Socioeconomic History  ? Marital status: Married  ?  Spouse name: Not on file  ? Number of children: Not on file  ? Years of education: Not on file  ? Highest education level: Not on file  ?Occupational History  ? Not on file  ?Tobacco Use  ? Smoking status: Never  ? Smokeless tobacco: Never  ?Substance and Sexual Activity  ? Alcohol use: Yes  ?  Comment: RARE  ? Drug use: No  ? Sexual activity: Yes  ?  Birth control/protection: Condom  ?Other Topics Concern  ? Not on file  ?Social History Narrative  ? Not on file  ? ?Social Determinants of Health  ? ?Financial Resource Strain: Not on file  ?Food Insecurity: Not on file  ?Transportation Needs: Not on file  ?Physical Activity: Not on file  ?Stress: Not on file  ?Social Connections: Not on file  ?  ? ?Family History: ?The patient's family history includes Diabetes in her maternal grandmother; Hyperlipidemia in her mother; Hypertension in her maternal grandmother and mother. There is no history of Colon cancer, Esophageal cancer, Rectal cancer, or Stomach cancer. ? ?ROS:   ?Please see the history of present illness.    ?(+) Shortness of breath ?(+) Orthopnea ?(+) Cough, sometimes with phlegm production, nausea, and dry heaves ?(+) Chest tenderness ?(+) Tingling/Numbness in bilateral feet and toes ?(+) LE edema ?(+) Palpitations ?All other systems reviewed and are  negative. ? ?EKGs/Labs/Other Studies Reviewed:   ? ?The following studies were reviewed today: ? ?UE Venous Duplex 01/14/2021: ?Summary:  ?   ?Left:  ?No evidence of deep vein thrombosis in the upper extremity. Findings  ?consistent with age indeterminate superficial vein thrombosis involving the left Cephalic vein.  ? ?CT-A Chest 01/08/2021: ?COMPARISON:  01/08/2021 ?  ?FINDINGS: ?Cardiovascular: This is a technically adequate evaluation of the ?pulmonary vasculature. No filling defects or pulmonary emboli. ?  ?There is a type A thoracic aortic dissection, extending from the ?aortic root through the diaphragmatic hiatus. The distal margin of ?the aortic dissection is not visualized due to slice selection. The ?true lumen gives rise to the great vessels. I do not see any ?evidence of aortic rupture. Maximal diameter of the ascending ?thoracic aorta measures 5.1 cm. ?  ?The heart is unremarkable without pericardial effusion. ?  ?Mediastinum/Nodes: No enlarged mediastinal, hilar, or axillary lymph ?nodes. Thyroid gland, trachea, and esophagus demonstrate no ?significant findings. ?  ?Lungs/Pleura: No acute airspace disease, effusion, or pneumothorax. ?Central airways are  patent. Minimal hypoventilatory changes at the ?lung bases. ?  ?Upper Abdomen: Limited imaging through the upper abdomen ?demonstrates no additional findings. ?  ?Musculoskeletal: There are no acute or destructive bony lesions. ?Reconstructed images demonstrate no additional findings. ?  ?Review of the MIP images confirms the above findings. ?  ?IMPRESSION: ?1. 5.1 cm ascending thoracic aortic aneurysm, with superimposed type ?A thoracic aortic dissection. Dissection extends from the aortic ?root through the diaphragmatic hiatus. The inferior margin of the ?dissection is not visualized due to slice selection. ?2. No evidence of pulmonary embolus. ?  ?Critical Value/emergent results were called by telephone at the time ?of interpretation on 01/08/2021  at 5:09 pm to provider Regan Lemming ?, who verbally acknowledged these results.  ? ?Echo Intraoperative TEE 24/19/9144: ?Complications: No known complications during this procedure.  ?POST-OP IMPRESSIONS  ?_ Left Ventricle: Terie Purser

## 2021-07-02 ENCOUNTER — Ambulatory Visit: Payer: BC Managed Care – PPO | Admitting: Thoracic Surgery (Cardiothoracic Vascular Surgery)

## 2021-07-02 ENCOUNTER — Ambulatory Visit
Admission: RE | Admit: 2021-07-02 | Discharge: 2021-07-02 | Disposition: A | Discharge status: Home / Self Care | Payer: BC Managed Care – PPO | Source: Ambulatory Visit | Attending: Thoracic Surgery (Cardiothoracic Vascular Surgery) | Admitting: Thoracic Surgery (Cardiothoracic Vascular Surgery)

## 2021-07-02 VITALS — BP 150/93 | HR 71 | Resp 20 | Ht 70.0 in | Wt 183.0 lb

## 2021-07-02 DIAGNOSIS — I7101 Dissection of ascending aorta: Secondary | ICD-10-CM

## 2021-07-02 DIAGNOSIS — Z09 Encounter for follow-up examination after completed treatment for conditions other than malignant neoplasm: Secondary | ICD-10-CM | POA: Diagnosis not present

## 2021-07-02 MED ORDER — IOPAMIDOL (ISOVUE-370) INJECTION 76%
75.0000 mL | Freq: Once | INTRAVENOUS | Status: AC | PRN
  Administered 2021-07-02: 75 mL via INTRAVENOUS

## 2021-07-02 NOTE — Progress Notes (Signed)
? ?   ?  [address] ?      [place] [address] ?            [phone]       ? ?Celine Ahr ?[place] Record #[id number] ?Date of Birth: [birth year] ? ?Referring: Pixie Casino, MD ?Primary Care: Robyne Peers, MD ?Primary Cardiologist:Tiffany Oval Linsey, MD ? ?Reason for visit:   follow-up ? ?History of Present Illness:     ?Ms. Pixley comes in for 1-monthfollow-up appointment.  She is status post type a dissection repair in October 2022.  She mostly complains of musculoskeletal complaints.  She denies any shortness of breath. ? ?Physical Exam: ?BP (!) 150/93   Pulse 71   Resp 20   Ht '5\' 10"'$  (1.778 m)   Wt 183 lb (83 kg)   LMP 05/31/2012   SpO2 97% Comment: RA  BMI 26.26 kg/m?  ? ?Alert NAD ?Incision clean.  Sternum stable ?Abdomen, ND ?No peripheral edema ? ? ?Diagnostic Studies & Laboratory data: ?CT chest: ? IMPRESSION: ?Vascular: ?  ?1. Postsurgical changes after ascending aortic tube graft repair ?without complicating features. ?2. 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 Persistent opacification of ?the false lumen. ?3. Dissection flap extension into the left renal artery with similar ?appearing decreased perfusion compared to the right ? ?Assessment / Plan:   ?52year old female status post type a dissection repair.  Cross-sectional imaging is essentially stable.  Overall she is doing well.  She will follow-up in a year with another CT chest. ? ? ?HLucile CraterLightfoot ?07/02/2021 3:49 PM ? ? ? ? ? ? ?

## 2021-07-06 ENCOUNTER — Encounter (HOSPITAL_BASED_OUTPATIENT_CLINIC_OR_DEPARTMENT_OTHER): Payer: Self-pay | Admitting: Family

## 2021-07-06 ENCOUNTER — Ambulatory Visit (HOSPITAL_BASED_OUTPATIENT_CLINIC_OR_DEPARTMENT_OTHER): Payer: BC Managed Care – PPO | Admitting: Family

## 2021-07-06 VITALS — BP 122/80 | HR 72 | Ht 70.0 in | Wt 186.9 lb

## 2021-07-06 DIAGNOSIS — E782 Mixed hyperlipidemia: Secondary | ICD-10-CM | POA: Diagnosis not present

## 2021-07-06 DIAGNOSIS — I7101 Dissection of ascending aorta: Secondary | ICD-10-CM | POA: Diagnosis not present

## 2021-07-06 DIAGNOSIS — Z8679 Personal history of other diseases of the circulatory system: Secondary | ICD-10-CM

## 2021-07-06 DIAGNOSIS — Z9889 Other specified postprocedural states: Secondary | ICD-10-CM | POA: Diagnosis not present

## 2021-07-06 DIAGNOSIS — I471 Supraventricular tachycardia: Secondary | ICD-10-CM

## 2021-07-06 DIAGNOSIS — I10 Essential (primary) hypertension: Secondary | ICD-10-CM

## 2021-07-06 MED ORDER — AMLODIPINE BESYLATE 5 MG PO TABS: 10.0000 mg | ORAL_TABLET | Freq: Every day | ORAL | 3 refills | Status: DC

## 2021-07-06 NOTE — Telephone Encounter (Signed)
Patient seen by Caitlin W NP today  

## 2021-07-06 NOTE — Progress Notes (Signed)
? ?Office Visit  ?  ?Patient Name: Catherine Mcdonald ?Date of Encounter: 07/06/2021 ? ?PCP:  Robyne Peers, MD ?  ?[place]  ?Cardiologist:  Skeet Latch, MD  ?Advanced Practice Provider:  No care team member to display ?Electrophysiologist:  None  ?  ? ?Chief Complaint  ?  ?Catherine Mcdonald is a 51 y.o. female with a hx of thoracic aortic aneurysm with type I dissection 12/31/2020 s/p repair with graft as well as aortic valve resuspension and replacement of ascending aorta/arch, right pleural effusion s/p thoracentesis 03/03/2021, hypertension, hyperlipidemia, febrile seizures as an infant presents today for hypertension follow-up ? ?Past Medical History  ?  ?Past Medical History:  ?Diagnosis Date  ? Allergy   ? Anemia   ? had ekg/stress test/echo done in March at [facility] d/t symptoms of anemia-cardiac r/o per pt-records requested  ? Arthritis   ? Essential hypertension 02/11/2021  ? Headache(784.0)   ? History of blood transfusion 05/2012  ? [facility]  ? Hyperlipidemia   ? Hypertension   ? meds d/c about 1 year ago after starting exercise program with b/p controlled-pcp aware  ? Menorrhagia 03/01/2012  ? Pure hypercholesterolemia 02/11/2021  ? Seizures ([place])   ? as an infant with high fevers  ? Sickle cell trait ([place])   ? ?Past Surgical History:  ?Procedure Laterality Date  ? CESAREAN SECTION    ? TEE WITHOUT CARDIOVERSION N/A 01/08/2021  ? Procedure: TRANSESOPHAGEAL ECHOCARDIOGRAM (TEE);  Surgeon: Lajuana Matte, MD;  Location: [place];  Service: Open Heart Surgery;  Laterality: N/A;  ? THORACIC AORTIC ANEURYSM REPAIR N/A 01/08/2021  ? Procedure: Repair of Acute Ascending Thoracic Aortic Dissection Using Hemashield Platinum Graft Size 28MM;  Surgeon: Lajuana Matte, MD;  Location: [place];  Service: Open Heart Surgery;  Laterality: N/A;  ? VAGINAL HYSTERECTOMY N/A 07/12/2012  ? Procedure: HYSTERECTOMY VAGINAL;  Surgeon: Ena Dawley, MD;  Location:  [facility];  Service: Gynecology;  Laterality: N/A;  ? WISDOM TOOTH EXTRACTION    ? ? ?Allergies ? ?No Known Allergies ? ?History of Present Illness  ?  ?Catherine Mcdonald is a 51 y.o. female with a hx of thoracic aortic aneurysm with type I dissection 12/31/2020 s/p repair with graft as well as aortic valve resuspension and replacement of ascending aorta/arch, right pleural effusion s/p thoracentesis 03/03/2021, hypertension, hyperlipidemia, febrile seizures as an infant last seen 03/16/21. ? ?Admitted 10/28 - 01/16/2021 with acute thoracic aortic dissection after presenting with acute onset left neck and chest pain.  Initially ruled in for NSTEMI with ST depressions in inferior leads.  However CT scan showed type a dissection stenting from aortic root to the diaphragmatic hiatus.  Surgery consulted and underwent emergent surgery with Dr. Kipp Brood with aortic dissection repair with 8 mm graft and aortic valve resuspension and replacement of ascending aorta and arch. ? ?Seen 01/29/2021 noting slow recovery and shortness of breath on exertion as well as orthopnea, lower extremity edema.  BP elevated.  BNP mildly elevated.  Chest x-ray with bilateral pleural effusion right greater than left.  Treated with Lasix for 1 week.  Medication changes made to optimize BP.  Echo with LVEF 65 to 75%, moderate LVH, small pericardial effusion but no significant valvular disease.  Also showed dissection flap in the abdominal aorta with flow into false lumen.  CTA chest/abdomen/pelvis ordered for further evaluation revealing residual dissection flap extends from just beyond surgical repair to the left iliac artery, small  focus of contrast lateral to the noncoronary cusp of the aortic valve, proximal to the anastomosis at the sinotubular junction of uncertain significance and linear contrast along caudal aortic arch which abuts the distal anastomosis proximally and continues with false channel distally.  ZIO with underlying sinus rhythm  with 25 episodes for SVT which was likely etiology of palpitations. ? ?Last seen in clinic 03/15/2021 noting improvement in her breathing.  BP in clinic 130/80 in the office and well controlled at home.  No changes were made at that time.  She recently saw Dr. Kipp Brood 07/02/2021 with CT showing stable cross-sectional imaging recommended for follow-up in 1 year with repeat CT. ? ?Presents today for follow-up.  She is enjoying participating in cardiac rehab.  Her last session is May 4 and after that plans to use the exercise facilities at her workplace.  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  She notes no chest pain, pressure, tightness.  Denies dyspnea on exertion, shortness of breath.  Notes her blood pressure at home is often been in the 287O to 676H systolic with heart rate in the 70s. ? ? ?EKGs/Labs/Other Studies Reviewed:  ? ?The following studies were reviewed today: ? ?Echocardiogram 02/17/2021: ?Impressions: ?1. The patient has a known history of type A aortic dissection s/p repair  ?with 24m Hemashield Platinum graft and aortic valve resuspension.  ? 2. There is a dissection flap well visualized in the abdominal aorta with  ?flow into the false lumen detected with color doppler. Will likely need  ?CTA imaging (prior CT did not include abdominal imaging) to evaluate  ?further.  ? 3. Left ventricular ejection fraction, by estimation, is 65 to 70%. The  ?left ventricle has normal function. The left ventricle has no regional  ?wall motion abnormalities. There is moderate concentric left ventricular  ?hypertrophy. Left ventricular  ?diastolic parameters were normal.  ? 4. Right ventricular systolic function is normal. The right ventricular  ?size is normal.  ? 5. A small pericardial effusion is present. The pericardial effusion is  ?mostlly localized around the RA and RV. There is no evidence of cardiac  ?tamponade.  ? 6. The mitral valve is normal in structure. Trivial  mitral valve  ?regurgitation.  ? 7. The aortic valve is tricuspid. Aortic valve regurgitation is trivial.  ?No aortic stenosis is present.  ? 8. The inferior vena cava is normal in size with greater than 50%  ?respiratory variability, suggesting right atrial pressure of 3 mmHg.  ?_______________ ?  ?Zio Monitor 02/02/2021 to 02/09/2021: ?Monitor shows sinus rhythm - there were 25 episodes of SVT, sometimes with rates into the 150-180 bpm range, which likely explain recurrent palpitations. ?  ? ?EKG:  No EKG today. ? ?Recent Labs: ?01/10/2021: Magnesium 2.9 ?01/29/2021: BNP 183.9 ?03/09/2021: BUN 10; Creatinine, Ser 0.91; Hemoglobin 12.1; Platelets 467; Potassium 4.1; Sodium 142 ?04/16/2021: ALT 12  ?Recent Lipid Panel ?   ?Component Value Date/Time  ? CHOL 183 04/16/2021 1230  ? TRIG 57 04/16/2021 1230  ? HDL 65 04/16/2021 1230  ? CHOLHDL 2.8 04/16/2021 1230  ? CHOLHDL 3.3 01/08/2021 1435  ? VLDL 10 01/08/2021 1435  ? LDLCALC 107 (H) 04/16/2021 1230  ? ?Home Medications  ? ?Current Meds  ?Medication Sig  ? amLODipine (NORVASC) 5 MG tablet Take 1 tablet (5 mg total) by mouth daily.  ? aspirin 81 MG EC tablet Take 1 tablet (81 mg total) by mouth daily.  ? carvedilol (COREG) 6.25 MG tablet Take  1 tablet (6.25 mg total) by mouth 2 (two) times daily.  ? losartan (COZAAR) 25 MG tablet Take 1 tablet (25 mg total) by mouth daily.  ? psyllium (METAMUCIL) 58.6 % powder Take 1 packet by mouth daily.  ? rosuvastatin (CRESTOR) 40 MG tablet Take 1 tablet (40 mg total) by mouth daily.  ?  ? ?Review of Systems  ?  ?All other systems reviewed and are otherwise negative except as noted above. ? ?Physical Exam  ?  ?VS:  BP 122/80   Pulse 72   Ht '5\' 10"'$  (1.778 m)   Wt 186 lb 14.4 oz (84.8 kg)   LMP 05/31/2012   SpO2 97%   BMI 26.82 kg/m?  , BMI Body mass index is 26.82 kg/m?. ? ?Wt Readings from Last 3 Encounters:  ?07/06/21 186 lb 14.4 oz (84.8 kg)  ?07/02/21 183 lb (83 kg)  ?03/16/21 189 lb (85.7 kg)  ?  ?GEN: Well nourished,  well developed, in no acute distress. ?HEENT: normal. ?Neck: Supple, no JVD, carotid bruits, or masses. ?Cardiac: RRR, no murmurs, rubs, or gallops. No clubbing, cyanosis, edema.  Radials/PT 2+ and equal bilaterall

## 2021-07-06 NOTE — Patient Instructions (Signed)
Medication Instructions:  ?Your physician has recommended you make the following change in your medication:  ? ?Change: Amlodipine '10mg'$  daily. Please take two '5mg'$  tablets until you run out and then let us know and we can send in a refill for you.  ? ? ?*If you need a refill on your cardiac medications before your next appointment, please call your pharmacy* ? ? ?Lab Work: ?None ordered today  ? ?Testing/Procedures: ?None ordered today  ? ? ?Follow-Up: ?At [facility], you and your health needs are our priority.  As part of our continuing mission to provide you with exceptional heart care, we have created designated Provider Care Teams.  These Care Teams include your primary Cardiologist (physician) and Advanced Practice Providers (APPs -  Physician Assistants and Nurse Practitioners) who all work together to provide you with the care you need, when you need it. ? ?We recommend signing up for the patient portal called "MyChart".  Sign up information is provided on this After Visit Summary.  MyChart is used to connect with patients for Virtual Visits (Telemedicine).  Patients are able to view lab/test results, encounter notes, upcoming appointments, etc.  Non-urgent messages can be sent to your provider as well.   ?To learn more about what you can do with MyChart, go to [email or web address].   ? ?Your next appointment:   ?Follow up as scheduled with Dr. Oval Linsey  ? ?Other Instructions ?Heart Healthy Diet Recommendations: ?A low-salt diet is recommended. Meats should be grilled, baked, or boiled. Avoid fried foods. Focus on lean protein sources like fish or chicken with vegetables and fruits. The American Heart Association is a Microbiologist!  American Heart Association Diet and Lifeystyle Recommendations  ? ?Exercise recommendations: ?The American Heart Association recommends 150 minutes of moderate intensity exercise weekly. ?Try 30 minutes of moderate intensity exercise 4-5 times per week. ?This could include  walking, jogging, or swimming. ? ? ?Important Information About Sugar ? ? ? ? ? ? ?

## 2021-07-15 ENCOUNTER — Encounter (HOSPITAL_BASED_OUTPATIENT_CLINIC_OR_DEPARTMENT_OTHER): Payer: Self-pay

## 2021-07-29 NOTE — Telephone Encounter (Signed)
Left message for patient to call back        "Bee,   I am reaching out to check on your blood pressure after our most recent office visit. When you get the chance if you will please send me a few recent blood pressure readings.   Best, Loel Dubonnet, NP"

## 2021-07-29 NOTE — Telephone Encounter (Signed)
Covering Caitlin's inbox. Received unread message notification, please reach out to patient to relay Caitlin's inquiry. Thanks.

## 2021-07-30 ENCOUNTER — Encounter (HOSPITAL_BASED_OUTPATIENT_CLINIC_OR_DEPARTMENT_OTHER): Payer: Self-pay

## 2021-07-30 NOTE — Telephone Encounter (Signed)
2nd call attempt, no answer, LM

## 2021-07-30 NOTE — Telephone Encounter (Signed)
3rd call attempt, no answer, lm and question mailed to patient

## 2021-08-16 ENCOUNTER — Other Ambulatory Visit: Payer: Self-pay | Admitting: *Deleted

## 2021-08-16 DIAGNOSIS — I7101 Dissection of ascending aorta: Secondary | ICD-10-CM

## 2021-08-19 ENCOUNTER — Encounter (HOSPITAL_BASED_OUTPATIENT_CLINIC_OR_DEPARTMENT_OTHER): Payer: Self-pay

## 2021-09-30 NOTE — Progress Notes (Signed)
'@Patient'  ID: Catherine Mcdonald, female    DOB: 13-Apr-1970, 51 y.o.   MRN: [id number]  Chief Complaint  Patient presents with   Follow-up    PCP advised her to follow up on her Emphysema. Pt states she has never been told she had Emphysema.    Referring provider: Robyne Peers, MD  HPI: 51 year old female, never smoked.  Past medical history significant for hypertension, thoracic aortic aneurysm, hyperlipidemia, pleural effusion.  Patient of Dr. Valeta Harms, last seen in office on 03/03/2021 for post-op right pleural effusion.  During that visit patient underwent thoracentesis.  Post procedure chest x-ray showed resolution of right pleural effusion, no normal pneumothorax.  10/01/2021 Patient presents today for overdue follow-up.  PCP advised patient follow-up with our office due to emphysema.  Patient had a CT chest in April 2023 that showed trace bilateral pleural effusions, right greater than left, significantly decreased in size from comparison.  Mild centrilobular emphysema. She is a never smoker, no second hand exposure. She has mild dyspnea symptoms with exercise which is improving. No cough or wheezing.   No Known Allergies  Immunization History  Administered Date(s) Administered   Influenza,inj,Quad PF,6+ Mos 03/25/2015, 01/17/2019   Influenza-Unspecified 02/19/2019   PFIZER Comirnaty(Gray Top)Covid-19 Tri-Sucrose Vaccine 06/13/2019, 07/09/2019   PFIZER(Purple Top)SARS-COV-2 Vaccination 06/13/2019, 07/09/2019   Tdap 05/21/2010    Past Medical History:  Diagnosis Date   Allergy    Anemia    had ekg/stress test/echo done in March at [facility] d/t symptoms of anemia-cardiac r/o per pt-records requested   Arthritis    Essential hypertension 02/11/2021   Headache(784.0)    History of blood transfusion 05/2012   [facility]   Hyperlipidemia    Hypertension    meds d/c about 1 year ago after starting exercise program with b/p controlled-pcp aware    Menorrhagia 03/01/2012   Pure hypercholesterolemia 02/11/2021   Seizures ([place])    as an infant with high fevers   Sickle cell trait ([place])     Tobacco History: Social History   Tobacco Use  Smoking Status Never   Passive exposure: Never  Smokeless Tobacco Never   Counseling given: Not Answered   Outpatient Medications Prior to Visit  Medication Sig Dispense Refill   amLODipine (NORVASC) 5 MG tablet Take 2 tablets (10 mg total) by mouth daily. 180 tablet 3   aspirin 81 MG EC tablet Take 1 tablet (81 mg total) by mouth daily. 30 tablet 1   carvedilol (COREG) 6.25 MG tablet Take 1 tablet (6.25 mg total) by mouth 2 (two) times daily. 180 tablet 3   losartan (COZAAR) 25 MG tablet Take 1 tablet (25 mg total) by mouth daily. 90 tablet 3   psyllium (METAMUCIL) 58.6 % powder Take 1 packet by mouth daily.     rosuvastatin (CRESTOR) 40 MG tablet Take 1 tablet (40 mg total) by mouth daily. 30 tablet 6   No facility-administered medications prior to visit.   Review of Systems  Review of Systems  Constitutional: Negative.   HENT: Negative.    Respiratory: Negative.  Negative for cough and wheezing.        DOE- improving   Cardiovascular: Negative.    Physical Exam  BP 116/78 (BP Location: Left Arm, Patient Position: Sitting, Cuff Size: Normal)   Pulse 62   Temp 97.8 F (36.6 C) (Oral)   Ht '5\' 10"'  (1.778 m)   Wt 187 lb 6.4 oz (85 kg)   LMP 05/31/2012   SpO2  99%   BMI 26.89 kg/m  Physical Exam Constitutional:      Appearance: Normal appearance.  HENT:     Head: Normocephalic and atraumatic.     Mouth/Throat:     Mouth: Mucous membranes are moist.     Pharynx: Oropharynx is clear.  Cardiovascular:     Rate and Rhythm: Normal rate and regular rhythm.  Pulmonary:     Effort: Pulmonary effort is normal.     Breath sounds: Normal breath sounds.     Comments: CTA Musculoskeletal:        General: Normal range of motion.  Skin:    General: Skin is warm and dry.   Neurological:     General: No focal deficit present.     Mental Status: She is alert and oriented to person, place, and time. Mental status is at baseline.  Psychiatric:        Mood and Affect: Mood normal.        Behavior: Behavior normal.        Thought Content: Thought content normal.        Judgment: Judgment normal.      Lab Results:  CBC    Component Value Date/Time   WBC 6.5 03/09/2021 1500   WBC 16.1 (H) 01/17/2021 0534   RBC 5.39 (H) 03/09/2021 1500   RBC 3.02 (L) 01/17/2021 0534   HGB 12.1 03/09/2021 1500   HCT 41.3 03/09/2021 1500   PLT 467 (H) 03/09/2021 1500   MCV 77 (L) 03/09/2021 1500   MCH 22.4 (L) 03/09/2021 1500   MCH 26.5 01/17/2021 0534   MCHC 29.3 (L) 03/09/2021 1500   MCHC 30.9 01/17/2021 0534   RDW 15.9 (H) 03/09/2021 1500   LYMPHSABS 1.6 01/08/2021 1427   MONOABS 0.6 01/08/2021 1427   EOSABS 0.1 01/08/2021 1427   BASOSABS 0.0 01/08/2021 1427    BMET    Component Value Date/Time   NA 142 03/09/2021 1500   K 4.1 03/09/2021 1500   CL 102 03/09/2021 1500   CO2 24 03/09/2021 1500   GLUCOSE 78 03/09/2021 1500   GLUCOSE 116 (H) 01/17/2021 0534   BUN 10 03/09/2021 1500   CREATININE 0.91 03/09/2021 1500   CALCIUM 9.0 03/09/2021 1500   GFRNONAA >60 01/17/2021 0534   GFRAA  04/21/2009 0059    >60        The eGFR has been calculated using the MDRD equation. This calculation has not been validated in all clinical situations. eGFR's persistently <60 mL/min signify possible Chronic Kidney Disease.    BNP    Component Value Date/Time   BNP 183.9 (H) 01/29/2021 1606   BNP 34.3 01/08/2021 1427    ProBNP No results found for: "PROBNP"  Imaging: No results found.   Assessment & Plan:   Other emphysema ([place]) - Never smoked. CTA imaging in April showed mild centrilobular emphysema. She is scheduled for CT chest wo contrast tomorrow. Needs Alpha-1 testing and pulmonary function testing. FU in 4 weeks.      Martyn Ehrich,  NP 10/01/2021

## 2021-10-01 ENCOUNTER — Ambulatory Visit: Payer: BC Managed Care – PPO | Admitting: Primary Care

## 2021-10-01 ENCOUNTER — Ambulatory Visit
Admission: RE | Admit: 2021-10-01 | Discharge: 2021-10-01 | Disposition: A | Discharge status: Home / Self Care | Payer: BC Managed Care – PPO | Source: Ambulatory Visit | Attending: Thoracic Surgery (Cardiothoracic Vascular Surgery) | Admitting: Thoracic Surgery (Cardiothoracic Vascular Surgery)

## 2021-10-01 ENCOUNTER — Encounter: Payer: Self-pay | Admitting: Primary Care

## 2021-10-01 ENCOUNTER — Telehealth: Payer: BC Managed Care – PPO | Admitting: Thoracic Surgery (Cardiothoracic Vascular Surgery)

## 2021-10-01 VITALS — BP 116/78 | HR 62 | Temp 97.8°F | Ht 70.0 in | Wt 187.4 lb

## 2021-10-01 DIAGNOSIS — R0609 Other forms of dyspnea: Secondary | ICD-10-CM

## 2021-10-01 DIAGNOSIS — J438 Other emphysema: Secondary | ICD-10-CM | POA: Insufficient documentation

## 2021-10-01 DIAGNOSIS — I7101 Dissection of ascending aorta: Secondary | ICD-10-CM

## 2021-10-01 NOTE — Patient Instructions (Signed)
CT chest today should be fine to re-assess emphysema  Orders: Labs today (alpha-1 phenotype) Pulmonary function testing  Follow-up: 4 weeks with 1 hour PFTs prior

## 2021-10-01 NOTE — Assessment & Plan Note (Signed)
-   Never smoked. CTA imaging in April showed mild centrilobular emphysema. She is scheduled for CT chest wo contrast tomorrow. Needs Alpha-1 testing and pulmonary function testing. FU in 4 weeks.

## 2021-10-01 NOTE — Addendum Note (Signed)
Addended by: Martyn Ehrich on: 10/01/2021 11:04 AM   Modules accepted: Orders

## 2021-10-08 ENCOUNTER — Ambulatory Visit: Payer: BC Managed Care – PPO | Admitting: Thoracic Surgery (Cardiothoracic Vascular Surgery)

## 2021-10-11 ENCOUNTER — Other Ambulatory Visit: Payer: Self-pay | Admitting: Student

## 2021-10-11 NOTE — Telephone Encounter (Signed)
This is Dr. Whipholt's pt.  °

## 2021-10-12 LAB — ALPHA-1 ANTITRYPSIN PHENOTYPE: A-1 Antitrypsin, Ser: 166 mg/dL (ref 83–199)

## 2021-10-22 ENCOUNTER — Ambulatory Visit (INDEPENDENT_AMBULATORY_CARE_PROVIDER_SITE_OTHER): Payer: BC Managed Care – PPO | Admitting: Thoracic Surgery (Cardiothoracic Vascular Surgery)

## 2021-10-22 DIAGNOSIS — I7101 Dissection of ascending aorta: Secondary | ICD-10-CM | POA: Diagnosis not present

## 2021-10-22 NOTE — Progress Notes (Signed)
     [address]       [name] [address]             [phone]       Patient: Home Provider: Office Consent for Telemedicine visit obtained.  Today's visit was completed via a real-time telehealth (see specific modality noted below). The patient/authorized person provided oral consent at the time of the visit to engage in a telemedicine encounter with the present provider at [facility]. The patient/authorized person was informed of the potential benefits, limitations, and risks of telemedicine. The patient/authorized person expressed understanding that the laws that protect confidentiality also apply to telemedicine. The patient/authorized person acknowledged understanding that telemedicine does not provide emergency services and that he or she would need to call 911 or proceed to the nearest hospital for help if such a need arose.   Total time spent in the clinical discussion 10 minutes.  Telehealth Modality: Phone visit (audio only)    I had a telephone visit with Catherine Mcdonald.  She was not scheduled for another appointment until March 2024.  We read discussed the images from her CT scan and April 2023.  She is aware of all of her weight restrictions and blood pressure control goals.  I will see her back in April of next year.    Steve Youngberg Bary Leriche

## 2021-10-26 ENCOUNTER — Encounter (HOSPITAL_BASED_OUTPATIENT_CLINIC_OR_DEPARTMENT_OTHER): Payer: Self-pay | Admitting: Cardiovascular Disease

## 2021-10-26 ENCOUNTER — Ambulatory Visit (INDEPENDENT_AMBULATORY_CARE_PROVIDER_SITE_OTHER): Payer: BC Managed Care – PPO | Admitting: Cardiovascular Disease

## 2021-10-26 DIAGNOSIS — I10 Essential (primary) hypertension: Secondary | ICD-10-CM

## 2021-10-26 DIAGNOSIS — I7101 Dissection of ascending aorta: Secondary | ICD-10-CM

## 2021-10-26 DIAGNOSIS — I471 Supraventricular tachycardia, unspecified: Secondary | ICD-10-CM

## 2021-10-26 DIAGNOSIS — E7800 Pure hypercholesterolemia, unspecified: Secondary | ICD-10-CM

## 2021-10-26 HISTORY — DX: Supraventricular tachycardia, unspecified: I47.10

## 2021-10-26 NOTE — Assessment & Plan Note (Signed)
Continue rosuvastatin.  

## 2021-10-26 NOTE — Patient Instructions (Signed)
Medication Instructions:  ?Continue current medications ? ?*If you need a refill on your cardiac medications before your next appointment, please call your pharmacy* ? ? ?Lab Work: ?None Ordered ? ? ?Testing/Procedures: ?None Ordered ? ? ?Follow-Up: ?At [facility], you and your health needs are our priority.  As part of our continuing mission to provide you with exceptional heart care, we have created designated Provider Care Teams.  These Care Teams include your primary Cardiologist (physician) and Advanced Practice Providers (APPs -  Physician Assistants and Nurse Practitioners) who all work together to provide you with the care you need, when you need it. ? ?We recommend signing up for the patient portal called "MyChart".  Sign up information is provided on this After Visit Summary.  MyChart is used to connect with patients for Virtual Visits (Telemedicine).  Patients are able to view lab/test results, encounter notes, upcoming appointments, etc.  Non-urgent messages can be sent to your provider as well.   ?To learn more about what you can do with MyChart, go to [email or web address].   ? ?Your next appointment:   ?6 month(s) ? ?The format for your next appointment:   ?In Person ? ?Provider:   ?Tiffany Hildreth, MD  ? ? ?Other Instructions ? ? ?Important Information About Sugar ? ? ? ? ? ? ?

## 2021-10-26 NOTE — Assessment & Plan Note (Addendum)
Blood pressure has been well-controlled. Continue carvedilol, amlodipine, and losartan.  She usually takes her medications at 8:00 but they are at her office and she has not yet taken them.  Therefore we will not add any changes to her regimen at this time.

## 2021-10-26 NOTE — Assessment & Plan Note (Signed)
She continues to have some palpitations at times.  No change since increasing carvedilol.  She notes it when she is relaxing.  Episodes last a couple seconds.  Continue carvedilol.

## 2021-10-26 NOTE — Progress Notes (Signed)
Cardiology Office Note:    Date:  10/26/2021   ID:  Catherine Mcdonald, DOB 1971/03/14, MRN [id number]  PCP:  Robyne Peers, MD   [facility] Providers Cardiologist:  Skeet Latch, MD     Referring MD: Robyne Peers, MD   No chief complaint on file.   History of Present Illness:    Catherine Mcdonald is a 51 y.o. 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 She was admitted 12/2020 with acute type A dissection. She initially presented as an NSTEMI however she was taken to the CT scanner and found to actually have a Type A dissection.  She underwent emergent surgery with Dr. Kipp Brood, where she had an 8 mm graft placed and an aortic valve re-suspension replacement of the ascending aorta and arch. Post-operatively she has continued to have poorly controlled blood pressures and started to take lisinopril She followed up with Sande Rives, PA-C and reported edema and cough. Her blood pressures remained uncontrolled, so metoprolol was switched to carvedilol. She was both on lisinopril and losartan, so lisinopril was discontinued. Amlodipine was started.   She reported palpitations and wore a 7 day Zio monitor that showed 25 episodes of SVT up to 10 seconds. Carvedilol was increased to 12.5 mg. At the last visit she reported some shortness of breath and was recovering from her surgery. She saw Sande Rives, PA-C 03/2021 and reported atypical chest pain that was thought to be musculoskeletal. She saw Laurann Montana, NP 06/2021 and amlodipine was increased due to poorly controlled blood pressure.  She had a chest CT 06/2021 and the proximal descending aorta had increased to 4.6 cm from 4.1 cm previously.  Today, she states she has been feeling good with no new complaints. Occasionally she still feels a "flutter" of palpitations lasting for seconds. This may occur a  couple times a week, mostly noticeable when she is relaxed. She denies associated lightheadedness or dizziness. At home her blood pressure is averaging 125-130/70-74. In clinic today her BP is 132/76, which she attributes to not yet taking her antihypertensives this morning. She usually takes her medication around 8 AM while at work. She has also been wondering about what kind of exercises she should complete to not exacerbate her aneurysm, such as focusing on low weight, high rep exercises. For exercise she usually walks in the morning up to 30 minutes. She has not been walking as much in the past 2 weeks as she adjusts to her work schedule which prevents her morning walks. In her regimen, we confirmed that she should be taking 12.5 mg of carvedilol. She denies any chest pain, shortness of breath, or peripheral edema. No headaches, syncope, orthopnea, or PND.   Past Medical History:  Diagnosis Date   Allergy    Anemia    had ekg/stress test/echo done in March at [facility] d/t symptoms of anemia-cardiac r/o per pt-records requested   Arthritis    Essential hypertension 02/11/2021   Headache(784.0)    History of blood transfusion 05/2012   [facility]   Hyperlipidemia    Hypertension    meds d/c about 1 year ago after starting exercise program with b/p controlled-pcp aware   Menorrhagia 03/01/2012   Pure hypercholesterolemia 02/11/2021   Seizures ([place])    as an infant with high fevers   Sickle cell trait (HCC)    SVT (supraventricular tachycardia) ([place]) 10/26/2021  Past Surgical History:  Procedure Laterality Date   CESAREAN SECTION     TEE WITHOUT CARDIOVERSION N/A 01/08/2021   Procedure: TRANSESOPHAGEAL ECHOCARDIOGRAM (TEE);  Surgeon: Lajuana Matte, MD;  Location: [facility];  Service: Open Heart Surgery;  Laterality: N/A;   THORACIC AORTIC ANEURYSM REPAIR N/A 01/08/2021   Procedure: Repair of Acute Ascending Thoracic Aortic Dissection Using Hemashield Platinum  Graft Size 28MM;  Surgeon: Lajuana Matte, MD;  Location: [facility];  Service: Open Heart Surgery;  Laterality: N/A;   VAGINAL HYSTERECTOMY N/A 07/12/2012   Procedure: HYSTERECTOMY VAGINAL;  Surgeon: Ena Dawley, MD;  Location: [facility];  Service: Gynecology;  Laterality: N/A;   WISDOM TOOTH EXTRACTION      Current Medications: Current Meds  Medication Sig   amLODipine (NORVASC) 5 MG tablet Take 2 tablets (10 mg total) by mouth daily.   aspirin 81 MG EC tablet Take 1 tablet (81 mg total) by mouth daily.   carvedilol (COREG) 6.25 MG tablet Take 1 tablet (6.25 mg total) by mouth 2 (two) times daily.   losartan (COZAAR) 25 MG tablet Take 1 tablet (25 mg total) by mouth daily.   psyllium (METAMUCIL) 58.6 % powder Take 1 packet by mouth daily.   rosuvastatin (CRESTOR) 40 MG tablet TAKE 1 TABLET(40 MG) BY MOUTH DAILY     Allergies:   Patient has no known allergies.   Social History   Socioeconomic History   Marital status: Married    Spouse name: Not on file   Number of children: Not on file   Years of education: Not on file   Highest education level: Not on file  Occupational History   Not on file  Tobacco Use   Smoking status: Never    Passive exposure: Never   Smokeless tobacco: Never  Vaping Use   Vaping Use: Never used  Substance and Sexual Activity   Alcohol use: Not Currently    Comment: RARE   Drug use: No   Sexual activity: Yes    Birth control/protection: Condom  Other Topics Concern   Not on file  Social History Narrative   Not on file   Social Determinants of Health   Financial Resource Strain: Not on file  Food Insecurity: Not on file  Transportation Needs: Not on file  Physical Activity: Not on file  Stress: Not on file  Social Connections: Not on file     Family History: The patient's family history includes Diabetes in her maternal grandmother; Hyperlipidemia in her mother; Hypertension in her maternal grandmother and mother. There is no history of  Colon cancer, Esophageal cancer, Rectal cancer, or Stomach cancer.  ROS:   Please see the history of present illness.    (+) Palpitations All other systems reviewed and are negative.  EKGs/Labs/Other Studies Reviewed:    The following studies were reviewed today:  LE Doppler Study 04/06/2021: Summary:  Right: Resting right ankle-brachial index is within normal range. No  evidence of significant right lower extremity arterial disease. The right  toe-brachial index is normal.   Left: Resting left ankle-brachial index is within normal range. No  evidence of significant left lower extremity arterial disease. The left  toe-brachial index is normal.   Monitor 02/2021: Monitor shows sinus rhythm - there were 25 episodes of SVT, sometimes with rates into the 150-180 bpm range, which likely explain recurrent palpitations  Echocardiogram  02/17/2021: Sonographer Comments: Consulted with Dr. Johney Frame. Dr. Johney Frame spoke with patient. She will notify [place] Day PA and Dr. Kipp Brood  the surgeon.  Recommends additional imaging  IMPRESSIONS    1. The patient has a known history of type A aortic dissection s/p repair  with 37m Hemashield Platinum graft and aortic valve resuspension.   2. There is a dissection flap well visualized in the abdominal aorta with  flow into the false lumen detected with color doppler. Will likely need  CTA imaging (prior CT did not include abdominal imaging) to evaluate  further.   3. Left ventricular ejection fraction, by estimation, is 65 to 70%. The  left ventricle has normal function. The left ventricle has no regional  wall motion abnormalities. There is moderate concentric left ventricular  hypertrophy. Left ventricular  diastolic parameters were normal.   4. Right ventricular systolic function is normal. The right ventricular  size is normal.   5. A small pericardial effusion is present. The pericardial effusion is  mostlly localized around the RA and RV.  There is no evidence of cardiac  tamponade.   6. The mitral valve is normal in structure. Trivial mitral valve  regurgitation.   7. The aortic valve is tricuspid. Aortic valve regurgitation is trivial.  No aortic stenosis is present.   8. The inferior vena cava is normal in size with greater than 50%  respiratory variability, suggesting right atrial pressure of 3 mmHg.   UE Venous Duplex 01/14/2021: Summary:     Left:  No evidence of deep vein thrombosis in the upper extremity. Findings  consistent with age indeterminate superficial vein thrombosis involving the left Cephalic vein.   CT-A Chest 01/08/2021: COMPARISON:  01/08/2021   FINDINGS: Cardiovascular: This is a technically adequate evaluation of the pulmonary vasculature. No filling defects or pulmonary emboli.   There is a type A thoracic aortic dissection, extending from the aortic root through the diaphragmatic hiatus. The distal margin of the aortic dissection is not visualized due to slice selection. The true lumen gives rise to the great vessels. I do not see any evidence of aortic rupture. Maximal diameter of the ascending thoracic aorta measures 5.1 cm.   The heart is unremarkable without pericardial effusion.   Mediastinum/Nodes: No enlarged mediastinal, hilar, or axillary lymph nodes. Thyroid gland, trachea, and esophagus demonstrate no significant findings.   Lungs/Pleura: No acute airspace disease, effusion, or pneumothorax. Central airways are patent. Minimal hypoventilatory changes at the lung bases.   Upper Abdomen: Limited imaging through the upper abdomen demonstrates no additional findings.   Musculoskeletal: There are no acute or destructive bony lesions. Reconstructed images demonstrate no additional findings.   Review of the MIP images confirms the above findings.   IMPRESSION: 1. 5.1 cm ascending thoracic aortic aneurysm, with superimposed type A thoracic aortic dissection. Dissection  extends from the aortic root through the diaphragmatic hiatus. The inferior margin of the dissection is not visualized due to slice selection. 2. No evidence of pulmonary embolus.   Critical Value/emergent results were called by telephone at the time of interpretation on 01/08/2021 at 5:09 pm [facility] who verbally acknowledged these results.   Echo Intraoperative TEE 151/70/0174 Complications: No known complications during this procedure.  POST-OP IMPRESSIONS  _ Left Ventricle: The left ventricle is unchanged from pre-bypass. has  normal systolic function, with an ejection fraction of 60%. The cavity size was normal. The wall motion is normal.  _ Right Ventricle: The right ventricle appears unchanged from pre-bypass.  _ Aorta: A graft was placed in the ascending aorta for repair.Type A  dissection repair with hemiarch.  _ Aortic Valve: There is no regurgitation.  _ Mitral Valve: The mitral valve appears unchanged from pre-bypass.  _ Tricuspid Valve: The tricuspid valve appears unchanged from pre-bypass.  _ Pulmonic Valve: The pulmonic valve appears unchanged from pre-bypass.  _ Interatrial Septum: The interatrial septum appears unchanged from  pre-bypass.  _ Pericardium: The pericardium appears unchanged from pre-bypass.  _ Comments: Post-bypass images reviewed with surgeon.     EKG:   EKG is personally reviewed. 10/26/2021: Sinus rhythm. Rate 69 bpm. 02/11/2021: EKG was not ordered.  Recent Labs: 01/10/2021: Magnesium 2.9 01/29/2021: BNP 183.9 03/09/2021: BUN 10; Creatinine, Ser 0.91; Hemoglobin 12.1; Platelets 467; Potassium 4.1; Sodium 142 04/16/2021: ALT 12   Recent Lipid Panel    Component Value Date/Time   CHOL 183 04/16/2021 1230   TRIG 57 04/16/2021 1230   HDL 65 04/16/2021 1230   CHOLHDL 2.8 04/16/2021 1230   CHOLHDL 3.3 01/08/2021 1435   VLDL 10 01/08/2021 1435   LDLCALC 107 (H) 04/16/2021 1230     Risk Assessment/Calculations:            Physical Exam:    Wt Readings from Last 3 Encounters:  10/26/21 190 lb (86.2 kg)  10/01/21 187 lb 6.4 oz (85 kg)  07/06/21 186 lb 14.4 oz (84.8 kg)     VS:  BP 136/84 (BP Location: Left Arm, Patient Position: Sitting, Cuff Size: Normal)   Pulse 69   Ht '5\' 10"'$  (1.778 m)   Wt 190 lb (86.2 kg)   LMP 05/31/2012   BMI 27.26 kg/m  , BMI Body mass index is 27.26 kg/m. GENERAL:  Well appearing HEENT: Pupils equal round and reactive, fundi not visualized, oral mucosa unremarkable NECK:  No jugular venous distention, waveform within normal limits, carotid upstroke brisk and symmetric, +carotid bruits, no thyromegaly LUNGS:  Clear to auscultation bilaterally HEART:  RRR.  PMI not displaced or sustained,S1 and S2 within normal limits, no S3, no S4, no clicks, no rubs, III/VI systolic murmur at the LUSB ABD: Flat, positive bowel sounds normal in frequency in pitch, no bruits, no rebound, no guarding, no midline pulsatile mass, no hepatomegaly, no splenomegaly EXT:  2 plus pulses throughout, no edema, no cyanosis no clubbing SKIN:  No rashes no nodules NEURO:  Cranial nerves II through XII grossly intact, motor grossly intact throughout PSYCH:  Cognitively intact, oriented to person place and time   ASSESSMENT:    1. Essential hypertension   2. SVT (supraventricular tachycardia) ([place])   3. Type 1 dissection of ascending aorta ([place])   4. Pure hypercholesterolemia     PLAN:    Essential hypertension Blood pressure has been well-controlled. Continue carvedilol, amlodipine, and losartan.  She usually takes her medications at 8:00 but they are at her office and she has not yet taken them.  Therefore we will not add any changes to her regimen at this time.  SVT (supraventricular tachycardia) ([place]) She continues to have some palpitations at times.  No change since increasing carvedilol.  She notes it when she is relaxing.  Episodes last a couple seconds.  Continue carvedilol.  Type 1  dissection of ascending aorta She underwent surgical repair with Dr. Kipp Brood.  She has had some expansion of the descending portion of the aorta after her repair.  She is working with Dr. Kipp Brood for surveillance.  We discussed the importance of maintaining adequate blood pressure control.  We also discussed not doing any physical exercise that would increase her intra thoracic pressure.  We  have referred her to the women's heart support group.  Hyperlipidemia Continue rosuvastatin.   Disposition: FU with Shelina Luo C. Oval Linsey, MD, [facility] in 6 months.  Medication Adjustments/Labs and Tests Ordered: Current medicines are reviewed at length with the patient today.  Concerns regarding medicines are outlined above.   Orders Placed This Encounter  Procedures   EKG 12-Lead   No orders of the defined types were placed in this encounter.  Patient Instructions  Medication Instructions:  Continue current medications  *If you need a refill on your cardiac medications before your next appointment, please call your pharmacy*   Lab Work: None Ordered   Testing/Procedures: None Ordered   Follow-Up: At [facility], you and your health needs are our priority.  As part of our continuing mission to provide you with exceptional heart care, we have created designated Provider Care Teams.  These Care Teams include your primary Cardiologist (physician) and Advanced Practice Providers (APPs -  Physician Assistants and Nurse Practitioners) who all work together to provide you with the care you need, when you need it.  We recommend signing up for the patient portal called "MyChart".  Sign up information is provided on this After Visit Summary.  MyChart is used to connect with patients for Virtual Visits (Telemedicine).  Patients are able to view lab/test results, encounter notes, upcoming appointments, etc.  Non-urgent messages can be sent to your provider as well.   To learn more about what you can  do with MyChart, go to [email or web address].    Your next appointment:   6 month(s)  The format for your next appointment:   In Person  Provider:   Skeet Latch, MD    Other Instructions   Important Information About Sugar         I,Mathew Stumpf,acting as a scribe for Skeet Latch, MD.,have documented all relevant documentation on the behalf of Skeet Latch, MD,as directed by  Skeet Latch, MD while in the presence of Skeet Latch, MD.  I, [place] Oval Linsey, MD have reviewed all documentation for this visit.  The documentation of the exam, diagnosis, procedures, and orders on 10/26/2021 are all accurate and complete.  Signed, Skeet Latch, MD  10/26/2021 9:01 AM    [place]

## 2021-10-26 NOTE — Assessment & Plan Note (Addendum)
She underwent surgical repair with Dr. Kipp Brood.  She has had some expansion of the descending portion of the aorta after her repair.  She is working with Dr. Kipp Brood for surveillance.  We discussed the importance of maintaining adequate blood pressure control.  We also discussed not doing any physical exercise that would increase her intra thoracic pressure.  We have referred her to the women's heart support group.

## 2021-11-01 ENCOUNTER — Encounter (HOSPITAL_BASED_OUTPATIENT_CLINIC_OR_DEPARTMENT_OTHER): Payer: Self-pay

## 2021-11-01 ENCOUNTER — Telehealth (HOSPITAL_COMMUNITY): Payer: Self-pay | Admitting: Licensed Clinical Social Worker

## 2021-11-01 NOTE — Telephone Encounter (Signed)
Pt referred to discuss Heart Strong Womens support group- CSW attempted to call to discuss- left VM requesting return call  Jorge Ny, [place] Worker [facility] Desk#: [phone] Cell#: [phone]

## 2021-11-04 NOTE — Telephone Encounter (Signed)
Okay to refill 12.'5mg'$  BID dosing?

## 2021-11-04 NOTE — Telephone Encounter (Signed)
Chart reviewed. Okay to Rx coreg 12.'5mg'$  BID.   Loel Dubonnet, NP

## 2021-11-05 MED ORDER — CARVEDILOL 12.5 MG PO TABS: 12.5000 mg | ORAL_TABLET | Freq: Two times a day (BID) | ORAL | 3 refills | Status: DC

## 2021-11-05 NOTE — Telephone Encounter (Signed)
Rx to pharmacy, pt. Notified     "Chart reviewed. Okay to Rx coreg 12.'5mg'$  BID.    Loel Dubonnet, NP "

## 2021-11-26 ENCOUNTER — Telehealth: Payer: Self-pay | Admitting: Primary Care

## 2021-11-26 ENCOUNTER — Ambulatory Visit: Payer: BC Managed Care – PPO | Admitting: Primary Care

## 2021-11-26 ENCOUNTER — Ambulatory Visit (INDEPENDENT_AMBULATORY_CARE_PROVIDER_SITE_OTHER): Payer: BC Managed Care – PPO | Admitting: Pulmonary Disease

## 2021-11-26 DIAGNOSIS — J438 Other emphysema: Secondary | ICD-10-CM

## 2021-11-26 DIAGNOSIS — R0609 Other forms of dyspnea: Secondary | ICD-10-CM

## 2021-11-26 LAB — PULMONARY FUNCTION TEST
DL/VA % pred: 117 %
DL/VA: 4.85 ml/min/mmHg/L
DLCO cor % pred: 63 %
DLCO cor: 16.09 ml/min/mmHg
DLCO unc % pred: 63 %
DLCO unc: 16.09 ml/min/mmHg
FEF 25-75 Post: 2.04 L/sec
FEF 25-75 Pre: 1.31 L/sec
FEF2575-%Change-Post: 55 %
FEF2575-%Pred-Post: 67 %
FEF2575-%Pred-Pre: 43 %
FEV1-%Change-Post: 10 %
FEV1-%Pred-Post: 51 %
FEV1-%Pred-Pre: 46 %
FEV1-Post: 1.73 L
FEV1-Pre: 1.57 L
FEV1FVC-%Change-Post: 4 %
FEV1FVC-%Pred-Pre: 95 %
FEV6-%Change-Post: 4 %
FEV6-%Pred-Post: 51 %
FEV6-%Pred-Pre: 49 %
FEV6-Post: 2.16 L
FEV6-Pre: 2.05 L
FEV6FVC-%Pred-Post: 102 %
FEV6FVC-%Pred-Pre: 102 %
FVC-%Change-Post: 6 %
FVC-%Pred-Post: 51 %
FVC-%Pred-Pre: 48 %
FVC-Post: 2.18 L
FVC-Pre: 2.05 L
Post FEV1/FVC ratio: 79 %
Post FEV6/FVC ratio: 100 %
Pre FEV1/FVC ratio: 76 %
Pre FEV6/FVC Ratio: 100 %
RV % pred: 59 %
RV: 1.26 L
TLC % pred: 57 %
TLC: 3.44 L

## 2021-11-26 NOTE — Progress Notes (Signed)
Full PFT performed today. °

## 2021-11-26 NOTE — Patient Instructions (Signed)
Full PFT performed today. °

## 2021-11-26 NOTE — Telephone Encounter (Signed)
Patient has to cancel OV with BW due to provider out sick- There are no openings until 9/21 and patient would like to know results sooner. Please call with PFT results as soon as possible.

## 2021-11-29 NOTE — Telephone Encounter (Signed)
Called patient and she would like the results of PFT  Please advise

## 2021-11-29 NOTE — Telephone Encounter (Signed)
Alpha 1 level was was normal. PFTs showed moderate decrease in lung function/ diffusion capacity related to emphysema. Should follow up with MD if symptomatic.

## 2021-11-29 NOTE — Telephone Encounter (Signed)
Called and went over results but she wanted to talk to [place]. Have her set up for mychart visit with beth Thursday. Nothing further needed

## 2021-12-02 ENCOUNTER — Telehealth (INDEPENDENT_AMBULATORY_CARE_PROVIDER_SITE_OTHER): Payer: BC Managed Care – PPO | Admitting: Primary Care

## 2021-12-02 DIAGNOSIS — R0609 Other forms of dyspnea: Secondary | ICD-10-CM | POA: Diagnosis not present

## 2021-12-02 DIAGNOSIS — J438 Other emphysema: Secondary | ICD-10-CM | POA: Diagnosis not present

## 2021-12-02 DIAGNOSIS — J90 Pleural effusion, not elsewhere classified: Secondary | ICD-10-CM

## 2021-12-02 NOTE — Progress Notes (Signed)
Virtual Visit via Video Note  I connected with Catherine Mcdonald on 12/02/21 at 11:00 AM EDT by a video enabled telemedicine application and verified that I am speaking with the correct person using two identifiers.  Location: Patient: Home Provider: Office    I discussed the limitations of evaluation and management by telemedicine and the availability of in person appointments. The patient expressed understanding and agreed to proceed.  History of Present Illness:  51 year old female, never smoked.  Past medical history significant for hypertension, thoracic aortic aneurysm, hyperlipidemia, pleural effusion.  Patient of Dr. Valeta Harms, last seen in office on 03/03/2021 for post-op right pleural effusion.  During that visit patient underwent thoracentesis.  Post procedure chest x-ray showed resolution of right pleural effusion, no normal pneumothorax.  10/01/2021 Patient presents today for overdue follow-up.  PCP advised patient follow-up with our office due to emphysema.  Patient had a CT chest in April 2023 that showed trace bilateral pleural effusions, right greater than left, significantly decreased in size from comparison.  Mild centrilobular emphysema. She is a never smoker, no second hand exposure. She has mild dyspnea symptoms with exercise which is improving. No cough or wheezing.   12/02/2021- Interim hx  Patient contacted today for virtual video visit to review PFT results.  Mild centrilobular emphysema on imaging in April. She is a never smoker. No second hand exposure. Alpha-1 phenotype was normal.  She had AAA repair for acute dissection by Dr. Kipp Brood in October 2022 and was seen for postop pleural effusion by Dr. Valeta Harms in December 2022 and underwent thoracentesis.  Pulmonary function testing showed moderate restriction, this likely secondary to postop changes.  No evidence of COPD.  She has no symptoms consistent with asthma.  Mild dyspnea with hills/inclines.  No chest tightness or  wheezing.  She has a rare dry cough.  Function testing FVC 2.18 (51%), FEV1 1.73 (51%), ratio 76, DLCO 16.09 (63%)    Observations/Objective:  Appears well. Able to speak in full sentences.  No overt shortness of breath, wheezing or cough  Assessment and Plan:  Emphysema: -CTA in April 2023 showed mild emphysema and trace bilateral pleural effusions.  Patient is a never smoker without secondhand smoke exposure.  She is minimally symptomatic.  Denies chest tightness or wheezing.  She has mild dyspnea symptoms with hills/inclines and rare dry cough.  We checked alpha-1 phenotype which was normal.  Pulmonary function testing showed no evidence of obstructive lung disease.  She does have moderate restrictive lung disease with moderate diffusion defect.  She did have an NSTEMI and AAA repair in October 2022 with Dr. Kipp Brood.  She had postop right sided pleural effusion and underwent a thoracentesis with Dr. Valeta Harms in December 2022.  I do recommend she have a high-resolution CAT scan to better evaluate lung parenchymal and reassess for emphysema.  She will follow-up with Dr. Valeta Harms after CT imaging.   Follow Up Instructions:  1 Month with Dr. Valeta Harms or sooner   I discussed the assessment and treatment plan with the patient. The patient was provided an opportunity to ask questions and all were answered. The patient agreed with the plan and demonstrated an understanding of the instructions.   The patient was advised to call back or seek an in-person evaluation if the symptoms worsen or if the condition fails to improve as anticipated.  I provided 28 minutes of non-face-to-face time during this encounter.   Martyn Ehrich, NP

## 2021-12-02 NOTE — Patient Instructions (Signed)
Pulmonary function testing showed moderate restriction in lung function.  No evidence of obstructive lung disease.  Alpha-1 type was normal.    Recommend checking high-resolution CAT scan to relook at parenchymal lung tissue and reassess mild emphysema that was seen on CT imaging in April  Follow-up First available with Dr. Valeta Harms either 10/25, 10/26 or 11/1

## 2022-01-04 ENCOUNTER — Ambulatory Visit
Admission: RE | Admit: 2022-01-04 | Discharge: 2022-01-04 | Disposition: A | Discharge status: Home / Self Care | Payer: BC Managed Care – PPO | Source: Ambulatory Visit | Attending: Primary Care | Admitting: Primary Care

## 2022-01-04 DIAGNOSIS — J438 Other emphysema: Secondary | ICD-10-CM

## 2022-01-04 DIAGNOSIS — J90 Pleural effusion, not elsewhere classified: Secondary | ICD-10-CM

## 2022-01-04 DIAGNOSIS — R0609 Other forms of dyspnea: Secondary | ICD-10-CM

## 2022-01-06 ENCOUNTER — Ambulatory Visit (INDEPENDENT_AMBULATORY_CARE_PROVIDER_SITE_OTHER): Payer: BC Managed Care – PPO | Admitting: Pulmonary Disease

## 2022-01-06 ENCOUNTER — Encounter: Payer: Self-pay | Admitting: Pulmonary Disease

## 2022-01-06 ENCOUNTER — Encounter (HOSPITAL_BASED_OUTPATIENT_CLINIC_OR_DEPARTMENT_OTHER): Payer: Self-pay | Admitting: Cardiovascular Disease

## 2022-01-06 VITALS — BP 124/74 | HR 64 | Temp 98.3°F | Ht 70.0 in | Wt 190.8 lb

## 2022-01-06 DIAGNOSIS — I7101 Dissection of ascending aorta: Secondary | ICD-10-CM

## 2022-01-06 DIAGNOSIS — R0609 Other forms of dyspnea: Secondary | ICD-10-CM | POA: Diagnosis not present

## 2022-01-06 DIAGNOSIS — J984 Other disorders of lung: Secondary | ICD-10-CM | POA: Diagnosis not present

## 2022-01-06 MED ORDER — ALBUTEROL SULFATE HFA 108 (90 BASE) MCG/ACT IN AERS: 2.0000 | INHALATION_SPRAY | Freq: Four times a day (QID) | RESPIRATORY_TRACT | 6 refills | Status: DC | PRN

## 2022-01-06 NOTE — Progress Notes (Signed)
Synopsis: Referred in December 2022 for pleural effusion by Robyne Peers, MD  Subjective:   PATIENT ID: Catherine Mcdonald GENDER: female DOB: 06/06/1970, MRN: [id number]  Chief Complaint  Patient presents with   Follow-up    Discuss CT from 01/04/2022.    This is a 51 year old female, past medical history of hypertension, hyperlipidemia.Patient referred for evaluation of pleural effusion.  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 Kipp Brood.  Her postop chest x-ray on 02/26/2021 for follow-up in the clinic revealed a persistent right-sided pleural effusion and a small effusion on the left.  Patient was referred for evaluation for thoracentesis in the office.  OV 01/06/2022: Here today for follow-up.  She had ongoing dyspnea on exertion shortness of breath.  She ultimately had PFTs completed and HRCT imaging.Pulmonary function test revealed a FEV1 of 51% predicted, normal ratio, TLC of 57% predicted, DLCO of 63% predicted.  She does have restrictive lung disease related to her thoracic surgery most likely she does have some scarring left in the right lower lobe.  HRCT imaging with no evidence of interstitial lung disease.  She has some air trapping no obvious emphysema.    Past Medical History:  Diagnosis Date   Allergy    Anemia    had ekg/stress test/echo done in March at [facility] d/t symptoms of anemia-cardiac r/o per pt-records requested   Arthritis    Essential hypertension 02/11/2021   Headache(784.0)    History of blood transfusion 05/2012   [facility]   Hyperlipidemia    Hypertension    meds d/c about 1 year ago after starting exercise program with b/p controlled-pcp aware   Menorrhagia 03/01/2012   Pure hypercholesterolemia 02/11/2021   Seizures ([place])    as an infant with high fevers   Sickle cell trait (HCC)    SVT (supraventricular tachycardia) 10/26/2021     Family History  Problem Relation Age of Onset    Hyperlipidemia Mother    Hypertension Mother    Diabetes Maternal Grandmother    Hypertension Maternal Grandmother    Colon cancer Neg Hx    Esophageal cancer Neg Hx    Rectal cancer Neg Hx    Stomach cancer Neg Hx      Past Surgical History:  Procedure Laterality Date   CESAREAN SECTION     TEE WITHOUT CARDIOVERSION N/A 01/08/2021   Procedure: TRANSESOPHAGEAL ECHOCARDIOGRAM (TEE);  Surgeon: Lajuana Matte, MD;  Location: [place];  Service: Open Heart Surgery;  Laterality: N/A;   THORACIC AORTIC ANEURYSM REPAIR N/A 01/08/2021   Procedure: Repair of Acute Ascending Thoracic Aortic Dissection Using Hemashield Platinum Graft Size 28MM;  Surgeon: Lajuana Matte, MD;  Location: [place];  Service: Open Heart Surgery;  Laterality: N/A;   VAGINAL HYSTERECTOMY N/A 07/12/2012   Procedure: HYSTERECTOMY VAGINAL;  Surgeon: Ena Dawley, MD;  Location: [facility];  Service: Gynecology;  Laterality: N/A;   WISDOM TOOTH EXTRACTION      Social History   Socioeconomic History   Marital status: Married    Spouse name: Not on file   Number of children: Not on file   Years of education: Not on file   Highest education level: Not on file  Occupational History   Not on file  Tobacco Use   Smoking status: Never    Passive exposure: Never   Smokeless tobacco: Never  Vaping Use   Vaping Use: Never used  Substance and Sexual Activity  Alcohol use: Not Currently    Comment: RARE   Drug use: No   Sexual activity: Yes    Birth control/protection: Condom  Other Topics Concern   Not on file  Social History Narrative   Not on file   Social Determinants of Health   Financial Resource Strain: Not on file  Food Insecurity: Not on file  Transportation Needs: Not on file  Physical Activity: Not on file  Stress: Not on file  Social Connections: Not on file  Intimate Partner Violence: Not on file     No Known Allergies   Outpatient Medications Prior to Visit  Medication Sig Dispense  Refill   amLODipine (NORVASC) 5 MG tablet Take 2 tablets (10 mg total) by mouth daily. (Patient taking differently: Take 5 mg by mouth daily.) 180 tablet 3   aspirin 81 MG EC tablet Take 1 tablet (81 mg total) by mouth daily. 30 tablet 1   carvedilol (COREG) 12.5 MG tablet Take 1 tablet (12.5 mg total) by mouth 2 (two) times daily. 180 tablet 3   losartan (COZAAR) 25 MG tablet Take 1 tablet (25 mg total) by mouth daily. 90 tablet 3   psyllium (METAMUCIL) 58.6 % powder Take 1 packet by mouth daily.     rosuvastatin (CRESTOR) 40 MG tablet TAKE 1 TABLET(40 MG) BY MOUTH DAILY 90 tablet 2   No facility-administered medications prior to visit.    Review of Systems  Constitutional:  Negative for chills, fever, malaise/fatigue and weight loss.  HENT:  Negative for hearing loss, sore throat and tinnitus.   Eyes:  Negative for blurred vision and double vision.  Respiratory:  Positive for shortness of breath. Negative for cough, hemoptysis, sputum production, wheezing and stridor.   Cardiovascular:  Negative for chest pain, palpitations, orthopnea, leg swelling and PND.  Gastrointestinal:  Negative for abdominal pain, constipation, diarrhea, heartburn, nausea and vomiting.  Genitourinary:  Negative for dysuria, hematuria and urgency.  Musculoskeletal:  Negative for joint pain and myalgias.  Skin:  Negative for itching and rash.  Neurological:  Negative for dizziness, tingling, weakness and headaches.  Endo/Heme/Allergies:  Negative for environmental allergies. Does not bruise/bleed easily.  Psychiatric/Behavioral:  Negative for depression. The patient is not nervous/anxious and does not have insomnia.   All other systems reviewed and are negative.    Objective:  Physical Exam Vitals reviewed.  Constitutional:      General: She is not in acute distress.    Appearance: She is well-developed.  HENT:     Head: Normocephalic and atraumatic.  Eyes:     General: No scleral icterus.     Conjunctiva/sclera: Conjunctivae normal.     Pupils: Pupils are equal, round, and reactive to light.  Neck:     Vascular: No JVD.     Trachea: No tracheal deviation.  Cardiovascular:     Rate and Rhythm: Normal rate and regular rhythm.     Heart sounds: Normal heart sounds. No murmur heard. Pulmonary:     Effort: Pulmonary effort is normal. No tachypnea, accessory muscle usage or respiratory distress.     Breath sounds: No stridor. No wheezing, rhonchi or rales.  Abdominal:     General: There is no distension.     Palpations: Abdomen is soft.     Tenderness: There is no abdominal tenderness.  Musculoskeletal:        General: No tenderness.     Cervical back: Neck supple.  Lymphadenopathy:     Cervical: No cervical adenopathy.  Skin:    General: Skin is warm and dry.     Capillary Refill: Capillary refill takes less than 2 seconds.     Findings: No rash.  Neurological:     Mental Status: She is alert and oriented to person, place, and time.  Psychiatric:        Behavior: Behavior normal.      Vitals:   01/06/22 1038  BP: 124/74  Pulse: 64  Temp: 98.3 F (36.8 C)  TempSrc: Oral  SpO2: 98%  Weight: 190 lb 12.8 oz (86.5 kg)  Height: '5\' 10"'$  (1.778 m)   98% on RA BMI Readings from Last 3 Encounters:  01/06/22 27.38 kg/m  10/26/21 27.26 kg/m  10/01/21 26.89 kg/m   Wt Readings from Last 3 Encounters:  01/06/22 190 lb 12.8 oz (86.5 kg)  10/26/21 190 lb (86.2 kg)  10/01/21 187 lb 6.4 oz (85 kg)     CBC    Component Value Date/Time   WBC 6.5 03/09/2021 1500   WBC 16.1 (H) 01/17/2021 0534   RBC 5.39 (H) 03/09/2021 1500   RBC 3.02 (L) 01/17/2021 0534   HGB 12.1 03/09/2021 1500   HCT 41.3 03/09/2021 1500   PLT 467 (H) 03/09/2021 1500   MCV 77 (L) 03/09/2021 1500   MCH 22.4 (L) 03/09/2021 1500   MCH 26.5 01/17/2021 0534   MCHC 29.3 (L) 03/09/2021 1500   MCHC 30.9 01/17/2021 0534   RDW 15.9 (H) 03/09/2021 1500   LYMPHSABS 1.6 01/08/2021 1427   MONOABS 0.6  01/08/2021 1427   EOSABS 0.1 01/08/2021 1427   BASOSABS 0.0 01/08/2021 1427    Chest Imaging: Chest x-ray December 2022: Right-sided pleural effusion The patient's images have been independently reviewed by me.  HRCT imaging October 2023: Right lower lobe scarring evidence of air trapping no obvious emphysema. The patient's images have been independently reviewed by me.    Pulmonary Functions Testing Results:    Latest Ref Rng & Units 11/26/2021    2:11 PM  PFT Results  FVC-Pre L 2.05   FVC-Predicted Pre % 48   FVC-Post L 2.18   FVC-Predicted Post % 51   Pre FEV1/FVC % % 76   Post FEV1/FCV % % 79   FEV1-Pre L 1.57   FEV1-Predicted Pre % 46   FEV1-Post L 1.73   DLCO uncorrected ml/min/mmHg 16.09   DLCO UNC% % 63   DLCO corrected ml/min/mmHg 16.09   DLCO COR %Predicted % 63   DLVA Predicted % 117   TLC L 3.44   TLC % Predicted % 57   RV % Predicted % 59    FeNO:   Pathology:   Echocardiogram:   Heart Catheterization:     Assessment & Plan:     ICD-10-CM   1. Dyspnea on exertion  R06.09     2. Type 1 dissection of ascending aorta (HCC)  I71.010     3. Restrictive lung disease  J98.4        Discussion:  Patient was initially seen postop right-sided pleural effusion status post type I dissection repair by Dr. Kipp Brood.  Patient has restrictive lung disease now I suspect related postop thoracic surgery with a reduced TLC and reduced spirometry.  She has shortness of breath HRCT imaging with no evidence of ILD and she does have evidence of air trapping predominant in the upper lobes and some right lower lobe scarring.  Plan: We will give her an albuterol to use prior to exertion. We will see  if this helps with any of her shortness of breath symptoms I think a component of this is likely related to her deconditioning after having such a major surgery and recovering. Otherwise doing well.    Current Outpatient Medications:    albuterol (VENTOLIN HFA) 108  (90 Base) MCG/ACT inhaler, Inhale 2 puffs into the lungs every 6 (six) hours as needed for wheezing or shortness of breath., Disp: 8 g, Rfl: 6   amLODipine (NORVASC) 5 MG tablet, Take 2 tablets (10 mg total) by mouth daily. (Patient taking differently: Take 5 mg by mouth daily.), Disp: 180 tablet, Rfl: 3   aspirin 81 MG EC tablet, Take 1 tablet (81 mg total) by mouth daily., Disp: 30 tablet, Rfl: 1   carvedilol (COREG) 12.5 MG tablet, Take 1 tablet (12.5 mg total) by mouth 2 (two) times daily., Disp: 180 tablet, Rfl: 3   losartan (COZAAR) 25 MG tablet, Take 1 tablet (25 mg total) by mouth daily., Disp: 90 tablet, Rfl: 3   psyllium (METAMUCIL) 58.6 % powder, Take 1 packet by mouth daily., Disp: , Rfl:    rosuvastatin (CRESTOR) 40 MG tablet, TAKE 1 TABLET(40 MG) BY MOUTH DAILY, Disp: 90 tablet, Rfl: 2   Garner Nash, DO [facility] 01/06/2022 10:55 AM

## 2022-01-06 NOTE — Patient Instructions (Signed)
Thank you for visiting Dr. Valeta Harms at [facility]. Today we recommend the following:  Meds ordered this encounter  Medications   albuterol (VENTOLIN HFA) 108 (90 Base) MCG/ACT inhaler    Sig: Inhale 2 puffs into the lungs every 6 (six) hours as needed for wheezing or shortness of breath.    Dispense:  8 g    Refill:  6   Use albuterol prior to any exertional events... hiking, exercise. Etc...   Return if symptoms worsen or fail to improve.    Please do your part to reduce the spread of COVID-19.

## 2022-01-07 NOTE — Telephone Encounter (Signed)
Albuterol inhaler used just as needed, does this pose any major heart rate elevation risks?

## 2022-02-11 IMAGING — CR DG CHEST 2V
2 series · 2 of 2 positions shown · non-contrast
Comparison: 01/16/2021

CLINICAL DATA: Congestive heart failure, shortness of breath for 2
weeks

EXAM:
CHEST - 2 VIEW

[w chest pa]
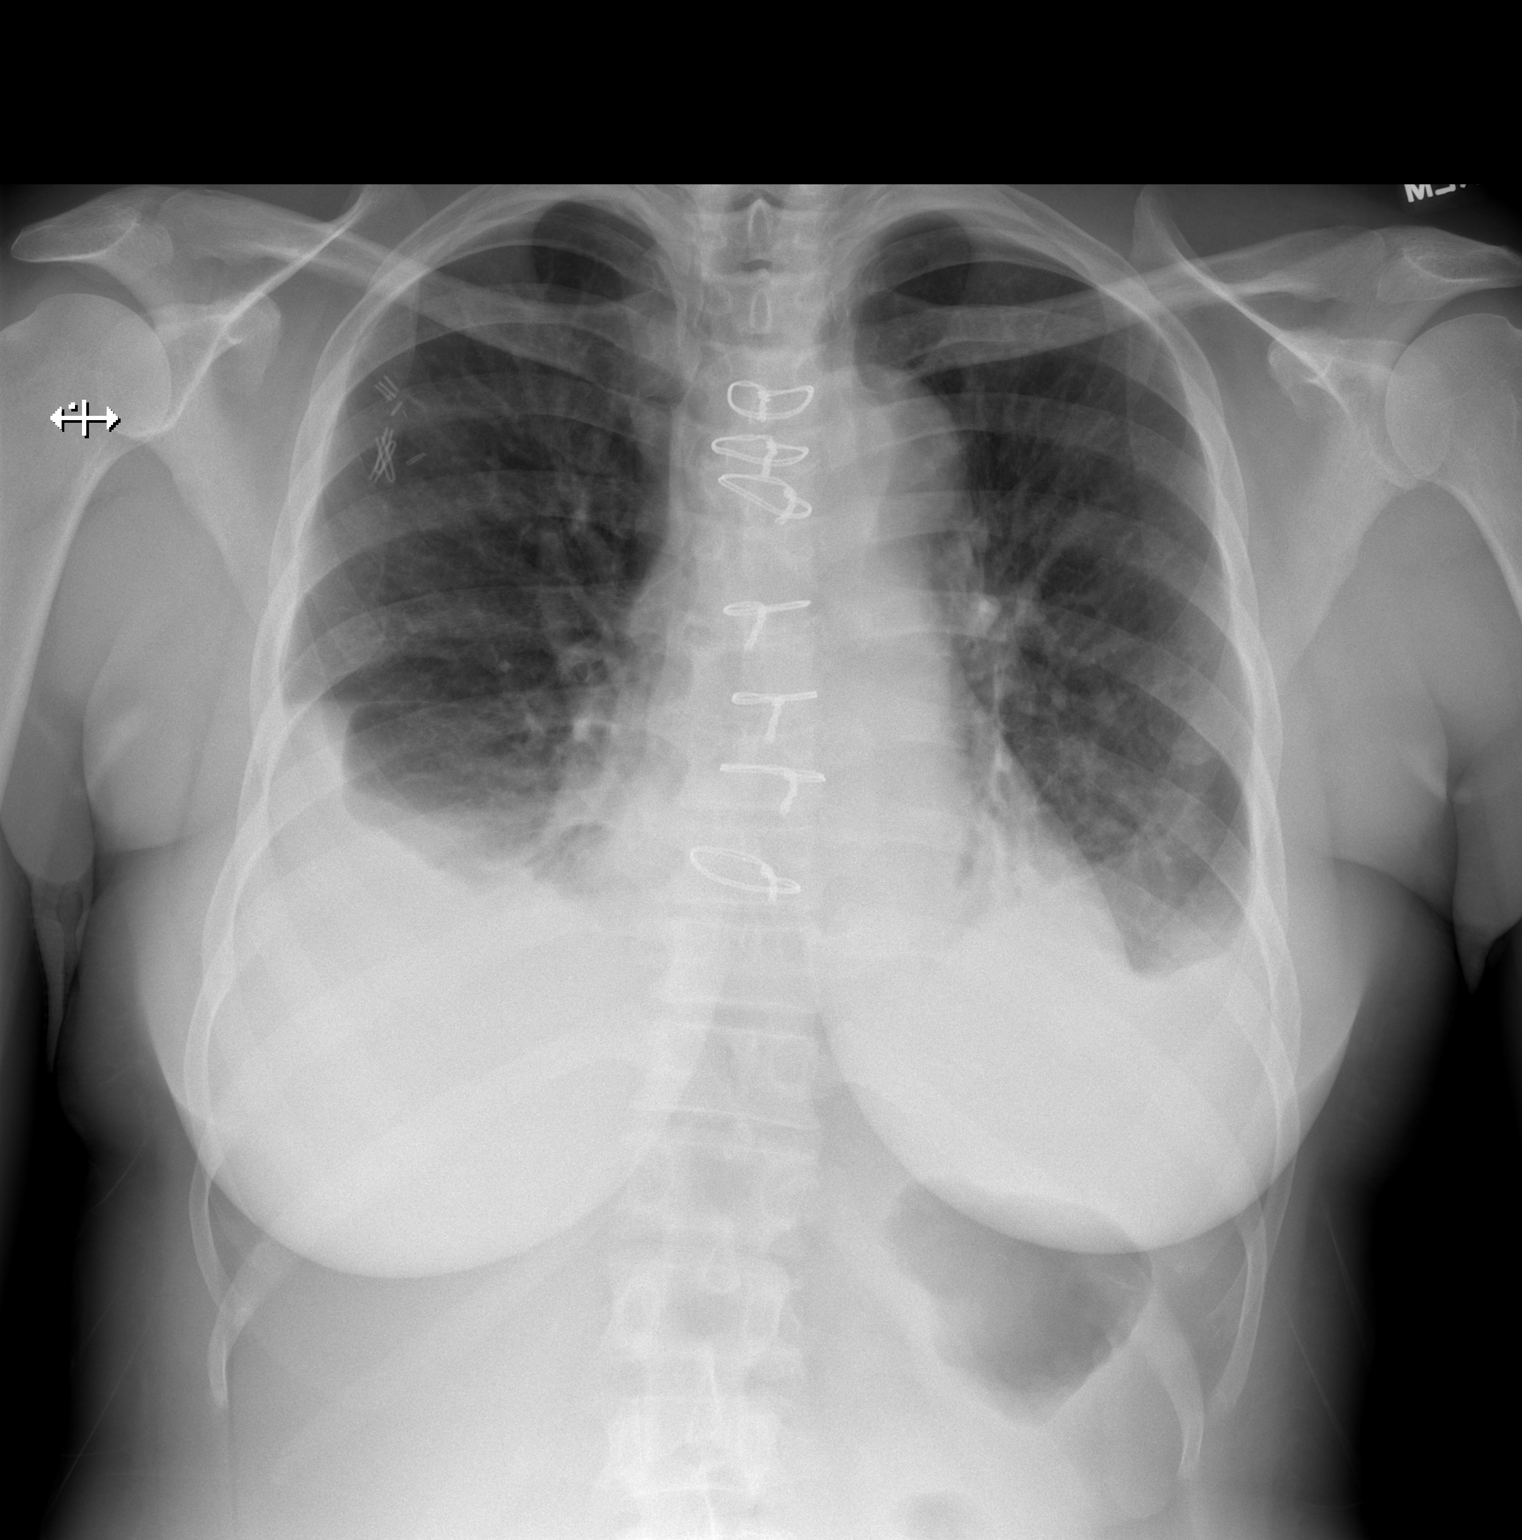

[w chest lat]
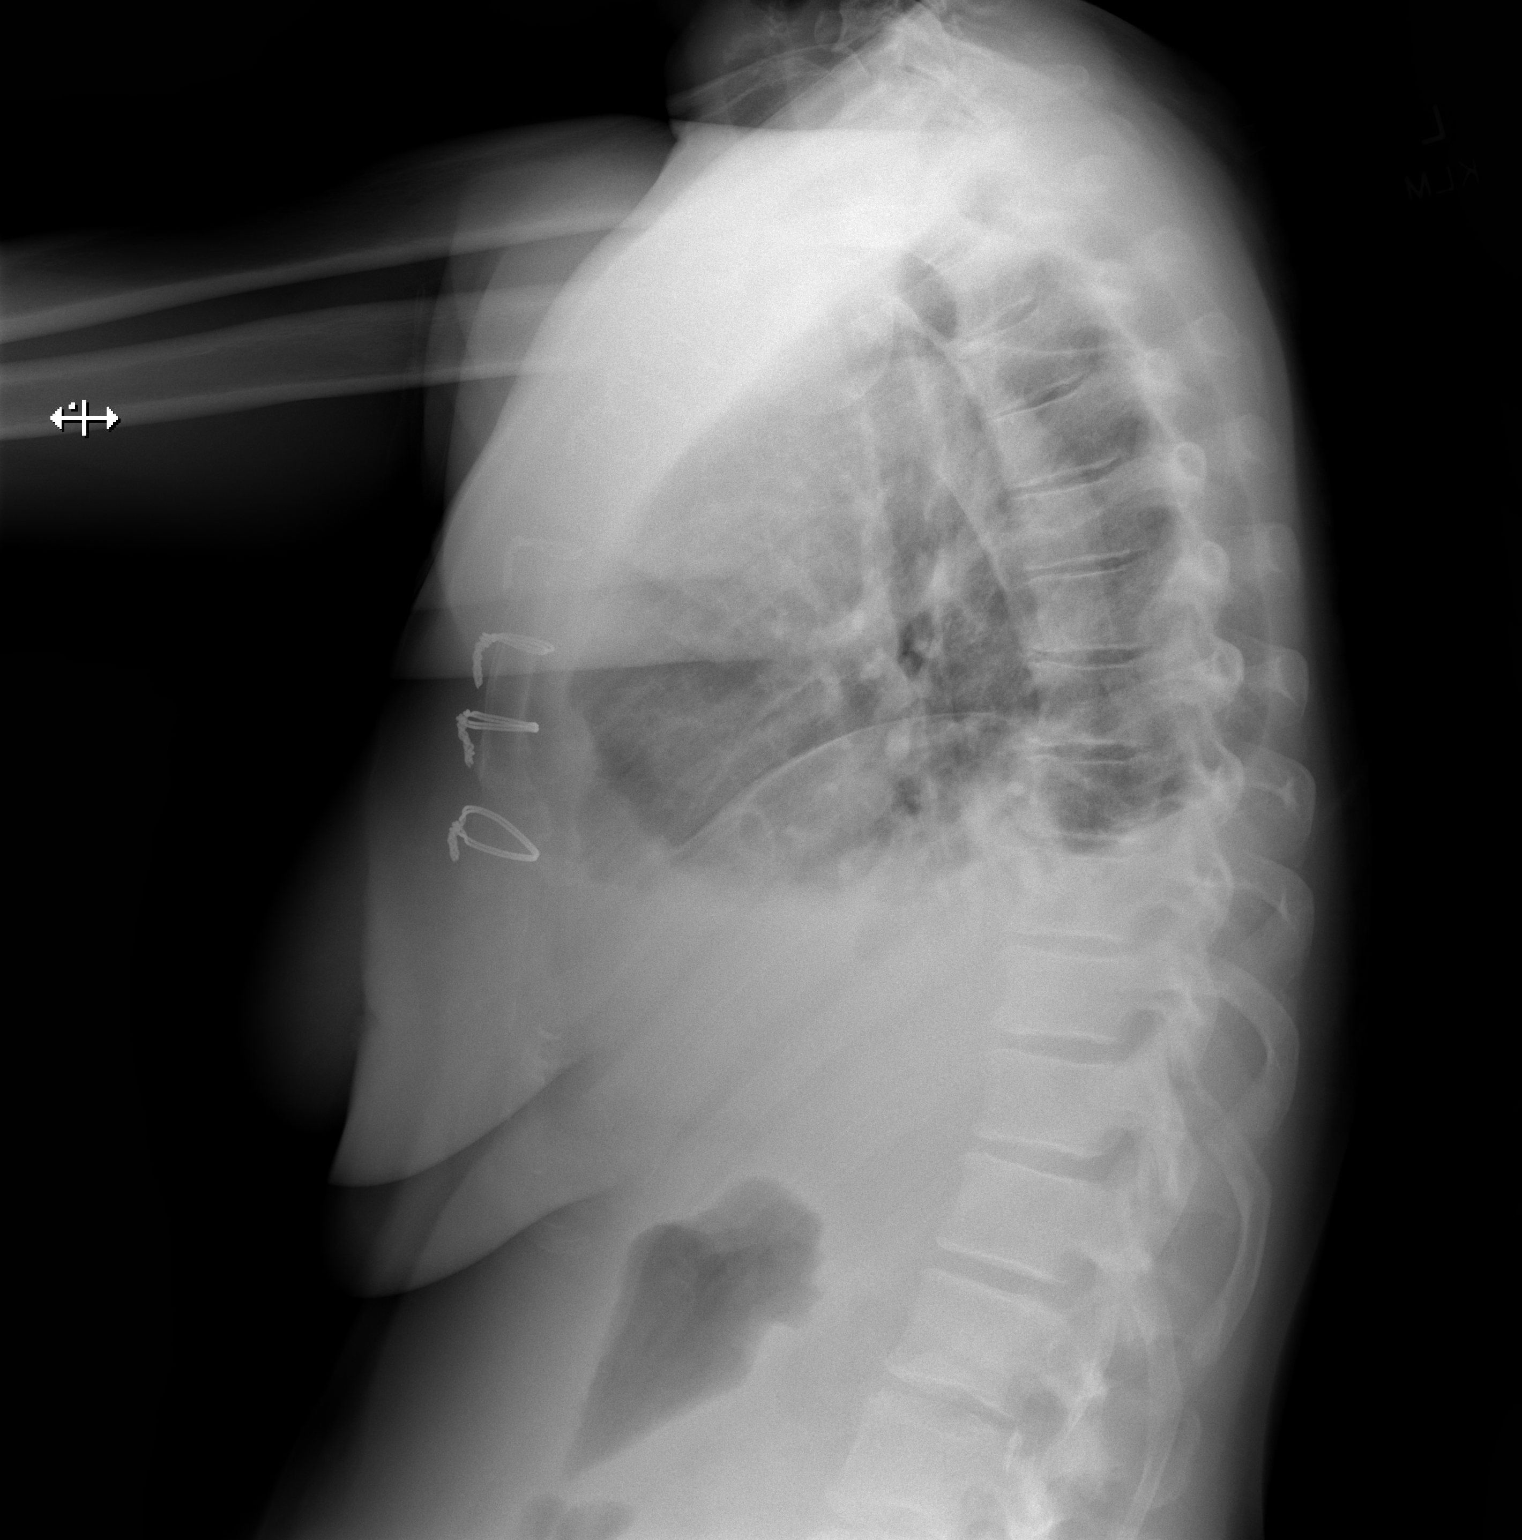

[2 of 2 positions shown; findings below may reference images not displayed]

FINDINGS: Small bilateral pleural effusions, right greater than left.
Bibasilar atelectasis. Mild bilateral interstitial thickening. No
pneumothorax. Stable cardiomegaly. Prior median sternotomy.

No acute osseous abnormality.
IMPRESSION: Cardiomegaly with pulmonary vascular congestion.

Bilateral small pleural effusions, right greater than left with
bibasilar atelectasis.

## 2022-02-28 ENCOUNTER — Other Ambulatory Visit (HOSPITAL_BASED_OUTPATIENT_CLINIC_OR_DEPARTMENT_OTHER): Payer: Self-pay | Admitting: Cardiovascular Disease

## 2022-02-28 NOTE — Telephone Encounter (Signed)
Rx request sent to pharmacy.  

## 2022-05-23 ENCOUNTER — Other Ambulatory Visit: Payer: Self-pay | Admitting: Thoracic Surgery (Cardiothoracic Vascular Surgery)

## 2022-05-23 DIAGNOSIS — I712 Thoracic aortic aneurysm, without rupture, unspecified: Secondary | ICD-10-CM

## 2022-05-29 ENCOUNTER — Other Ambulatory Visit: Payer: Self-pay | Admitting: Cardiovascular Disease

## 2022-05-30 NOTE — Telephone Encounter (Signed)
Rx(s) sent to pharmacy electronically.  

## 2022-06-29 ENCOUNTER — Encounter: Payer: Self-pay | Admitting: Thoracic Surgery (Cardiothoracic Vascular Surgery)

## 2022-07-01 ENCOUNTER — Ambulatory Visit (INDEPENDENT_AMBULATORY_CARE_PROVIDER_SITE_OTHER): Payer: BC Managed Care – PPO | Admitting: Thoracic Surgery (Cardiothoracic Vascular Surgery)

## 2022-07-01 ENCOUNTER — Ambulatory Visit
Admission: RE | Admit: 2022-07-01 | Discharge: 2022-07-01 | Disposition: A | Discharge status: Home / Self Care | Payer: BC Managed Care – PPO | Source: Ambulatory Visit | Attending: Thoracic Surgery (Cardiothoracic Vascular Surgery) | Admitting: Thoracic Surgery (Cardiothoracic Vascular Surgery)

## 2022-07-01 DIAGNOSIS — I7121 Aneurysm of the ascending aorta, without rupture: Secondary | ICD-10-CM

## 2022-07-01 DIAGNOSIS — I712 Thoracic aortic aneurysm, without rupture, unspecified: Secondary | ICD-10-CM

## 2022-07-01 MED ORDER — IOPAMIDOL (ISOVUE-370) INJECTION 76%
75.0000 mL | Freq: Once | INTRAVENOUS | Status: AC | PRN
  Administered 2022-07-01: 75 mL via INTRAVENOUS

## 2022-07-01 NOTE — Progress Notes (Signed)
     [address]       [place] [address]             [phone]       Patient: Home Provider: Office Consent for Telemedicine visit obtained.  Today's visit was completed via a real-time telehealth (see specific modality noted below). The patient/authorized person provided oral consent at the time of the visit to engage in a telemedicine encounter with the present provider at [facility]. The patient/authorized person was informed of the potential benefits, limitations, and risks of telemedicine. The patient/authorized person expressed understanding that the laws that protect confidentiality also apply to telemedicine. The patient/authorized person acknowledged understanding that telemedicine does not provide emergency services and that he or she would need to call 911 or proceed to the nearest hospital for help if such a need arose.   Total time spent in the clinical discussion 10 minutes.  Telehealth Modality: Phone visit (audio only)  Recent Radiology Findings:   No results found.  I had a telephone visit with Mrs. Mcgloin.  Overall, she is doing well.    Assessment:  52 year old female status post type a dissection repair with chronic dissection along her arch and descending aorta.  I personally reviewed her images and did not see much change in regards to the size of her large descending aortic aneurysms.  She is currently being followed in our hypertension clinic and reports adequate blood pressures at home.  We discussed the natural history and and risk factors for growth of ascending aortic aneurysms.  We covered the importance of smoking cessation, tight blood pressure control, refraining from lifting heavy objects, and avoiding fluoroquinolones.  The patient is aware of signs and symptoms of aortic dissection and when to present to the emergency department.  We will continue surveillance and a repeat CT was ordered for 12 months.  Ilse Billman Keane Scrape

## 2022-08-10 ENCOUNTER — Other Ambulatory Visit (HOSPITAL_BASED_OUTPATIENT_CLINIC_OR_DEPARTMENT_OTHER): Payer: Self-pay | Admitting: Cardiovascular Disease

## 2022-08-10 NOTE — Telephone Encounter (Signed)
Rx request sent to pharmacy.  

## 2022-11-08 ENCOUNTER — Other Ambulatory Visit (HOSPITAL_BASED_OUTPATIENT_CLINIC_OR_DEPARTMENT_OTHER): Payer: Self-pay | Admitting: Cardiovascular Disease

## 2022-11-08 NOTE — Telephone Encounter (Signed)
Rx(s) sent to pharmacy electronically.  

## 2022-11-23 ENCOUNTER — Other Ambulatory Visit (HOSPITAL_BASED_OUTPATIENT_CLINIC_OR_DEPARTMENT_OTHER): Payer: Self-pay | Admitting: Family

## 2022-11-23 ENCOUNTER — Encounter (HOSPITAL_BASED_OUTPATIENT_CLINIC_OR_DEPARTMENT_OTHER): Payer: Self-pay | Admitting: Cardiovascular Disease

## 2022-12-26 MED ORDER — CARVEDILOL 12.5 MG PO TABS: 12.5000 mg | ORAL_TABLET | Freq: Two times a day (BID) | ORAL | 3 refills | Status: DC

## 2023-02-08 ENCOUNTER — Other Ambulatory Visit (HOSPITAL_BASED_OUTPATIENT_CLINIC_OR_DEPARTMENT_OTHER): Payer: Self-pay | Admitting: Cardiovascular Disease

## 2023-02-14 ENCOUNTER — Other Ambulatory Visit (HOSPITAL_BASED_OUTPATIENT_CLINIC_OR_DEPARTMENT_OTHER): Payer: Self-pay | Admitting: Cardiovascular Disease

## 2023-02-14 MED ORDER — LOSARTAN POTASSIUM 25 MG PO TABS: 25.0000 mg | ORAL_TABLET | Freq: Every day | ORAL | 0 refills | Status: DC

## 2023-02-14 MED ORDER — ROSUVASTATIN CALCIUM 40 MG PO TABS: 40.0000 mg | ORAL_TABLET | Freq: Every day | ORAL | 0 refills | Status: DC

## 2023-03-21 ENCOUNTER — Ambulatory Visit (HOSPITAL_BASED_OUTPATIENT_CLINIC_OR_DEPARTMENT_OTHER): Payer: 59 | Admitting: Family

## 2023-03-21 ENCOUNTER — Encounter (HOSPITAL_BASED_OUTPATIENT_CLINIC_OR_DEPARTMENT_OTHER): Payer: Self-pay | Admitting: Family

## 2023-03-21 VITALS — BP 128/72 | HR 64 | Ht 70.0 in | Wt 202.1 lb

## 2023-03-21 DIAGNOSIS — I471 Supraventricular tachycardia, unspecified: Secondary | ICD-10-CM | POA: Diagnosis not present

## 2023-03-21 DIAGNOSIS — E782 Mixed hyperlipidemia: Secondary | ICD-10-CM | POA: Diagnosis not present

## 2023-03-21 DIAGNOSIS — I10 Essential (primary) hypertension: Secondary | ICD-10-CM | POA: Diagnosis not present

## 2023-03-21 DIAGNOSIS — R0609 Other forms of dyspnea: Secondary | ICD-10-CM

## 2023-03-21 MED ORDER — ROSUVASTATIN CALCIUM 40 MG PO TABS: 40.0000 mg | ORAL_TABLET | Freq: Every day | ORAL | 3 refills | Status: DC

## 2023-03-21 MED ORDER — ALBUTEROL SULFATE HFA 108 (90 BASE) MCG/ACT IN AERS: 2.0000 | INHALATION_SPRAY | Freq: Four times a day (QID) | RESPIRATORY_TRACT | 6 refills | Status: AC | PRN

## 2023-03-21 MED ORDER — LOSARTAN POTASSIUM 25 MG PO TABS: 25.0000 mg | ORAL_TABLET | Freq: Every day | ORAL | 3 refills | Status: AC

## 2023-03-21 MED ORDER — AMLODIPINE BESYLATE 5 MG PO TABS
5.0000 mg | ORAL_TABLET | Freq: Every day | ORAL | 3 refills | Status: DC
Start: 2023-03-21 — End: 2023-11-27

## 2023-03-21 MED ORDER — CARVEDILOL 12.5 MG PO TABS: 12.5000 mg | ORAL_TABLET | Freq: Two times a day (BID) | ORAL | 3 refills | Status: AC

## 2023-03-21 NOTE — Patient Instructions (Addendum)
 Medication Instructions:  Continue your current medications.   *If you need a refill on your cardiac medications before your next appointment, please call your pharmacy*   Follow-Up: At [facility], you and your health needs are our priority.  As part of our continuing mission to provide you with exceptional heart care, we have created designated Provider Care Teams.  These Care Teams include your primary Cardiologist (physician) and Advanced Practice Providers (APPs -  Physician Assistants and Nurse Practitioners) who all work together to provide you with the care you need, when you need it.  We recommend signing up for the patient portal called MyChart.  Sign up information is provided on this After Visit Summary.  MyChart is used to connect with patients for Virtual Visits (Telemedicine).  Patients are able to view lab/test results, encounter notes, upcoming appointments, etc.  Non-urgent messages can be sent to your provider as well.   To learn more about what you can do with MyChart, go to [email or web address].    Your next appointment:   1 year(s) with Dr. Raford  (If you prefer to schedule 6 month follow up to be able to book today, that is fine)   Other Instructions   To prevent palpitations: Make sure you are adequately hydrated.  Avoid and/or limit caffeine containing beverages like soda or tea. Exercise regularly.  Manage stress well. Some over the counter medications can cause palpitations such as Benadryl , AdvilPM, TylenolPM. Regular Advil  or Tylenol  do not cause palpitations.     Heart Healthy Diet Recommendations: A low-salt diet is recommended. Meats should be grilled, baked, or boiled. Avoid fried foods. Focus on lean protein sources like fish or chicken with vegetables and fruits. The American Heart Association is a Chief Technology Officer!  American Heart Association Diet and Lifeystyle Recommendations   Exercise recommendations: The American Heart  Association recommends 150 minutes of moderate intensity exercise weekly. Try 30 minutes of moderate intensity exercise 4-5 times per week. This could include walking, jogging, or swimming.

## 2023-03-21 NOTE — Progress Notes (Signed)
 Cardiology Office Note:  .   Date:  03/21/2023  ID:  Catherine Mcdonald, DOB January 08, 1971, MRN [id number] PCP: Catherine Bene, MD  Catherine Mcdonald Cardiologist:  Catherine Scarce, MD    History of Present Illness: .   Catherine Mcdonald is a 53 y.o. 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   Admitted 10/28 - 01/16/2021 with acute thoracic aortic dissection after presenting with acute onset left neck and chest pain.  Initially ruled in for NSTEMI with ST depressions in inferior leads.  However CT scan showed type a dissection stenting from aortic root to the diaphragmatic hiatus.  Surgery consulted and underwent emergent surgery with Catherine Mcdonald with aortic dissection repair with 8 mm graft and aortic valve resuspension and replacement of ascending aorta and arch.   Echo 01/2021 with LVEF 65 to 75%, moderate LVH, small pericardial effusion but no significant valvular disease.  Also showed dissection flap in the abdominal aorta with flow into false lumen.  CTA chest/abdomen/pelvis ordered for further evaluation revealing residual dissection flap extends from just beyond surgical repair to the left iliac artery, small focus of contrast lateral to the noncoronary cusp of the aortic valve, proximal to the anastomosis at the sinotubular junction of uncertain significance and linear contrast along caudal aortic arch which abuts the distal anastomosis proximally and continues with false channel distally.  ZIO with underlying sinus rhythm with 25 episodes for SVT which was likely etiology of palpitations.   Presents today for follow-up with her husband. Pleasant lady who works at [employer], community health. BP  at home routinely  <130/80. Reports rare flutters most often at rest that self resolve. Exercising regularly at the gym with a combination of aerobics and weight training. Notes dyspnea on exertion only with more than usual exercise.    ROS: Please see the history of present illness.    All other systems reviewed and are negative.   Studies Reviewed: SABRA   EKG Interpretation Date/Time:  Tuesday March 21 2023 08:43:23 EST Ventricular Rate:  57 PR Interval:  188 QRS Duration:  74 QT Interval:  466 QTC Calculation: 453 R Axis:   -4  Text Interpretation: Sinus bradycardia No acute changes Confirmed by Vannie Mora ([id number]) on 03/21/2023 8:45:20 AM    Cardiac Studies & Procedures      ECHOCARDIOGRAM  ECHOCARDIOGRAM COMPLETE 02/17/2021  Narrative ECHOCARDIOGRAM REPORT    Patient Name:   Catherine Mcdonald Date of Exam: 02/17/2021 Medical Rec #:  [id number]         Height:       70.0 in Accession #:    [id number]        Weight:       201.4 lb Date of Birth:  [birth year]         BSA:          2.094 m Patient Age:    50 years          BP:           12/89 mmHg Patient Gender: F                 HR:           79 bpm. Exam Location:  Catherine Mcdonald  Procedure:  2D Echo, Cardiac Doppler and Color Doppler  Indications:    I50.31 CHF  History:        Patient has prior history of Echocardiogram examinations, most recent 01/08/2021. Previous Myocardial Infarction, Aortic Dissection and Type A dissection; Risk Factors:Hypertension and Dyslipidemia. _ Aorta Dissection, Repair and Graft; Date:01/08/2021 Graft Type: 28 mm Hemashield Platinum graft. Type A dissection.  Sonographer:    Catherine Mcdonald Referring Phys: [id number] Catherine Mcdonald   Sonographer Comments: Consulted with Catherine Mcdonald. Catherine Mcdonald spoke with patient. She will notify [facility] and Catherine Mcdonald the surgeon. Recommends additional imaging IMPRESSIONS   1. The patient has a known history of type A aortic  dissection s/p repair with 28mm Hemashield Platinum graft and aortic valve resuspension. 2. There is a dissection flap well visualized in the abdominal aorta with flow into the false lumen detected with color doppler. Will likely need CTA imaging (prior CT did not include abdominal imaging) to evaluate further. 3. Left ventricular ejection fraction, by estimation, is 65 to 70%. The left ventricle has normal function. The left ventricle has no regional wall motion abnormalities. There is moderate concentric left ventricular hypertrophy. Left ventricular diastolic parameters were normal. 4. Right ventricular systolic function is normal. The right ventricular size is normal. 5. A small pericardial effusion is present. The pericardial effusion is mostlly localized around the RA and RV. There is no evidence of cardiac tamponade. 6. The mitral valve is normal in structure. Trivial mitral valve regurgitation. 7. The aortic valve is tricuspid. Aortic valve regurgitation is trivial. No aortic stenosis is present. 8. The inferior vena cava is normal in size with greater than 50% respiratory variability, suggesting right atrial pressure of 3 mmHg.  FINDINGS Left Ventricle: Left ventricular ejection fraction, by estimation, is 65 to 70%. The left ventricle has normal function. The left ventricle has no regional wall motion abnormalities. The left ventricular internal cavity size was normal in size. There is moderate concentric left ventricular hypertrophy. Left ventricular diastolic parameters were normal.  Right Ventricle: The right ventricular size is normal. No increase in right ventricular wall thickness. Right ventricular systolic function is normal.  Left Atrium: Left atrial size was normal in size.  Right Atrium: Right atrial size was normal in size.  Pericardium: A small pericardial effusion is present. The pericardial effusion is mostlly localized around the RA and RV. There is no evidence of  cardiac tamponade.  Mitral Valve: The mitral valve is normal in structure. Trivial mitral valve regurgitation.  Tricuspid Valve: The tricuspid valve is normal in structure. Tricuspid valve regurgitation is mild.  Aortic Valve: The aortic valve is tricuspid. Aortic valve regurgitation is trivial. No aortic stenosis is present. Aortic valve mean gradient measures 5.0 mmHg. Aortic valve peak gradient measures 9.1 mmHg. Aortic valve area, by VTI measures 2.10 cm.  Pulmonic Valve: The pulmonic valve was normal in structure. Pulmonic valve regurgitation is trivial.  Aorta: Patient is s/p type A aortic dissection repair with 28mm graft and aortic valve resuspension. There is a dissection flap visualized in the abdominal aorta with flow into the false lumen detected by color doppler. The aortic root/ascending aorta has been repaired/replaced.  Venous: The inferior vena cava is normal in size with greater than 50% respiratory variability, suggesting right atrial pressure of 3 mmHg.  IAS/Shunts: No atrial level shunt detected by color flow Doppler.   LEFT VENTRICLE PLAX 2D LVIDd:         4.20 cm   Diastology LVIDs:  2.40 cm   LV e' medial:    9.46 cm/s LV PW:         1.30 cm   LV E/e' medial:  14.5 LV IVS:        1.30 cm   LV e' lateral:   14.50 cm/s LVOT diam:     2.00 cm   LV E/e' lateral: 9.4 LV SV:         70 LV SV Index:   34 LVOT Area:     3.14 cm   RIGHT VENTRICLE RV Basal diam:  3.70 cm RV S prime:     11.90 cm/s TAPSE (M-mode): 1.6 cm RVSP:           17.9 mmHg  LEFT ATRIUM             Index        RIGHT ATRIUM           Index LA diam:        3.00 cm 1.43 cm/m   RA Pressure: 3.00 mmHg LA Vol (A2C):   46.5 ml 22.21 ml/m  RA Area:     12.70 cm LA Vol (A4C):   48.3 ml 23.07 ml/m  RA Volume:   29.90 ml  14.28 ml/m LA Biplane Vol: 46.8 ml 22.35 ml/m AORTIC VALVE AV Area (Vmax):    2.08 cm AV Area (Vmean):   2.21 cm AV Area (VTI):     2.10 cm AV Vmax:            151.00 cm/s AV Vmean:          107.000 cm/s AV VTI:            0.335 m AV Peak Grad:      9.1 mmHg AV Mean Grad:      5.0 mmHg LVOT Vmax:         99.80 cm/s LVOT Vmean:        75.300 cm/s LVOT VTI:          0.224 m LVOT/AV VTI ratio: 0.67  AORTA Ao Root diam: 3.40 cm Ao Asc diam:  3.00 cm  MITRAL VALVE                TRICUSPID VALVE MV Area (PHT): 4.10 cm     TR Peak grad:   14.9 mmHg MV Decel Time: 185 msec     TR Vmax:        193.00 cm/s MV E velocity: 137.00 cm/s  Estimated RAP:  3.00 mmHg MV A velocity: 68.10 cm/s   RVSP:           17.9 mmHg MV E/A ratio:  2.01 SHUNTS Systemic VTI:  0.22 m Systemic Diam: 2.00 cm  Heather  Pemberton MD Electronically signed by Winston Sorrow MD Signature Date/Time: 02/17/2021/3:57:21 PM    Final  TEE  ECHO INTRAOPERATIVE TEE 01/08/2021  Narrative *INTRAOPERATIVE TRANSESOPHAGEAL REPORT *    Patient Name:   FRANCI OSHANA Date of Exam: 01/08/2021 Medical Rec #:  [id number]         Height:       70.0 in Accession #:    [id number]        Weight:       210.0 lb Date of Birth:  [birth year]         BSA:          2.13 m Patient Age:    49 years          BP:  143/73 mmHg Patient Gender: F                 HR:           80 bpm. Exam Location:  Anesthesiology  Transesophogeal exam was perform intraoperatively during surgical procedure. Patient was closely monitored under general anesthesia during the entirety of examination.  Indications:     Type A Dissection Sonographer:     Naomie Reef Performing Phys: Garnette Skillern MD  Complications: No known complications during this procedure. POST-OP IMPRESSIONS _ Left Ventricle: The left ventricle is unchanged from pre-bypass. has normal systolic function, with an ejection fraction of 60%. The cavity size was normal. The wall motion is normal. _ Right Ventricle: The right ventricle appears unchanged from pre-bypass. _ Aorta: A graft was placed in the ascending aorta for  repair.Type A dissection repair with hemiarch. _ Aortic Valve: There is no regurgitation. _ Mitral Valve: The mitral valve appears unchanged from pre-bypass. _ Tricuspid Valve: The tricuspid valve appears unchanged from pre-bypass. _ Pulmonic Valve: The pulmonic valve appears unchanged from pre-bypass. _ Interatrial Septum: The interatrial septum appears unchanged from pre-bypass. _ Pericardium: The pericardium appears unchanged from pre-bypass. _ Comments: Post-bypass images reviewed with surgeon.  PRE-OP FINDINGS Left Ventricle: The left ventricle has normal systolic function, with an ejection fraction of 60-65%. The cavity size was normal. There is moderate concentric left ventricular hypertrophy.   Right Ventricle: The right ventricle has normal systolic function. The cavity was normal. There is increased right ventricular wall thickness.  Left Atrium: Left atrial size was normal in size. No left atrial/left atrial appendage thrombus was detected. Left atrial appendage velocity is normal at greater than 40 cm/s.  Right Atrium: Right atrial size was normal in size.  Interatrial Septum: No atrial level shunt detected by color flow Doppler.  Pericardium: There is no evidence of pericardial effusion.  Mitral Valve: The mitral valve is normal in structure. Mitral valve regurgitation is trivial by color flow Doppler.  Tricuspid Valve: The tricuspid valve was normal in structure. Tricuspid valve regurgitation was not visualized by color flow Doppler.  Aortic Valve: The aortic valve is tricuspid Aortic valve regurgitation is severe by color flow Doppler. There is no stenosis of the aortic valve.   Pulmonic Valve: The pulmonic valve was normal in structure. Pulmonic valve regurgitation is not visualized by color flow Doppler.   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 There is evidence of a dissection in the aortic root, ascending aorta and aortic arch.  The dissection can be classified as a Stanford type A (proximal). The dissection entry point is at the aortic root. A false lumen can be seen that contains thrombus.   Garnette Skillern MD Electronically signed by Garnette Skillern MD Signature Date/Time: 01/12/2021/8:44:51 AM    Final  MONITORS  LONG TERM MONITOR (3-14 DAYS) 02/18/2021  Narrative Monitor shows sinus rhythm - there were 25 episodes of SVT, sometimes with rates into the 150-180 bpm range, which likely explain recurrent palpitations  Vinie KYM Maxcy, MD, [facility], FACP [place]  [facility] Medical Director of the [facility]ovascular [facility] Diplomate of the American Board of Clinical Lipidology Attending Cardiologist Direct Dial: [phone]  Fax: [phone] Website:  [email or web address]           Risk Assessment/Calculations:             Physical Exam:   VS:  BP 128/72 (BP Location: Right Arm, Patient  Position: Sitting, Cuff Size: Normal)   Pulse 64   Ht 5' 10 (1.778 m)   Wt 202 lb 1.6 oz (91.7 kg)   LMP 05/31/2012   SpO2 97%   BMI 29.00 kg/m    Wt Readings from Last 3 Encounters:  03/21/23 202 lb 1.6 oz (91.7 kg)  01/06/22 190 lb 12.8 oz (86.5 kg)  10/26/21 190 lb (86.2 kg)    GEN: Well nourished, well developed in no acute distress NECK: No JVD; No carotid bruits CARDIAC: RRR, no murmurs, rubs, gallops RESPIRATORY:  Clear to auscultation without rales, wheezing or rhonchi  ABDOMEN: Soft, non-tender, non-distended EXTREMITIES:  No edema; No deformity   ASSESSMENT AND PLAN: .    TAA s/p Type A Dissection repair 02/2021 - Follows with [facility] Catherine Mcdonald. 06/2022 CT stable 4.6 cm fusiformaneurysm of prximal descending thoracic aorta. Due for follow up 06/2023.  Continue optimal BP, lipid control. Continue beta blocker (Coreg ). Avoid fluoroquinolones.   HTN - BP well controlled. Continue current antihypertensive regimen Amlodipine  to 5 mg daily, losartan  25 mg daily,  Coreg  12.5 mg twice daily.  Refills provided. BP goal <130/80.  Venous insufficiency - Reports mild ankle edema by late afternoon. Encouraged elevation, compression stockings.    Palpitations / SVT -prior monitor with occasional episodes of SVT. Rare palpitations which are not bothersome.  Continue carvedilol  12.5 mg twice daily.   HLD - Continue Crestor  40mg  daily. Refill provided.   Exertional dyspnea - Resolved with PRN Albuterol  needing about twice per week. Refill provided. If symptoms worsen, encouraged to discuss with PCP or pulmonology.          Dispo: follow up in 1 year  Signed, Reche GORMAN Finder, NP

## 2023-05-18 ENCOUNTER — Other Ambulatory Visit: Payer: Self-pay | Admitting: Thoracic Surgery (Cardiothoracic Vascular Surgery)

## 2023-05-18 DIAGNOSIS — I7121 Aneurysm of the ascending aorta, without rupture: Secondary | ICD-10-CM

## 2023-06-15 ENCOUNTER — Ambulatory Visit
Admission: RE | Admit: 2023-06-15 | Discharge: 2023-06-15 | Disposition: A | Discharge status: Home / Self Care | Source: Ambulatory Visit | Attending: Thoracic Surgery (Cardiothoracic Vascular Surgery) | Admitting: Thoracic Surgery (Cardiothoracic Vascular Surgery)

## 2023-06-15 DIAGNOSIS — I7121 Aneurysm of the ascending aorta, without rupture: Secondary | ICD-10-CM

## 2023-06-16 NOTE — Progress Notes (Signed)
 [address]       [place] [address]             [phone]    PCP is Bernadette Hoit, MD Referring Provider is Bernadette Hoit, MD  Reason for Office visit: Previous aortic dissection repair 2022, surveillance   HPI: This is a 53 year old female with a past medical history of hypertension, hyperlipidemia, seizures, Sickle cell trait, SVT, and aortic dissection (s/p right axillary artery cannulation with 8 mm graft, antegrade cerebral perfusion, aortic valve re suspension, Type A aortic dissection repair with 28 mm graft type A dissection repair with hemi arch on 01/09/2021 by Dr. Cliffton Asters. She has been followed by Dr. Cliffton Asters since then, with her last phone appointment on 07/01/2022. At that time, per Dr. Cliffton Asters, CTA showed did not show much change in regards to the size of her large descending aortic aneurysm  and 4 mm right middle lobe pulmonary nodule. Patient denies chest pain, pressure, or tightness.  Past Medical History:  Diagnosis Date   Allergy    Anemia    had ekg/stress test/echo done in March at [facility] d/t symptoms of anemia-cardiac r/o per pt-records requested   Arthritis    Essential hypertension 02/11/2021   Headache(784.0)    History of blood transfusion 05/2012   [facility]   Hyperlipidemia    Hypertension    meds d/c about 1 year ago after starting exercise program with b/p controlled-pcp aware   Menorrhagia 03/01/2012   Pure hypercholesterolemia 02/11/2021   Seizures (HCC)    as an infant with high fevers   Sickle cell trait (HCC)    SVT (supraventricular tachycardia) (HCC) 10/26/2021    Past Surgical History:  Procedure Laterality Date   CESAREAN SECTION     TEE WITHOUT CARDIOVERSION N/A 01/08/2021   Procedure: TRANSESOPHAGEAL ECHOCARDIOGRAM (TEE);  Surgeon: Corliss Skains, MD;  Location: [facility] OR;  Service: Open Heart Surgery;  Laterality: N/A;   THORACIC AORTIC ANEURYSM REPAIR N/A 01/08/2021    Procedure: Repair of Acute Ascending Thoracic Aortic Dissection Using Hemashield Platinum Graft Size ;  Surgeon: Corliss Skains, MD;  Location: [facility] OR;  Service: Open Heart Surgery;  Laterality: N/A;   VAGINAL HYSTERECTOMY N/A 07/12/2012   Procedure: HYSTERECTOMY VAGINAL;  Surgeon: Kirkland Hun, MD;  Location: [facility] ORS;  Service: Gynecology;  Laterality: N/A;   WISDOM TOOTH EXTRACTION      Family History  Problem Relation Age of Onset   Hyperlipidemia Mother    Hypertension Mother    Diabetes Maternal Grandmother    Hypertension Maternal Grandmother    Colon cancer Neg Hx    Esophageal cancer Neg Hx    Rectal cancer Neg Hx    Stomach cancer Neg Hx     Social History Social History   Tobacco Use   Smoking status: Never    Passive exposure: Never   Smokeless tobacco: Never  Vaping Use   Vaping status: Never Used  Substance Use Topics   Alcohol use: Not Currently    Comment: RARE   Drug use: No    Current Outpatient Medications  Medication Sig Dispense Refill   albuterol (VENTOLIN HFA) 108 (90 Base) MCG/ACT inhaler Inhale 2 puffs into the lungs every 6 (six) hours as needed for wheezing or shortness of breath. 8 g 6   amLODipine (NORVASC) 5 MG tablet Take 1 tablet (5 mg total) by mouth daily. 90 tablet 3   aspirin 81 MG EC tablet  Take 1 tablet (81 mg total) by mouth daily. 30 tablet 1   carvedilol (COREG) 12.5 MG tablet Take 1 tablet (12.5 mg total) by mouth 2 (two) times daily with a meal. 180 tablet 3   losartan (COZAAR) 25 MG tablet Take 1 tablet (25 mg total) by mouth daily. 90 tablet 3   psyllium (METAMUCIL) 58.6 % powder Take 1 packet by mouth daily.     rosuvastatin (CRESTOR) 40 MG tablet Take 1 tablet (40 mg total) by mouth daily. 90 tablet 3  Allergies: No Known Allergies  Vital Signs:  Physical Exam CV- Neck- Pulmonary- Abdomen- Extremities- Neurologic-  Diagnostic Tests: Narrative & Impression  CLINICAL DATA:  Aortic aneurysm suspected    EXAM: CT CHEST WITHOUT CONTRAST   TECHNIQUE: Multidetector CT imaging of the chest was performed following the standard protocol without IV contrast.   RADIATION DOSE REDUCTION: This exam was performed according to the departmental dose-optimization program which includes automated exposure control, adjustment of the mA and/or kV according to patient size and/or use of iterative reconstruction technique.   COMPARISON:  July 01, 2022, July 02, 2021, March 12, 2021, January 08, 2021   FINDINGS: Cardiovascular: No cardiomegaly or pericardial effusion. Aortic tube graft replacement of the ascending aorta. No perigraft fluid or inflammation. The aortic arch remains dilated. The proximal arch measures 3.6 cm and the distal arch measures 4.6 cm. The descending aorta measures 4.8 cm. At the level of the diaphragmatic hiatus, the aorta remains dilated measuring 4.4 cm. The intimal flap related to the underlying aortic dissection is not evaluated due to the lack of intravenous contrast.   Mediastinum/Nodes: No mediastinal mass. Scattered subcentimeter multi station mediastinal lymph nodes, likely reactive. No hilar or axillary lymphadenopathy.   Lungs/Pleura: The midline trachea and bronchi are patent. No focal airspace consolidation, pleural effusion, or pneumothorax. Unchanged right basilar pleural thickening with multifocal bilateral lower lobe scarring and atelectasis. Subpleural right middle lobe nodule measuring 5 mm (previously, 4 mm).   Musculoskeletal: No acute fracture or destructive bone lesion. Sternotomy wires without dehiscence. Multilevel degenerative disc disease of the spine.   Upper Abdomen: No acute abnormality in the partially visualized upper abdomen.   IMPRESSION: 1. Redemonstrated tube graft repair of the ascending aorta. No perigraft fluid collection or inflammation. Similar aneurysmal dilation of the aortic arch and descending aorta extending to  the level of the diaphragmatic hiatus. 2. No pneumonia, pulmonary edema, or pleural effusion. 3. Right middle lobe nodule measures 5 mm (previously, 4 mm). Given the relative stability, this is likely benign.   Aortic aneurysm NOS (ICD10-I71.9).     Electronically Signed   By: Wallie Char M.D.   On: 06/15/2023 15:35      Risk Modification in those with ascending thoracic aortic aneurysm:  Continue good control of blood pressure (prefer SBP 130/80 or less)-continue Carvedilol (Coreg), Losartan (Cozaar), and Amlodipine (Norvasc)  2. Avoid fluoroquinolone antibiotics (I.e Ciprofloxacin, Avelox, Levofloxacin, Ofloxacin)  3.  Use of statin (to decrease cardiovascular risk)-continue Crestor (Rosuvastatin)  4.  Exercise and activity limitations is individualized, but in general, contact sports are to be  avoided and one should avoid heavy lifting (defined as half of ideal body weight) and exercises involving sustained Valsalva maneuver.  5. Counseling for those suspected of having genetically mediated disease. First-degree relatives of those with TAA disease should be screened as well as those who have a connective tissue disease (I.e with Marfan syndrome, Ehlers-Danlos syndrome,  and Loeys-Dietz syndrome) or a  bicuspid aortic  valve,have an increased risk for  complications related to TAA. She denies family history of TAA and personal history of connective tissue disorder.  Echo done 02/17/2021 showed LVEF 65-70%, aortic valve is tricuspid, trivial aortic valve regurgitation, and no aortic stenosis is present.   6. Patient does not have tobacco abuse history   Impression and Plan: CT of chest shows re demonstrated tube graft repair of the ascending aorta, no perigraft fluid collection or inflammation, similar aneurysmal dilation of the aortic arch and descending aorta (was 4.6 cm 2024 and now 4.8 cm) extending to the level of the diaphragmatic hiatus.  Regarding right middle  lobe nodule that now measures 5 mm (previously, 4 mm), she is followed by Dr. Tonia Brooms. Given the relative stability, this is likely benign (per radiology). Echocardiogram done 02/17/2021 showed a tri leaflet valve with trivial aortic regurgitation.  We discussed the natural history and and risk factors for growth of ascending aortic aneurysms.  We covered the importance of tight blood pressure control, refraining from lifting heavy objects, and avoiding fluoroquinolones.  The patient is aware of signs and symptoms of aortic dissection and when to present to the emergency department.  We will continue surveillance and a repeat CTA of the chest was ordered for 1 year.   Ardelle Balls, PA-C [facility] [phone]

## 2023-06-16 NOTE — Progress Notes (Signed)
 [address]       [place] [address]             [phone]     CARDIOTHORACIC SURGERY TELEPHONE VIRTUAL OFFICE NOTE  Referring Provider is Amadeo June, MD Primary Cardiologist is Maudine Sos, MD PCP is Amadeo June, MD   HPI:  I spoke with Catherine Mcdonald (DOB 1970/10/22 ) via telephone on 06/16/2023 at 12:30 PM and verified that I was speaking with the correct person using more than one form of identification.  We discussed the reason(s) for conducting our visit virtually instead of in-person.  The patient expressed understanding the circumstances and agreed to proceed as described.  This is a 53 year old female with a past medical history of hypertension, hyperlipidemia, seizures, Sickle cell trait, SVT, and aortic dissection (s/p right axillary artery cannulation with 8 mm graft, antegrade cerebral perfusion, aortic valve re suspension, Type A aortic dissection repair with 28 mm graft type A dissection repair with hemi arch on 01/09/2021 by Dr. Deloise Ferries. She has been followed by Dr. Deloise Ferries since then, with her last phone appointment on 07/01/2022. At that time, per Dr. Deloise Ferries, CTA showed did not show much change in regards to the size of her large descending aortic aneurysm and 4 mm right middle lobe pulmonary nodule. Patient denies chest pain, pressure, or tightness.   Current Outpatient Medications  Medication Sig Dispense Refill   albuterol (VENTOLIN HFA) 108 (90 Base) MCG/ACT inhaler Inhale 2 puffs into the lungs every 6 (six) hours as needed for wheezing or shortness of breath. 8 g 6   amLODipine (NORVASC) 5 MG tablet Take 1 tablet (5 mg total) by mouth daily. 90 tablet 3   aspirin 81 MG EC tablet Take 1 tablet (81 mg total) by mouth daily. 30 tablet 1   carvedilol (COREG) 12.5 MG tablet Take 1 tablet (12.5 mg total) by mouth 2 (two) times daily with a meal. 180 tablet 3   losartan (COZAAR) 25 MG tablet Take 1 tablet (25 mg total) by  mouth daily. 90 tablet 3   psyllium (METAMUCIL) 58.6 % powder Take 1 packet by mouth daily.     rosuvastatin (CRESTOR) 40 MG tablet Take 1 tablet (40 mg total) by mouth daily. 90 tablet 3    Diagnostic Tests:  Narrative & Impression  CLINICAL DATA:  Aortic aneurysm suspected   EXAM: CT CHEST WITHOUT CONTRAST   TECHNIQUE: Multidetector CT imaging of the chest was performed following the standard protocol without IV contrast.   RADIATION DOSE REDUCTION: This exam was performed according to the departmental dose-optimization program which includes automated exposure control, adjustment of the mA and/or kV according to patient size and/or use of iterative reconstruction technique.   COMPARISON:  July 01, 2022, July 02, 2021, March 12, 2021, January 08, 2021   FINDINGS: Cardiovascular: No cardiomegaly or pericardial effusion. Aortic tube graft replacement of the ascending aorta. No perigraft fluid or inflammation. The aortic arch remains dilated. The proximal arch measures 3.6 cm and the distal arch measures 4.6 cm. The descending aorta measures 4.8 cm. At the level of the diaphragmatic hiatus, the aorta remains dilated measuring 4.4 cm. The intimal flap related to the underlying aortic dissection is not evaluated due to the lack of intravenous contrast.   Mediastinum/Nodes: No mediastinal mass. Scattered subcentimeter multi station mediastinal lymph nodes, likely reactive. No hilar or axillary lymphadenopathy.   Lungs/Pleura: The midline trachea and bronchi are patent. No focal airspace  consolidation, pleural effusion, or pneumothorax. Unchanged right basilar pleural thickening with multifocal bilateral lower lobe scarring and atelectasis. Subpleural right middle lobe nodule measuring 5 mm (previously, 4 mm).   Musculoskeletal: No acute fracture or destructive bone lesion. Sternotomy wires without dehiscence. Multilevel degenerative disc disease of the spine.   Upper  Abdomen: No acute abnormality in the partially visualized upper abdomen.   IMPRESSION: 1. Redemonstrated tube graft repair of the ascending aorta. No perigraft fluid collection or inflammation. Similar aneurysmal dilation of the aortic arch and descending aorta extending to the level of the diaphragmatic hiatus. 2. No pneumonia, pulmonary edema, or pleural effusion. 3. Right middle lobe nodule measures 5 mm (previously, 4 mm). Given the relative stability, this is likely benign.   Aortic aneurysm NOS (ICD10-I71.9).     Electronically Signed   By: Rance Burrows M.D.   On: 06/15/2023 15:35       Risk Modification in those with ascending thoracic aortic aneurysm:   Continue good control of blood pressure (prefer SBP 130/80 or less)-continue Carvedilol (Coreg), Losartan (Cozaar), and Amlodipine (Norvasc)   2. Avoid fluoroquinolone antibiotics (I.e Ciprofloxacin, Avelox, Levofloxacin, Ofloxacin)   3.  Use of statin (to decrease cardiovascular risk)-continue Crestor (Rosuvastatin)   4.  Exercise and activity limitations is individualized, but in general, contact sports are to be avoided and one should avoid heavy lifting (defined as half of ideal body weight) and exercises involving sustained Valsalva maneuver.   5. Counseling for those suspected of having genetically mediated disease. First-degree relatives of those with TAA disease should be screened as well as those who have a connective tissue disease (I.e with Marfan syndrome, Ehlers-Danlos syndrome, and Loeys-Dietz syndrome) or a  bicuspid aortic valve,have an increased risk for complications related to TAA. She denies family history of TAA and personal history of connective tissue disorder.  Echo done 02/17/2021 showed LVEF 65-70%, aortic valve is tricuspid, trivial aortic valve regurgitation, and no aortic stenosis is present.    6. Patient does not have tobacco abuse history     Impression and Plan: CT of chest shows re  demonstrated tube graft repair of the ascending aorta, no perigraft fluid collection or inflammation, similar aneurysmal dilation of the aortic arch and descending aorta (was 4.6 cm 2024 and now 4.8 cm) extending to the level of the diaphragmatic hiatus.  Regarding right middle lobe nodule that now measures 5 mm (previously, 4 mm), she is followed by Dr. Thelda Finney. Given the relative stability, this is likely benign (per radiology). Echocardiogram done 02/17/2021 showed a tri leaflet valve with trivial aortic regurgitation.  We discussed the natural history and and risk factors for growth of ascending aortic aneurysms.  We covered the importance of tight blood pressure control, refraining from lifting heavy objects, and avoiding fluoroquinolones.  The patient is aware of signs and symptoms of aortic dissection and when to present to the emergency department.  We will continue surveillance and a repeat CTA of the chest was ordered for 6 months and per patient request, in person visit.     Allegra Arch, PA-C [facility] [phone]     I discussed limitations of evaluation and management via telephone.  The patient was advised to call back for repeat telephone consultation or to seek an in-person evaluation if questions arise or the patient's clinical condition changes in any significant manner.  I spent in excess of 20 minutes of non-face-to-face time during the conduct of this telephone virtual office consultation.  Level 1  (99441)             5-10 minutes Level 2  (99442)            11-20 minutes Level 3  (99443)            21-30 minutes   Lonnie Reth PA-C 06/16/2023 12:30 PM

## 2023-06-23 ENCOUNTER — Telehealth: Admitting: Thoracic Surgery (Cardiothoracic Vascular Surgery)

## 2023-06-26 ENCOUNTER — Encounter: Payer: Self-pay | Admitting: Physician Assistant

## 2023-06-26 ENCOUNTER — Ambulatory Visit (INDEPENDENT_AMBULATORY_CARE_PROVIDER_SITE_OTHER): Admitting: Physician Assistant

## 2023-06-26 DIAGNOSIS — I712 Thoracic aortic aneurysm, without rupture, unspecified: Secondary | ICD-10-CM | POA: Diagnosis not present

## 2023-11-27 ENCOUNTER — Encounter (HOSPITAL_BASED_OUTPATIENT_CLINIC_OR_DEPARTMENT_OTHER): Payer: Self-pay

## 2023-11-27 DIAGNOSIS — I10 Essential (primary) hypertension: Secondary | ICD-10-CM

## 2023-11-27 MED ORDER — AMLODIPINE BESYLATE 5 MG PO TABS: 5.0000 mg | ORAL_TABLET | Freq: Every day | ORAL | 1 refills | Status: AC

## 2023-11-29 ENCOUNTER — Other Ambulatory Visit: Payer: Self-pay | Admitting: Thoracic Surgery (Cardiothoracic Vascular Surgery)

## 2023-11-29 DIAGNOSIS — I7100 Dissection of unspecified site of aorta: Secondary | ICD-10-CM

## 2023-12-12 ENCOUNTER — Encounter (HOSPITAL_BASED_OUTPATIENT_CLINIC_OR_DEPARTMENT_OTHER): Payer: Self-pay | Admitting: Cardiovascular Disease

## 2023-12-22 ENCOUNTER — Ambulatory Visit (HOSPITAL_COMMUNITY)
Admission: RE | Admit: 2023-12-22 | Discharge: 2023-12-22 | Disposition: A | Discharge status: Home / Self Care | Source: Ambulatory Visit | Attending: Thoracic Surgery (Cardiothoracic Vascular Surgery) | Admitting: Thoracic Surgery (Cardiothoracic Vascular Surgery)

## 2023-12-22 DIAGNOSIS — R918 Other nonspecific abnormal finding of lung field: Secondary | ICD-10-CM | POA: Insufficient documentation

## 2023-12-22 DIAGNOSIS — I7102 Dissection of abdominal aorta: Secondary | ICD-10-CM | POA: Diagnosis not present

## 2023-12-22 DIAGNOSIS — J984 Other disorders of lung: Secondary | ICD-10-CM | POA: Diagnosis not present

## 2023-12-22 DIAGNOSIS — I7100 Dissection of unspecified site of aorta: Secondary | ICD-10-CM

## 2023-12-22 MED ORDER — IOHEXOL 350 MG/ML SOLN
75.0000 mL | Freq: Once | INTRAVENOUS | Status: AC | PRN
  Administered 2023-12-22: 75 mL via INTRAVENOUS

## 2024-01-05 ENCOUNTER — Ambulatory Visit

## 2024-01-05 VITALS — BP 128/70 | HR 66 | Resp 18 | Ht 70.0 in | Wt 200.0 lb

## 2024-01-05 DIAGNOSIS — I7121 Aneurysm of the ascending aorta, without rupture: Secondary | ICD-10-CM | POA: Diagnosis not present

## 2024-01-05 DIAGNOSIS — I7100 Dissection of unspecified site of aorta: Secondary | ICD-10-CM

## 2024-01-05 DIAGNOSIS — I7123 Aneurysm of the descending thoracic aorta, without rupture: Secondary | ICD-10-CM

## 2024-01-05 NOTE — Patient Instructions (Signed)
    Continue control of blood pressure (prefer BP 130/80 or less)   2. Avoid fluoroquinolone antibiotics (I.e Ciprofloxacin, Avelox, Levofloxacin, Ofloxacin)   3.  Use of statin (to decrease cardiovascular risk)   4.  Exercise and activity limitations is individualized, but in general, contact sports are to be avoided and one should avoid heavy lifting (defined as half of ideal body weight) and exercises involving sustained Valsalva maneuver.   5.  Follow-up in 6 months with CT chest.  OK to use a non-contrast CT if you have had a recent study for surveillance of the lung nodule.

## 2024-01-05 NOTE — Progress Notes (Signed)
 [address] [place] [address]             [phone]            SENAIDA CHILCOTE [id number] [birth year]   History of Present Illness:  Catherine Mcdonald is a 53 year old female with medical history of hypertension, SVT, type 1 dissection of ascending aorta, and hyperlipidemia who presents for continued follow up postoperatively for right axillary artery cannulation with 8 mm graft, antegrade cerebral perfusion, aortic valve re suspension, Type A aortic dissection repair with 28 mm graft type A dissection repair with hemi arch on 01/09/2021 by Dr. [name].  She has been followed every 6 months since surgery.  Recent CTA of chest showed an increase in size of aneurysmal dilation of the descending thoracic aorta.  It previously measured 5.3 cm and has increased to 5.5 cm.  She also had an increase in size of a pulmonary nodule, now measuring 1.2 cm in the left lower lobe.  Ascending thoracic aortic graft repair stable.  She reports that she has been doing okay recently.  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  She has been having some inspirational back pain and was evaluated at urgent care.  She does notice some shortness of breath with exertion.  She does have albuterol  to use as needed for this.  She reports that her home blood pressure readings are 110s-120/70s-80s.  She has noticed some weight gain since she has not been able to workout like she did prior to surgery.  She denies chest pain, shortness of breath and lower leg swelling.      Current Outpatient Medications on File Prior to Visit  Medication Sig Dispense Refill   albuterol  (VENTOLIN  HFA) 108 (90 Base) MCG/ACT inhaler Inhale 2 puffs into the lungs every 6 (six) hours as needed for wheezing or shortness of breath. 8 g 6   amLODipine  (NORVASC ) 5 MG tablet Take 1 tablet (5 mg total) by mouth daily. 90 tablet 1   aspirin  81 MG EC tablet Take 1 tablet (81 mg total) by  mouth daily. 30 tablet 1   carvedilol  (COREG ) 12.5 MG tablet Take 1 tablet (12.5 mg total) by mouth 2 (two) times daily with a meal. 180 tablet 3   losartan  (COZAAR ) 25 MG tablet Take 1 tablet (25 mg total) by mouth daily. 90 tablet 3   psyllium (METAMUCIL) 58.6 % powder Take 1 packet by mouth daily.     rosuvastatin  (CRESTOR ) 40 MG tablet Take 1 tablet (40 mg total) by mouth daily. 90 tablet 3   No current facility-administered medications on file prior to visit.     ROS: Review of Systems  Constitutional:  Negative for malaise/fatigue.  Respiratory:  Positive for shortness of breath. Negative for cough and wheezing.        With exertion  Cardiovascular:  Negative for chest pain, palpitations and leg swelling.  Musculoskeletal:  Positive for back pain.       Due to fall     BP 128/70 (BP Location: Left Arm)   Pulse 66   Resp 18   Ht 5' 10 (1.778 m)   Wt 200 lb (90.7 kg)   LMP 05/31/2012   SpO2 94%   BMI 28.70 kg/m   Physical Exam Constitutional:      Appearance: Normal appearance.  HENT:     Head: Normocephalic and atraumatic.  Cardiovascular:  Rate and Rhythm: Normal rate and regular rhythm.     Heart sounds: Normal heart sounds, S1 normal and S2 normal.  Pulmonary:     Effort: Pulmonary effort is normal.     Breath sounds: Normal breath sounds.  Musculoskeletal:     Cervical back: Normal range of motion.  Skin:    General: Skin is warm and dry.  Neurological:     General: No focal deficit present.     Mental Status: She is alert.      Imaging: EXAM: CTA CHEST 12/22/2023 11:43:04 AM   TECHNIQUE: CTA of the chest was performed without and with the administration of 75 mL of iohexol  (OMNIPAQUE ) 350 MG/ML injection. Multiplanar reformatted images are provided for review. MIP images are provided for review. Automated exposure control, iterative reconstruction, and/or weight based adjustment of the mA/kV was utilized to reduce the radiation dose to as low  as reasonably achievable.   COMPARISON: Chest CT dated 06/06/2023 and chest CT angiogram dated 07/01/2022.   CLINICAL HISTORY: Aortic aneurysm suspected; HX AORTIC DISSECTION. Aortic dissection.   FINDINGS:   PULMONARY ARTERIES: No acute pulmonary embolus. Main pulmonary artery is normal in caliber.   MEDIASTINUM: The heart and pericardium demonstrate no acute abnormality. Ascending thoracic aortic graft repair. Chronic aortic dissection involving the entire descending thoracic aorta and extending into the visualized upper abdominal aorta. There is worsening compression of the true lumen of the descending thoracic aorta compared to the 03/12/2021 CT angiogram with minimum luminal diameter of the true lumen 0.6 cm on series 605/image 103 (previously 1.0 cm). Aneurysmal dilatation of the descending thoracic aorta with maximum diameter of 5.5 cm (increased from 4.1 cm diameter on 03/12/2021 chest CT angiogram and 5.3 cm on 06/15/23 CT). Aortic arch branch vessels are patent.   LYMPH NODES: No mediastinal, hilar or axillary lymphadenopathy.   LUNGS AND PLEURA: Solid 0.6 cm medial right middle lobe nodule on series 302/image 86 is not substantially changed. Clustered indistinct nodules in the superior segment left lower lobe, largest 1.2 cm on series 302/image 52, increased from 0.9 cm and increased in density. Mild-to-moderate subsegmental atelectasis throughout the mid to lower lungs bilaterally. No focal consolidation. No evidence of pleural effusion or pneumothorax.   UPPER ABDOMEN: No acute abnormality.   SOFT TISSUES AND BONES: Intact sternotomy wires. Moderate thoracic spondylosis. No acute bone or soft tissue abnormality.   IMPRESSION: 1. Clustered indistinct nodules in the superior segment of the left lower lobe, largest 1.2 cm, increased from 0.9 cm and increased in density. These are indeterminate for chronic inflammatory process versus lung adenocarcinoma. Continued  chest CT follow-up advised at a minimum. 2. Chronic aortic dissection involving the entire descending thoracic aorta extending into the visualized upper abdominal aorta with worsening true lumen compression (minimum luminal diameter 0.6 cm, previously 1.0 cm in 2022). 3. Aneurysmal dilation of the descending thoracic aorta measuring up to 5.5 cm, increased from 5.3 cm on 06/15/23 CT and 4.1 cm on 03/12/21 CT.   Electronically signed by: Selinda Blue MD 12/22/2023 12:21 PM EDT RP Workstation: [id number]     A/P:  Dissection of aorta, unspecified portion of aorta (HCC) -We reviewed CTA of chest. Ascending thoracic aortic graft repair intact and aortic arch branch vessels are patent.  There is worsening compression of the true lumen in the descending thoracic aorta. -Patient was counseled on importance of Blood Pressure Control. They are instructed to contact their PCP or cardiologist if they start to have blood pressure readings over  130s/90s. Do not ever stop blood pressure medications on your own, unless instructed by healthcare professional. Continue to check BP at home and record readings -Lifting restrictions discussed -Continue with rosuvastatin   Aneurysm of the descending thoracic aorta, without rupture -5.5 cm aneurysmal dilation of the descending thoracic aorta.  This has increased in size over the past 6 month from 5.3 cm -Due to increase in size referral placed to vascular surgery   -Continue with blood pressure control  Pulmonary Nodules -Reveiwed CTA scan with Dr. [name]  -Right middle lobe nodule 0.6 cm which is not changed -Nodules in the superior segment of the left lower lobe have increased in size and density -Follow up in 6 months with CT for continued surveillance      Manuelita CHRISTELLA Rough, PA-C 01/05/24

## 2024-01-11 NOTE — Progress Notes (Unsigned)
 Patient ID: Catherine Mcdonald, female   DOB: 1970-12-26, 53 y.o.   MRN: [id number]  Reason for Consult: No chief complaint on file.   Referred by Rutha Manuelita HERO, PA-C  Subjective:     HPI Catherine Mcdonald is a 53 y.o. 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 [name] in October 2022 with a 28 mm graft and a hemiarch repair.  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  Past Medical History:  Diagnosis Date   Allergy    Anemia    had ekg/stress test/echo done in March at [facility] d/t symptoms of anemia-cardiac r/o per pt-records requested   Arthritis    Essential hypertension 02/11/2021   Headache(784.0)    History of blood transfusion 05/2012   [facility]   Hyperlipidemia    Hypertension    meds d/c about 1 year ago after starting exercise program with b/p controlled-pcp aware   Menorrhagia 03/01/2012   Pure hypercholesterolemia 02/11/2021   Seizures (HCC)    as an infant with high fevers   Sickle cell trait    SVT (supraventricular tachycardia) 10/26/2021   Family History  Problem Relation Age of Onset   Hyperlipidemia Mother    Hypertension Mother    Diabetes Maternal Grandmother    Hypertension Maternal Grandmother    Colon cancer Neg Hx    Esophageal cancer Neg Hx    Rectal cancer Neg Hx    Stomach cancer Neg Hx    Past Surgical History:  Procedure Laterality Date   CESAREAN SECTION     TEE WITHOUT CARDIOVERSION N/A 01/08/2021   Procedure: TRANSESOPHAGEAL ECHOCARDIOGRAM (TEE);  Surgeon: [name] Linnie KIDD, MD;  Location: [facility] OR;  Service: Open Heart Surgery;  Laterality: N/A;   THORACIC AORTIC ANEURYSM REPAIR N/A 01/08/2021   Procedure: Repair of Acute Ascending Thoracic Aortic Dissection Using Hemashield Platinum Graft Size ;  Surgeon: [name] Linnie KIDD, MD;  Location: [facility] OR;  Service: Open Heart Surgery;  Laterality: N/A;   VAGINAL HYSTERECTOMY N/A 07/12/2012    Procedure: HYSTERECTOMY VAGINAL;  Surgeon: [name], MD;  Location: [facility] ORS;  Service: Gynecology;  Laterality: N/A;   WISDOM TOOTH EXTRACTION      Short Social History:  Social History   Tobacco Use   Smoking status: Never    Passive exposure: Never   Smokeless tobacco: Never  Substance Use Topics   Alcohol use: Not Currently    Comment: RARE    Allergies  Allergen Reactions   Quinolones     Patient has had aortic dissection repair and is being followed for an aneurysm;should avoid fluoroquinolone antibiotics    Current Outpatient Medications  Medication Sig Dispense Refill   albuterol  (VENTOLIN  HFA) 108 (90 Base) MCG/ACT inhaler Inhale 2 puffs into the lungs every 6 (six) hours as needed for wheezing or shortness of breath. 8 g 6   amLODipine  (NORVASC ) 5 MG tablet Take 1 tablet (5 mg total) by mouth daily. 90 tablet 1   aspirin  81 MG EC tablet Take 1 tablet (81 mg total) by mouth daily. 30 tablet 1   carvedilol  (COREG ) 12.5 MG tablet Take 1 tablet (12.5 mg total) by mouth 2 (two) times daily with a meal. 180 tablet 3   losartan  (COZAAR ) 25 MG tablet Take 1 tablet (25 mg total) by mouth daily. 90 tablet 3   psyllium (METAMUCIL) 58.6 % powder Take 1 packet by mouth daily.  rosuvastatin  (CRESTOR ) 40 MG tablet Take 1 tablet (40 mg total) by mouth daily. 90 tablet 3   No current facility-administered medications for this visit.    REVIEW OF SYSTEMS  All other systems were reviewed and are negative     Objective:  Objective   There were no vitals filed for this visit. There is no height or weight on file to calculate BMI.  Physical Exam General: no acute distress Cardiac: hemodynamically stable Abdomen: non-tender, no pulsatile mass*** Extremities: no edema, cyanosis or wounds*** Vascular:   Right: ***  Left: ***  Data: CTA reviewed False lumen aneurysmal degeneration of the descending thoracic aorta measuring approximately 5.5 cm in maximal diameter.   This was previously about 4.7 in April 2024.     Assessment/Plan:   Catherine Mcdonald is a 53 y.o. female with history of a type a dissection in 2022 that was repaired by Dr. [name].  She has residual dissection throughout the descending thoracic and abdominal aorta and has been in annual surveillance with CT surgery.  She has had slow aneurysmal degeneration of the false lumen that now measures approximately 5.5 in the descending thoracic aorta.  We discussed that eventually this would need to be repaired.  We discussed the threshold of repair in the thoracic aorta being 5.5 to 6 cm.  I explained that she currently meets threshold although I would like to obtain a repeat CTA of the chest abdomen pelvis in order to fully visualize the extent of the dissection and false lumen.   She is likely going to require TEVAR with false lumen embolization.  Plan for repeat CTA within the next 6 to 8 weeks and will discuss surgical planning at that time.   [name] MD [facility]

## 2024-01-12 ENCOUNTER — Ambulatory Visit: Attending: Vascular Surgery | Admitting: Vascular Surgery

## 2024-01-12 ENCOUNTER — Encounter: Payer: Self-pay | Admitting: Vascular Surgery

## 2024-01-12 VITALS — BP 142/80 | HR 62 | Temp 97.3°F | Resp 18 | Ht 70.0 in | Wt 202.3 lb

## 2024-01-12 DIAGNOSIS — I7123 Aneurysm of the descending thoracic aorta, without rupture: Secondary | ICD-10-CM | POA: Diagnosis not present

## 2024-01-12 DIAGNOSIS — I7101 Dissection of ascending aorta: Secondary | ICD-10-CM | POA: Diagnosis not present

## 2024-02-12 ENCOUNTER — Other Ambulatory Visit: Payer: Self-pay

## 2024-02-12 DIAGNOSIS — I7123 Aneurysm of the descending thoracic aorta, without rupture: Secondary | ICD-10-CM

## 2024-02-21 ENCOUNTER — Encounter (HOSPITAL_BASED_OUTPATIENT_CLINIC_OR_DEPARTMENT_OTHER): Payer: Self-pay | Admitting: Cardiovascular Disease

## 2024-02-29 ENCOUNTER — Inpatient Hospital Stay: Admission: RE | Admit: 2024-02-29 | Discharge: 2024-02-29 | Attending: Vascular Surgery

## 2024-02-29 DIAGNOSIS — I7123 Aneurysm of the descending thoracic aorta, without rupture: Secondary | ICD-10-CM

## 2024-02-29 MED ADMIN — Iopamidol IV Soln 76%: 75 mL | INTRAVENOUS | @ 10:00:00 | NDC 00270131652

## 2024-03-20 NOTE — Progress Notes (Signed)
 "  Patient ID: Catherine Mcdonald, female   DOB: 1970/03/23, 54 y.o.   MRN: [id number]  Reason for Consult: Follow-up   Referred by Aletha Bene, Mcdonald  Subjective:     HPI Catherine Mcdonald is a 54 y.o. 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 [name] in October 2022 with a 28 mm graft and a hemiarch repair.  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  She is presenting for follow-up with a new CT scan.  She is a never smoker.  Past Medical History:  Diagnosis Date   AAA (abdominal aortic aneurysm)    Allergy    Anemia    had ekg/stress test/echo done in March at [facility] d/t symptoms of anemia-cardiac r/o per pt-records requested   Arthritis    Essential hypertension 02/11/2021   Headache(784.0)    History of blood transfusion 05/2012   [facility]   Hyperlipidemia    Hypertension    meds d/c about 1 year ago after starting exercise program with b/p controlled-pcp aware   Menorrhagia 03/01/2012   Pure hypercholesterolemia 02/11/2021   Seizures (HCC)    as an infant with high fevers   Sickle cell trait    SVT (supraventricular tachycardia) 10/26/2021   Family History  Problem Relation Age of Onset   Hyperlipidemia Mother    Hypertension Mother    Diabetes Maternal Grandmother    Hypertension Maternal Grandmother    Colon cancer Neg Hx    Esophageal cancer Neg Hx    Rectal cancer Neg Hx    Stomach cancer Neg Hx    Past Surgical History:  Procedure Laterality Date   CESAREAN SECTION     TEE WITHOUT CARDIOVERSION N/A 01/08/2021   Procedure: TRANSESOPHAGEAL ECHOCARDIOGRAM (TEE);  Surgeon: [name] Linnie KIDD, Mcdonald;  Location: [facility] OR;  Service: Open Heart Surgery;  Laterality: N/A;   THORACIC AORTIC ANEURYSM REPAIR N/A 01/08/2021   Procedure: Repair of Acute Ascending Thoracic Aortic Dissection Using Hemashield Platinum Graft Size ;  Surgeon: [name] Linnie KIDD, Mcdonald;  Location: [facility]  OR;  Service: Open Heart Surgery;  Laterality: N/A;   VAGINAL HYSTERECTOMY N/A 07/12/2012   Procedure: HYSTERECTOMY VAGINAL;  Surgeon: [name], Mcdonald;  Location: [facility] ORS;  Service: Gynecology;  Laterality: N/A;   WISDOM TOOTH EXTRACTION      Short Social History:  Social History   Tobacco Use   Smoking status: Never    Passive exposure: Never   Smokeless tobacco: Never  Substance Use Topics   Alcohol use: Not Currently    Comment: RARE    Allergies  Allergen Reactions   Quinolones     Patient has had aortic dissection repair and is being followed for an aneurysm;should avoid fluoroquinolone antibiotics    Current Outpatient Medications  Medication Sig Dispense Refill   albuterol  (VENTOLIN  HFA) 108 (90 Base) MCG/ACT inhaler Inhale 2 puffs into the lungs every 6 (six) hours as needed for wheezing or shortness of breath. 8 g 6   amLODipine  (NORVASC ) 5 MG tablet Take 1 tablet (5 mg total) by mouth daily. 90 tablet 1   aspirin  81 MG EC tablet Take 1 tablet (81 mg total) by mouth daily. 30 tablet 1   carvedilol  (COREG ) 12.5 MG tablet Take 1 tablet (12.5 mg total) by mouth 2 (two) times daily with a meal. 180 tablet 3   losartan  (COZAAR ) 25 MG tablet Take 1 tablet (25 mg total) by  mouth daily. 90 tablet 3   psyllium (METAMUCIL) 58.6 % powder Take 1 packet by mouth daily.     rosuvastatin  (CRESTOR ) 40 MG tablet Take 1 tablet (40 mg total) by mouth daily. 90 tablet 3   No current facility-administered medications for this visit.    REVIEW OF SYSTEMS  All other systems were reviewed and are negative     Objective:  Objective   Vitals:   03/22/24 0822  BP: 109/67  Pulse: 63  Resp: 16  Temp: (!) 97.2 F (36.2 C)  TempSrc: Temporal  SpO2: 99%  Weight: 201 lb 11.2 oz (91.5 kg)  Height: 5' 10 (1.778 m)    Body mass index is 28.94 kg/m.  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  Stable from prior scan 3 months ago.     Assessment/Plan:   DISA RIEDLINGER is a 54 y.o. female with history of a type a dissection in 2022 that was repaired by Dr. [name].  She has residual dissection throughout the descending thoracic and abdominal aorta and has been in annual surveillance with CT surgery.  She has had slow aneurysmal degeneration of the false lumen that now measures approximately 5.5 by 6 cm in the descending thoracic aorta.  We discussed that eventually this would need to be repaired.  I explained that she is essentially right at the threshold for repair. We again discussed the repair she would need is a TEVAR with false lumen embolization. We discussed the risks and benefits.  I explained the small risk of spinal cord ischemia although the given that she is young with widely patent subclavian's and hypogastric arteries I do think that risk is low.  We also discussed the small risk of access complication requiring a femoral cutdown.  Will plan for TEVAR with false lumen embolization scheduled at her convenience    Norman Catherine Mcdonald [facility]  "

## 2024-03-22 ENCOUNTER — Encounter: Payer: Self-pay | Admitting: Vascular Surgery

## 2024-03-22 ENCOUNTER — Ambulatory Visit: Attending: Vascular Surgery | Admitting: Vascular Surgery

## 2024-03-22 VITALS — BP 109/67 | HR 63 | Temp 97.2°F | Resp 16 | Ht 70.0 in | Wt 201.7 lb

## 2024-03-22 DIAGNOSIS — I7123 Aneurysm of the descending thoracic aorta, without rupture: Secondary | ICD-10-CM

## 2024-03-22 DIAGNOSIS — I7101 Dissection of ascending aorta: Secondary | ICD-10-CM | POA: Diagnosis not present

## 2024-03-25 ENCOUNTER — Other Ambulatory Visit: Payer: Self-pay

## 2024-03-25 DIAGNOSIS — I7123 Aneurysm of the descending thoracic aorta, without rupture: Secondary | ICD-10-CM

## 2024-04-04 ENCOUNTER — Other Ambulatory Visit (HOSPITAL_BASED_OUTPATIENT_CLINIC_OR_DEPARTMENT_OTHER): Payer: Self-pay | Admitting: Family

## 2024-04-04 DIAGNOSIS — E782 Mixed hyperlipidemia: Secondary | ICD-10-CM

## 2024-04-11 NOTE — Telephone Encounter (Signed)
 In accordance with refill protocols, please review and address the following requirements before this medication refill can be authorized:  Labs  Pt needs a lipid panel done. Pt has an upcoming appt in May 2026 with Dr Raford

## 2024-04-16 ENCOUNTER — Encounter (HOSPITAL_BASED_OUTPATIENT_CLINIC_OR_DEPARTMENT_OTHER): Payer: Self-pay | Admitting: Cardiovascular Disease

## 2024-04-16 ENCOUNTER — Encounter: Payer: Self-pay | Admitting: Vascular Surgery

## 2024-04-19 ENCOUNTER — Emergency Department (HOSPITAL_COMMUNITY)

## 2024-04-19 ENCOUNTER — Other Ambulatory Visit: Payer: Self-pay

## 2024-04-19 ENCOUNTER — Encounter (HOSPITAL_COMMUNITY): Payer: Self-pay

## 2024-04-19 ENCOUNTER — Ambulatory Visit: Admitting: Vascular Surgery

## 2024-04-19 ENCOUNTER — Emergency Department (HOSPITAL_COMMUNITY): Admission: EM | Admit: 2024-04-19 | Source: Home / Self Care

## 2024-04-19 DIAGNOSIS — I7123 Aneurysm of the descending thoracic aorta, without rupture: Secondary | ICD-10-CM

## 2024-04-19 LAB — I-STAT CHEM 8, ED
BUN: 16 mg/dL (ref 6–20)
Calcium, Ion: 1.12 mmol/L — ABNORMAL LOW (ref 1.15–1.40)
Chloride: 103 mmol/L (ref 98–111)
Creatinine, Ser: 1 mg/dL (ref 0.44–1.00)
Glucose, Bld: 112 mg/dL — ABNORMAL HIGH (ref 70–99)
HCT: 43 % (ref 36.0–46.0)
Hemoglobin: 14.6 g/dL (ref 12.0–15.0)
Potassium: 3.7 mmol/L (ref 3.5–5.1)
Sodium: 142 mmol/L (ref 135–145)
TCO2: 27 mmol/L (ref 22–32)

## 2024-04-19 LAB — CBC WITH DIFFERENTIAL/PLATELET
Abs Immature Granulocytes: 0.01 10*3/uL (ref 0.00–0.07)
Basophils Absolute: 0 10*3/uL (ref 0.0–0.1)
Basophils Relative: 0 %
Eosinophils Absolute: 0.2 10*3/uL (ref 0.0–0.5)
Eosinophils Relative: 2 %
HCT: 42.5 % (ref 36.0–46.0)
Hemoglobin: 13.7 g/dL (ref 12.0–15.0)
Immature Granulocytes: 0 %
Lymphocytes Relative: 22 %
Lymphs Abs: 1.7 10*3/uL (ref 0.7–4.0)
MCH: 26.2 pg (ref 26.0–34.0)
MCHC: 32.2 g/dL (ref 30.0–36.0)
MCV: 81.3 fL (ref 80.0–100.0)
Monocytes Absolute: 0.9 10*3/uL (ref 0.1–1.0)
Monocytes Relative: 12 %
Neutro Abs: 4.9 10*3/uL (ref 1.7–7.7)
Neutrophils Relative %: 64 %
Platelets: 249 10*3/uL (ref 150–400)
RBC: 5.23 MIL/uL — ABNORMAL HIGH (ref 3.87–5.11)
RDW: 15.1 % (ref 11.5–15.5)
WBC: 7.6 10*3/uL (ref 4.0–10.5)
nRBC: 0 % (ref 0.0–0.2)

## 2024-04-19 LAB — BASIC METABOLIC PANEL WITH GFR
Anion gap: 12 (ref 5–15)
BUN: 16 mg/dL (ref 6–20)
CO2: 25 mmol/L (ref 22–32)
Calcium: 8.9 mg/dL (ref 8.9–10.3)
Chloride: 105 mmol/L (ref 98–111)
Creatinine, Ser: 0.94 mg/dL (ref 0.44–1.00)
GFR, Estimated: >60 mL/min
Glucose, Bld: 111 mg/dL — ABNORMAL HIGH (ref 70–99)
Potassium: 4 mmol/L (ref 3.5–5.1)
Sodium: 142 mmol/L (ref 135–145)

## 2024-04-19 LAB — TROPONIN T, HIGH SENSITIVITY
Troponin T High Sensitivity: <6 ng/L (ref 0–19)
Troponin T High Sensitivity: 6 ng/L (ref 0–19)

## 2024-04-19 MED ORDER — IOHEXOL 350 MG/ML SOLN
75.0000 mL | Freq: Once | INTRAVENOUS | Status: AC | PRN
  Administered 2024-04-19: 75 mL via INTRAVENOUS

## 2024-04-19 NOTE — ED Provider Triage Note (Signed)
 Emergency Medicine Provider Triage Evaluation Note  Catherine Mcdonald , a 54 y.o. female  was evaluated in triage.  Pt complains of shoulder pain.  Patient reports that she has had some new pain in her right shoulder since Tuesday.  However over the past few days the pain has gotten a little bit worse.  She does have a positive history for a aortic aneurysm.  She was seen at urgent care and they recommended that she come to the ER to get a CT.  On physical exam patient is able to move her arm with some pain with extension and flexion.  Patient denies any chest pain or shortness of breath however does have tenderness to palpation on her right shoulder towards the midline  Review of Systems  Positive: Shoulder pain Negative:   Physical Exam  BP (!) 161/94 (BP Location: Right Arm)   Pulse 77   Temp 99 F (37.2 C) (Oral)   Resp 18   Ht 5' 10 (1.778 m)   Wt 92.5 kg   LMP 05/31/2012   SpO2 99%   BMI 29.26 kg/m  Gen:   Awake, no distress   Resp:  Normal effort  MSK:   Moves extremities without difficulty  Other:    Medical Decision Making  Medically screening exam initiated at 6:41 PM.  Appropriate orders placed.  Catherine Mcdonald was informed that the remainder of the evaluation will be completed by another provider, this initial triage assessment does not replace that evaluation, and the importance of remaining in the ED until their evaluation is complete.     Catherine Mcdonald, [place] 04/19/24 1844

## 2024-04-19 NOTE — Progress Notes (Signed)
" ° °  Patient ID: Catherine Mcdonald, female   DOB: 25-Jun-1970, 54 y.o.   MRN: [id number]   Documentation of a phone visit to address the patient's questions regarding the surgery, risks and durability.  All questions were answered.  I also offered to send her for a second opinion with Dr. Missy at [facility].  I encouraged her to think about this and call our office on Monday if she is interested.  If not we will plan to proceed with her case on 2/12.   [name] MD [facility]  "

## 2024-04-19 NOTE — ED Triage Notes (Signed)
 Patient arrives POV for back pain x 2 days. Patient was seen at [facility] and advised to be evaluated at ER. Patient has hx of aortic aneurysm. No radiation. Patient endorses pain is worsened with inhalation. No dizziness currently but reports fatigue.

## 2024-04-19 NOTE — ED Triage Notes (Signed)
 Patient reports upper back pain x 2 days, starting after a work out on Wednesday, got worse last night and today decided to get checked at [facility], then they sent her down here. Patient reports the pain is worse with movement, especially when she turns her head. Patient states is different from her previous pain.

## 2024-04-23 ENCOUNTER — Other Ambulatory Visit (HOSPITAL_COMMUNITY)

## 2024-04-25 ENCOUNTER — Inpatient Hospital Stay (HOSPITAL_COMMUNITY): Admission: RE | Admit: 2024-04-25 | Admitting: Vascular Surgery

## 2024-04-25 ENCOUNTER — Encounter (HOSPITAL_COMMUNITY): Admission: RE | Payer: Self-pay | Source: Home / Self Care

## 2024-04-25 SURGERY — INSERTION, ENDOVASCULAR STENT GRAFT, AORTA, ABDOMINAL
Anesthesia: General

## 2024-07-30 ENCOUNTER — Ambulatory Visit (HOSPITAL_BASED_OUTPATIENT_CLINIC_OR_DEPARTMENT_OTHER): Admitting: Cardiovascular Disease
